# Patient Record
Sex: Female | Born: 1950 | Race: White | Hispanic: No | Marital: Married | Smoking: Former smoker
Health system: Southern US, Community
[De-identification: ages and names within clinical notes are randomized; demographics above are authoritative.]

## PROBLEM LIST (undated history)

## (undated) DIAGNOSIS — I447 Left bundle-branch block, unspecified: Secondary | ICD-10-CM

## (undated) DIAGNOSIS — E039 Hypothyroidism, unspecified: Secondary | ICD-10-CM

## (undated) DIAGNOSIS — Z9289 Personal history of other medical treatment: Secondary | ICD-10-CM

## (undated) DIAGNOSIS — I428 Other cardiomyopathies: Secondary | ICD-10-CM

## (undated) DIAGNOSIS — I5022 Chronic systolic (congestive) heart failure: Secondary | ICD-10-CM

## (undated) DIAGNOSIS — I951 Orthostatic hypotension: Secondary | ICD-10-CM

## (undated) DIAGNOSIS — F32A Depression, unspecified: Secondary | ICD-10-CM

## (undated) DIAGNOSIS — J189 Pneumonia, unspecified organism: Secondary | ICD-10-CM

## (undated) DIAGNOSIS — F329 Major depressive disorder, single episode, unspecified: Secondary | ICD-10-CM

## (undated) DIAGNOSIS — K759 Inflammatory liver disease, unspecified: Secondary | ICD-10-CM

## (undated) DIAGNOSIS — C8190 Hodgkin lymphoma, unspecified, unspecified site: Secondary | ICD-10-CM

## (undated) DIAGNOSIS — H9190 Unspecified hearing loss, unspecified ear: Secondary | ICD-10-CM

## (undated) DIAGNOSIS — D334 Benign neoplasm of spinal cord: Secondary | ICD-10-CM

## (undated) DIAGNOSIS — K219 Gastro-esophageal reflux disease without esophagitis: Secondary | ICD-10-CM

## (undated) HISTORY — PX: KNEE ARTHROSCOPY: SHX127

## (undated) HISTORY — DX: Other cardiomyopathies: I42.8

## (undated) HISTORY — DX: Orthostatic hypotension: I95.1

## (undated) HISTORY — PX: LAPAROSCOPIC CHOLECYSTECTOMY: SUR755

## (undated) HISTORY — PX: HYSTEROSCOPY W/D&C: SHX1775

## (undated) HISTORY — PX: DILATION AND CURETTAGE OF UTERUS: SHX78

## (undated) HISTORY — DX: Benign neoplasm of spinal cord: D33.4

## (undated) HISTORY — PX: COLONOSCOPY W/ POLYPECTOMY: SHX1380

## (undated) HISTORY — PX: BACK SURGERY: SHX140

---
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
## 1973-05-02 HISTORY — PX: ABDOMINAL EXPLORATION SURGERY: SHX538

## 1973-05-02 HISTORY — PX: NECK LESION BIOPSY: SHX2078

## 1998-06-25 ENCOUNTER — Ambulatory Visit (HOSPITAL_COMMUNITY): Admission: RE | Admit: 1998-06-25 | Discharge: 1998-06-25 | Payer: Self-pay | Admitting: Gynecology

## 1998-11-17 ENCOUNTER — Other Ambulatory Visit: Admission: RE | Admit: 1998-11-17 | Discharge: 1998-11-17 | Payer: Self-pay | Admitting: Gynecology

## 1998-12-15 ENCOUNTER — Observation Stay (HOSPITAL_COMMUNITY): Admission: RE | Admit: 1998-12-15 | Discharge: 1998-12-16 | Payer: Self-pay | Admitting: Gynecology

## 1999-12-10 ENCOUNTER — Encounter: Admission: RE | Admit: 1999-12-10 | Discharge: 1999-12-10 | Payer: Self-pay | Admitting: Internal Medicine

## 1999-12-10 ENCOUNTER — Encounter: Payer: Self-pay | Admitting: Internal Medicine

## 2000-10-23 ENCOUNTER — Inpatient Hospital Stay (HOSPITAL_COMMUNITY): Admission: EM | Admit: 2000-10-23 | Discharge: 2000-10-25 | Payer: Self-pay | Admitting: *Deleted

## 2000-10-23 ENCOUNTER — Encounter: Payer: Self-pay | Admitting: Emergency Medicine

## 2000-10-24 ENCOUNTER — Encounter: Payer: Self-pay | Admitting: Cardiology

## 2000-10-24 ENCOUNTER — Encounter: Payer: Self-pay | Admitting: Emergency Medicine

## 2000-11-30 ENCOUNTER — Other Ambulatory Visit: Admission: RE | Admit: 2000-11-30 | Discharge: 2000-11-30 | Payer: Self-pay | Admitting: Gynecology

## 2000-12-20 ENCOUNTER — Encounter: Payer: Self-pay | Admitting: Gynecology

## 2000-12-20 ENCOUNTER — Encounter: Admission: RE | Admit: 2000-12-20 | Discharge: 2000-12-20 | Payer: Self-pay | Admitting: Gynecology

## 2001-01-09 ENCOUNTER — Inpatient Hospital Stay (HOSPITAL_COMMUNITY): Admission: EM | Admit: 2001-01-09 | Discharge: 2001-01-15 | Payer: Self-pay | Admitting: Emergency Medicine

## 2001-01-09 ENCOUNTER — Encounter: Payer: Self-pay | Admitting: Internal Medicine

## 2001-01-10 ENCOUNTER — Encounter: Payer: Self-pay | Admitting: Internal Medicine

## 2001-07-19 ENCOUNTER — Encounter: Admission: RE | Admit: 2001-07-19 | Discharge: 2001-07-19 | Payer: Self-pay | Admitting: Internal Medicine

## 2001-07-19 ENCOUNTER — Encounter: Payer: Self-pay | Admitting: Internal Medicine

## 2001-07-26 ENCOUNTER — Encounter: Payer: Self-pay | Admitting: Internal Medicine

## 2001-07-26 ENCOUNTER — Encounter: Admission: RE | Admit: 2001-07-26 | Discharge: 2001-07-26 | Payer: Self-pay | Admitting: Internal Medicine

## 2001-09-17 ENCOUNTER — Inpatient Hospital Stay (HOSPITAL_COMMUNITY): Admission: EM | Admit: 2001-09-17 | Discharge: 2001-09-19 | Payer: Self-pay | Admitting: Emergency Medicine

## 2001-09-17 ENCOUNTER — Encounter: Payer: Self-pay | Admitting: Internal Medicine

## 2002-03-15 ENCOUNTER — Encounter: Admission: RE | Admit: 2002-03-15 | Discharge: 2002-03-15 | Payer: Self-pay | Admitting: Gynecology

## 2002-03-15 ENCOUNTER — Encounter: Payer: Self-pay | Admitting: Gynecology

## 2002-04-15 ENCOUNTER — Other Ambulatory Visit: Admission: RE | Admit: 2002-04-15 | Discharge: 2002-04-15 | Payer: Self-pay | Admitting: Obstetrics and Gynecology

## 2002-04-29 ENCOUNTER — Encounter: Payer: Self-pay | Admitting: Obstetrics and Gynecology

## 2002-04-29 ENCOUNTER — Ambulatory Visit (HOSPITAL_COMMUNITY): Admission: RE | Admit: 2002-04-29 | Discharge: 2002-04-29 | Payer: Self-pay | Admitting: Obstetrics and Gynecology

## 2002-05-07 ENCOUNTER — Encounter: Payer: Self-pay | Admitting: Obstetrics and Gynecology

## 2002-05-07 ENCOUNTER — Ambulatory Visit (HOSPITAL_COMMUNITY): Admission: RE | Admit: 2002-05-07 | Discharge: 2002-05-07 | Payer: Self-pay | Admitting: Obstetrics and Gynecology

## 2002-06-26 ENCOUNTER — Encounter (INDEPENDENT_AMBULATORY_CARE_PROVIDER_SITE_OTHER): Payer: Self-pay

## 2002-06-26 ENCOUNTER — Ambulatory Visit (HOSPITAL_COMMUNITY): Admission: RE | Admit: 2002-06-26 | Discharge: 2002-06-26 | Payer: Self-pay | Admitting: Obstetrics and Gynecology

## 2002-07-08 ENCOUNTER — Ambulatory Visit (HOSPITAL_COMMUNITY): Admission: RE | Admit: 2002-07-08 | Discharge: 2002-07-08 | Payer: Self-pay | Admitting: Gastroenterology

## 2002-07-08 ENCOUNTER — Encounter (INDEPENDENT_AMBULATORY_CARE_PROVIDER_SITE_OTHER): Payer: Self-pay | Admitting: *Deleted

## 2003-03-19 ENCOUNTER — Encounter: Admission: RE | Admit: 2003-03-19 | Discharge: 2003-03-19 | Payer: Self-pay | Admitting: Obstetrics and Gynecology

## 2003-04-11 ENCOUNTER — Ambulatory Visit (HOSPITAL_BASED_OUTPATIENT_CLINIC_OR_DEPARTMENT_OTHER): Admission: RE | Admit: 2003-04-11 | Discharge: 2003-04-11 | Payer: Self-pay | Admitting: Orthopedic Surgery

## 2003-04-11 ENCOUNTER — Encounter (INDEPENDENT_AMBULATORY_CARE_PROVIDER_SITE_OTHER): Payer: Self-pay | Admitting: *Deleted

## 2003-04-11 ENCOUNTER — Ambulatory Visit (HOSPITAL_COMMUNITY): Admission: RE | Admit: 2003-04-11 | Discharge: 2003-04-11 | Payer: Self-pay | Admitting: Orthopedic Surgery

## 2003-05-09 ENCOUNTER — Other Ambulatory Visit: Admission: RE | Admit: 2003-05-09 | Discharge: 2003-05-09 | Payer: Self-pay | Admitting: Obstetrics and Gynecology

## 2004-04-08 ENCOUNTER — Encounter: Admission: RE | Admit: 2004-04-08 | Discharge: 2004-04-08 | Payer: Self-pay | Admitting: Obstetrics and Gynecology

## 2004-05-25 ENCOUNTER — Other Ambulatory Visit: Admission: RE | Admit: 2004-05-25 | Discharge: 2004-05-25 | Payer: Self-pay | Admitting: Obstetrics and Gynecology

## 2004-08-06 ENCOUNTER — Emergency Department (HOSPITAL_COMMUNITY): Admission: EM | Admit: 2004-08-06 | Discharge: 2004-08-06 | Payer: Self-pay | Admitting: *Deleted

## 2005-05-06 ENCOUNTER — Encounter: Admission: RE | Admit: 2005-05-06 | Discharge: 2005-05-06 | Payer: Self-pay | Admitting: Obstetrics and Gynecology

## 2005-05-27 ENCOUNTER — Other Ambulatory Visit: Admission: RE | Admit: 2005-05-27 | Discharge: 2005-05-27 | Payer: Self-pay | Admitting: Obstetrics and Gynecology

## 2005-10-14 ENCOUNTER — Encounter: Admission: RE | Admit: 2005-10-14 | Discharge: 2005-10-14 | Payer: Self-pay | Admitting: Internal Medicine

## 2006-05-08 ENCOUNTER — Encounter: Admission: RE | Admit: 2006-05-08 | Discharge: 2006-05-08 | Payer: Self-pay | Admitting: Obstetrics and Gynecology

## 2006-06-30 ENCOUNTER — Ambulatory Visit: Payer: Self-pay | Admitting: Oncology

## 2006-08-01 ENCOUNTER — Emergency Department (HOSPITAL_COMMUNITY): Admission: EM | Admit: 2006-08-01 | Discharge: 2006-08-01 | Payer: Self-pay | Admitting: Emergency Medicine

## 2007-05-15 ENCOUNTER — Encounter: Admission: RE | Admit: 2007-05-15 | Discharge: 2007-05-15 | Payer: Self-pay | Admitting: Obstetrics and Gynecology

## 2008-05-20 ENCOUNTER — Encounter: Admission: RE | Admit: 2008-05-20 | Discharge: 2008-05-20 | Payer: Self-pay | Admitting: Internal Medicine

## 2009-05-21 ENCOUNTER — Encounter: Admission: RE | Admit: 2009-05-21 | Discharge: 2009-05-21 | Payer: Self-pay | Admitting: Internal Medicine

## 2010-03-15 ENCOUNTER — Encounter
Admission: RE | Admit: 2010-03-15 | Discharge: 2010-03-15 | Payer: Self-pay | Source: Home / Self Care | Attending: Obstetrics and Gynecology | Admitting: Obstetrics and Gynecology

## 2010-05-24 ENCOUNTER — Encounter
Admission: RE | Admit: 2010-05-24 | Discharge: 2010-05-24 | Payer: Self-pay | Source: Home / Self Care | Attending: Internal Medicine | Admitting: Internal Medicine

## 2010-09-17 NOTE — H&P (Signed)
NAME:  Sylvia Moore, ROHMAN                         ACCOUNT NO.:  [id number]   MEDICAL RECORD NO.:  [id number]                   PATIENT TYPE:  AMB   LOCATION:  SDC                                  FACILITY:  [facility]   PHYSICIAN:  Naima A. Dillard, M.D.              DATE OF BIRTH:  26-Sep-1950   DATE OF ADMISSION:  DATE OF DISCHARGE:                                HISTORY & PHYSICAL   CHIEF COMPLAINT:  Postmenopausal vaginal bleeding.   HISTORY OF PRESENT ILLNESS:  The patient is a 60 year old white female  gravida 0 who presented to me April 15, 2002 complaining of intermittent  bleeding for the last couple of weeks and stated that she also had a past  six months prior.  The patient states she has been on Prometrium and Climara  for about three to four years and has not missed any doses, which were given  to her by a prior Ob-Gyn.  The patient states she had an endometrial biopsy,  but does not know the results.  The patient also states that she tried to  have a D&C hysteroscopy, but it was unsuccessful due to cervical stenosis.  The patient has a sonohystogram, which was significant for a 7 mm  endometrial polyp at the fundus.   PAST MEDICAL HISTORY:  1. Congestive heart failure.  2. Depression.  3. Cardiomyopathy.  4. The patient had Hodgkin's disease and received radiation; she has been in     remission since 1975, and also;  5. Hypothyroidism.   MEDICATIONS:  1. Digoxin.  2. Effexor.  3. Coreg.  4. Provera.  5. Synthroid.   PAST SURGICAL HISTORY:  Past surgical history is significant for abdominal  surgery with lymph node biopsy with check of her ovaries and ovarian  cellularity, but the patient did have some ovarian cellularity resultant  from the radiation.   PAST GYNECOLOGICAL HISTORY:  The patient has a history of carcinoma in situ  of the vulva.  There were clear margins after the incision.  No history of  abnormal Pap smear.  She is in a lesbian relationship.  The  female partner  does not have intercourse with males.  The patient has been on hormone  replacement since the mid 1980s.  The patient states that she does have  fibroids, but denies having any new medications.  Does have some occasional  cramping and denies any increased stress in her life.   SOCIAL HISTORY:  Social history is negative times three.   FAMILY HISTORY:  Family history is significant for high blood pressure.   REVIEW OF SYSTEMS:  ENDOCRINE:  As stated the patient has hypothyroidism.  CARDIOVASCULAR:  History of CHF.  GASTROINTESTINAL:  Unremarkable.  GENITOURINARY:  As above.  MUSCULOSKELETAL:  Unremarkable.  PSYCHIATRIC:  Significant for depression.   PHYSICAL EXAMINATION:  VITAL SIGNS:  On physical exam the patient's blood  pressure is 118/83.  Weight 211 pounds.  HEENT:  Pupils are equal.  Hearing is normal.  Throat is clear.  NECK:  Thyroid is not enlarged.  HEART:  Regular rate and rhythm.  LUNGS:  Lungs are clear to auscultation bilaterally.  BREASTS:  Breasts have no masses, discharge, skin changes or nipple  retraction bilaterally.  BACK:  The back has no CVA tenderness bilaterally.  ABDOMEN:  Abdomen is nontender without any masses or organomegaly.  EXTREMITIES:  Extremities have no cyanosis, clubbing or edema.  NEUROLOGIC:  Neuro exam is within normal limits.  PELVIC:  Vulvovaginal exam is within normal limits.  Cervix is nontender  without any lesions, but mild cervical stenosis.  Adnexa has no masses.  Endometrial biopsy was significant for focal cervical hyperplasia without  atypia.   ASSESSMENT:  Postmenopausal vaginal bleeding with endometrial polyps.   PLAN:  D&C and hysteroscopy.  Laminaria were placed today.  We will do a D&C  and hysteroscopy.  The patient was explained the risk are, but limited to  bleeding, infection, damage to internal organs such as bowel and bladder, or  perforation of he uterus.  The patient is currently taking Provera 20  mg  times 10 days for the bleeding.                                                 Naima A. Normand Sloop, M.D.    NAD/MEDQ  D:  06/25/2002  T:  06/26/2002  Job:  [id number]

## 2010-09-17 NOTE — Op Note (Signed)
   NAME:  Sylvia Moore, Sylvia Moore                         ACCOUNT NO.:  [id number]   MEDICAL RECORD NO.:  [id number]                   PATIENT TYPE:  AMB   LOCATION:  SDC                                  [facility]   PHYSICIAN:  Naima A. Dillard, M.D.              DATE OF BIRTH:  23-Apr-1951   DATE OF PROCEDURE:  06/26/2002  DATE OF DISCHARGE:                                 OPERATIVE REPORT   PREOPERATIVE DIAGNOSES:  1. Endometrial fundal polyp.  2. Postmenopausal vaginal bleeding.   POSTOPERATIVE DIAGNOSES:  Postmenopausal vaginal bleeding.   PROCEDURE:  1. Dilatation and curettage.  2. Hysteroscopy.   SURGEON:  Naima A. Normand Sloop, M.D.   ANESTHESIA:  MAC and 20 mL 1% Xylocaine cervical block.   ESTIMATED BLOOD LOSS:  Minimal.   IV FLUIDS:  600 mL of crystalloid.  She had a negative 50 mL deficit of 3%  sorbitol hysteroscopic fluid.   COMPLICATIONS:  None.   FINDINGS:  Uterus distended to 7 cm.  There were a mild amount of  endometrial curettings noted.  No submucosal fibroids or endometrial polyp  was visualized.   DISPOSITION:  The patient went to recovery room in stable condition.   PROCEDURE IN DETAIL:  The patient went to the operating room where she was  given MAC anesthesia, placed in the dorsal lithotomy position, and prepped  and draped in a normal sterile fashion.  Bladder was drained about 50 mL of  urine.  Bivalve speculum was placed into the vagina.  The anterior lip of  the cervix was grasped with a single tooth tenaculum.  Laminaria was  removed.  A 20 mL 1% lidocaine cervical block was placed.  The uterus was  further sounded to 21.  The hysteroscope was placed into the uterus.  Findings noted above were seen.  There were no polyps noted at the fundus of  the uterus.  Hysteroscope was removed.  A sharp curettage was done.  Moderate amount of endometrial curettings were obtained and sent to  pathology.  All instruments were removed from the vagina and cervix.   The  tenaculum site at cervix was noted to be hemostatic.  Sponge, lap, and  needle counts were correct x2.  The patient went to recovery room in stable  condition.                                               Naima A. Normand Sloop, M.D.    NAD/MEDQ  D:  06/26/2002  T:  06/26/2002  Job:  [id number]

## 2010-09-17 NOTE — Op Note (Signed)
NAME:  Sylvia Moore, Sylvia Moore                         ACCOUNT NO.:  [id number]   MEDICAL RECORD NO.:  [id number]                   PATIENT TYPE:  AMB   LOCATION:  ENDO                                 FACILITY:  [facility]   PHYSICIAN:  Anselmo Rod, M.D.               DATE OF BIRTH:  Sep 24, 1950   DATE OF PROCEDURE:  07/08/2002  DATE OF DISCHARGE:                                 OPERATIVE REPORT   PROCEDURE PERFORMED:  Colonoscopy with snare polypectomy x3.   ENDOSCOPIST:  Anselmo Rod, M.D.   INSTRUMENT USED:  Olympus video colonoscope.   INDICATIONS FOR PROCEDURE:  Rectal bleeding and personal history of  Hodgkin's disease treated in 7836 in a 60 year old white female.  Rule out  colonic polyps, masses, etc.   PREPROCEDURE PREPARATION:  Informed consent was procured from the patient.  The patient had fasted for eight hours prior to the procedure and was  prepped with a bottle of MiraLax and Gatorade the night prior to the  procedure.   PREPROCEDURE PHYSICAL:  VITAL SIGNS:  The patient has stable vital signs.  NECK:  Supple.  CHEST:  Clear to auscultation, S1, S2 regular.  ABDOMEN:  Soft with normal bowel sounds.   DESCRIPTION OF PROCEDURE:  The patient was placed in the left lateral  decubitus position and sedated with 80 mg of Demerol and 8 mg of Versed  intravenously.  Once the patient was adequately sedated and maintained on  low-flow oxygen and continuous cardiac monitoring, the Olympus video  colonoscope was advanced from the rectum to the cecum with slight  difficulty.  There was some residual stool in the colon.  Multiple washings  were done.  Two small sessile polyps were snared from the proximal right  colon, one of them was overlying the ileocecal valve.  Another small sessile  polyp was snared from 55 cm.  Small internal and external hemorrhoids were  seen on retroflexion and anal inspection.  No large masses were present.  There was no evidence of diverticulosis.   The procedure was completed up to  the cecum.  The appendiceal orifice and the ileocecal valve were clearly  visualized and photographed.   IMPRESSION:  1. Three colonic polyps removed (see description above), two from the     proximal right colon and one from 55 cm.  2. Small, non-bleeding internal and external hemorrhoids.   RECOMMENDATIONS:  1. Await pathology results.  2.     Avoid all nonsteroidals including aspirin for now.  3. High-fiber diet has been recommended along with liberal fluid intake.  4. Outpatient follow-up in the next two weeks for further recommendation.  Anselmo Rod, M.D.    JNM/MEDQ  D:  07/08/2002  T:  07/09/2002  Job:  [id number]   cc:   Naima A. Normand Sloop, M.D.  [address]  [place]  [place] [address]  Fax: [phone]

## 2010-09-17 NOTE — Op Note (Signed)
NAME:  Sylvia Moore, Sylvia Moore                         ACCOUNT NO.:  [id number]   MEDICAL RECORD NO.:  [id number]                   PATIENT TYPE:  AMB   LOCATION:  DSC                                  FACILITY:  [facility]   PHYSICIAN:  Katy Fitch. Naaman Plummer., M.D.          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   PROCEDURE:  Excision/curettage of pyogenic granuloma type lesion dorsal  aspect of right ring finger PIP joint with routine pathology for H&E  specimen and aerobic culture.   SURGEON:  Katy Fitch. Sypher, M.D.   ASSISTANT:  Jonni Sanger, P.A.   ANESTHESIA:  2% lidocaine metacarpal head level block right ring finger, no  supplemental sedation.  Supervising anesthetist, Dr. Teressa Senter.   INDICATIONS FOR PROCEDURE:  The patient is a 60 year old woman referred for  evaluation and management of a chronic bleeding lesion on the dorsal aspect  of her right ring finger PIP joint.  She was seen for evaluation in the  office and noted to have a lesion consistent with a chronic pyogenic  granuloma.  She had injured her finger approximately two months prior.   We recommended excision in the minor operating room.   DESCRIPTION OF PROCEDURE:  The patient is brought to the operating room and  placed in the supine position on the operating table.  Following informed  consent, 0.25% Marcaine and 2% lidocaine were infiltrated at the metacarpal  head level to obtain a digital block.  When anesthesia was satisfactory, the  finger was prepped with Betadine soap and solution including the entire hand  to the  proximal forearm.  A pneumatic tourniquet was applied to the proximal  brachium.   Following exsanguination of the finger with a gauze wrap, a 1/2 inch Penrose  drain was placed over the P1 segment as a digital tourniquet.   The granuloma was ellipsed with scalpel incision and removed with a rongeur  and curets.  A portion of the granuloma was sent for pathology and a portion  was sent for routine aerobic culture.   The wound was curetted down to normal appearing tissue including the  extensor tendon.  The tourniquet was then released and bleeding controlled  by direct pressure.  The wound was then dressed with Xeroflow, sterile  gauze, and Coban.  There were no apparent complications.   The patient tolerated the surgery and anesthesia well.  She was transferred  to the recovery room with stable vital signs.  She will be discharged with a  prescription for Tylenol for pain.  We  will not provide antibiotics pending  culture results.                                               Katy Fitch Naaman Plummer., M.D.    RVS/MEDQ  D:  04/11/2003  T:  04/12/2003  Job:  [id number]   cc:   Ria Bush. Jorja Loa, M.D.  [address]  [place]  [place] [address]  Fax: [phone]

## 2010-10-19 ENCOUNTER — Encounter (HOSPITAL_COMMUNITY)
Admission: RE | Admit: 2010-10-19 | Discharge: 2010-10-19 | Disposition: A | Discharge status: Home / Self Care | Payer: BC Managed Care – PPO | Source: Ambulatory Visit | Attending: Obstetrics and Gynecology | Admitting: Obstetrics and Gynecology

## 2010-10-19 LAB — CBC
HCT: 40.2 % (ref 36.0–46.0)
MCHC: 34.6 g/dL (ref 30.0–36.0)
RDW: 12 % (ref 11.5–15.5)
WBC: 10.9 10*3/uL — ABNORMAL HIGH (ref 4.0–10.5)

## 2010-10-19 LAB — BASIC METABOLIC PANEL
BUN: 17 mg/dL (ref 6–23)
Calcium: 10.1 mg/dL (ref 8.4–10.5)
Chloride: 99 mEq/L (ref 96–112)
Creatinine, Ser: 0.77 mg/dL (ref 0.50–1.10)
GFR calc Af Amer: >60 mL/min (ref >60)

## 2010-10-26 ENCOUNTER — Other Ambulatory Visit: Payer: Self-pay | Admitting: Obstetrics and Gynecology

## 2010-10-26 ENCOUNTER — Ambulatory Visit (HOSPITAL_COMMUNITY)
Admission: RE | Admit: 2010-10-26 | Discharge: 2010-10-26 | Disposition: A | Discharge status: Home / Self Care | Payer: BC Managed Care – PPO | Source: Ambulatory Visit | Attending: Obstetrics and Gynecology | Admitting: Obstetrics and Gynecology

## 2010-10-26 DIAGNOSIS — Z01812 Encounter for preprocedural laboratory examination: Secondary | ICD-10-CM | POA: Insufficient documentation

## 2010-10-26 DIAGNOSIS — N950 Postmenopausal bleeding: Secondary | ICD-10-CM | POA: Insufficient documentation

## 2010-10-26 DIAGNOSIS — N840 Polyp of corpus uteri: Secondary | ICD-10-CM | POA: Insufficient documentation

## 2010-10-26 DIAGNOSIS — Z01818 Encounter for other preprocedural examination: Secondary | ICD-10-CM | POA: Insufficient documentation

## 2010-11-03 NOTE — H&P (Signed)
  NAME:  Sylvia Moore, Sylvia Moore               ACCOUNT NO.:  [id number]  MEDICAL RECORD NO.:  [id number]  LOCATION:  [facility]                          [facility]  PHYSICIAN:  Mattye Verdone A. Assyria Morreale, M.D. DATE OF BIRTH:  Dec 09, 1950  DATE OF ADMISSION:  10/26/2010 DATE OF DISCHARGE:                             HISTORY & PHYSICAL   The patient is a 60 year old female who presented with postmenopausal vaginal bleeding.  In January 2012, she had a sonohysterogram done in late January which was significant for polyps and a submucosal fibroid. The patient was given option of a D and C, hysteroscopy versus Provera for 3 months.  The patient decided to do Provera for 3 months.  She had a repeat sonohysterogram, which was significant for uterus measuring 7.49 x 6.04, x 6.3 with normal ovaries.  The posterior fibroid which is 5.19 cm was still seen and she has had a small endometrial mass consistent with polyp measuring 0.7 x 0.6.  The patient decided since the polyp did not resolve she wants to do a D and C, hysteroscopy, and polypectomy.  She has not had any further vaginal bleeding.  She did have some withdrawal bleeding to the Provera.  CURRENT MEDICATIONS: 1. Nitroglycerin as needed. 2. Carvedilol 6.25 mg tablet with food twice a day. 3. Diovan 80 mg each day. 4. Synthroid 125 mcg every day. 5. Effexor 75 mg each day. 6. Xanax as needed.  PAST MEDICAL HISTORY:  Significant for chronic systolic CHF secondary to cardiomyopathy and Hodgkin lymphoma. 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  She has a history of depression, T3-T4 spinal schwannoma, hypothyroidism, and iatrogenic hypotension.  She has some hearing loss.  SOCIAL HISTORY:  The patient was not smoking in the past.  She smoked for about 6 months while in college.  She has 1-2 drinks per week.  She is employed as a Hydrologist.  She is allergic to CODEINE which causes vomiting.  REVIEW OF SYSTEMS:  CARDIAC:  Negative  for chest pain or palpitations. GI:  There is no nausea or vomiting.  GENITOURINARY:  Significant for postmenopausal vaginal bleeding.  No myalgias.  No muscle weakness.  PHYSICAL EXAMINATION:  VITAL SIGNS:  The patient was 226 pounds.  Blood pressure was 100/70. HEENT:  Her pupils are equal.  Her hearing is decreased. NECK:  Thyroid is not enlarged. HEART:  Regular rate and rhythm. LUNGS:  Clear to auscultation bilaterally. ABDOMEN:  Nontender without any mass or organomegaly. EXTREMITIES:  There is no cyanosis, clubbing, or edema. NEURO:  Within normal limits. GENITOURINARY:  Full vaginal exam is within normal limits.  Vagina and cervix are atrophic without any lesions.  Uterus is normal shape, size, consistency, and nontender.  Adnexa has no masses.  LABORATORY DATA:  As above.  The patient decided for D and C, hysteroscopy.  She understands the risks of but not limited to bleeding, infection, damage to internal organs, and perforation of the uterus.     Duward Allbritton A. Normand Sloop, M.D.     NAD/MEDQ  D:  10/26/2010  T:  10/27/2010  Job:  [id number]  Electronically Signed by Jaymes Graff M.D. on 11/03/2010 01:02:26 PM

## 2010-11-03 NOTE — Op Note (Signed)
  NAME:  Sylvia Moore, Sylvia Moore               ACCOUNT NO.:  [id number]  MEDICAL RECORD NO.:  [id number]  LOCATION:  [facility]                          [facility]ILITY[facility]  PHYSICIAN:  Makaela Cando A. Kalii Chesmore, M.D. DATE OF BIRTH:  May 09, 1950  DATE OF PROCEDURE:  10/26/2010 DATE OF DISCHARGE:                              OPERATIVE REPORT   PREOPERATIVE DIAGNOSES:  Postmenopausal vaginal bleeding and endometrial polyp.  POSTOPERATIVE DIAGNOSES:  Postmenopausal vaginal bleeding and endometrial polyp.  PROCEDURE:  D and C hysteroscopy and polypectomy.  SURGEON:  Lavayah Vita A. Jaylynn Mcaleer, MD  ASSISTANTS:  There are no assistants.  ANESTHESIA:  General and local.  FINDINGS:  Two small polyps and atrophic endometrium.  SPECIMENS:  Endometrial polyps and endometrial curetting sent to Pathology.  ESTIMATED BLOOD LOSS:  Minimal.  There was a 230 mL deficit of 1.5% glycine at the end of procedure. There were no complications.  The patient went to PACU in stable condition.  PROCEDURE IN DETAIL:  The patient was taken to the operating room where she was given anesthesia and placed in dorsal lithotomy position and prepped and draped in a normal sterile fashion.  In-and-out catheter was used to drain the bladder.  The patient was examined and noted to have anteverted normal size uterus with no adnexal masses.  A bivalve speculum placed into the vagina.  Anterior lip of the cervix was grasped with single-tooth tenaculum.  Cervix was dilated with Pratt dilators up to 21.  Hysteroscope was placed into the cavity and there were 2 polyps seen right near both ostia with hysteroscope.  The cervix was then further dilated with Shawnie Pons dilators up to 31.  Resectoscope was then placed into the uterus.  It was difficult to see with the resectoscope though, for some reason it was cloudy and continued to re-adjust.  I could see some of the polyp which I did remove without heat but still because of difficult to see, I went back to  the 21 to the smaller hysteroscope and I used the uterine polyp forceps to remove the polyps. It was easier to do since she was so far dilated.  I then did a sharp curettage until a gritty texture was felt on all 4 walls of the uterus. I then replaced a hysteroscope back into the uterine cavity.  There was no signs of perforation.  The polyps had been removed.  There was a scanty amount of endometrium which was seen before and after the procedure.  All instruments were removed from the uterus and cervix. The tenaculum site was hemostatic.  Sponge, lap, and needle counts were correct.  The patient went to recovery room in stable condition.     Sylvia Moore, M.D.     NAD/MEDQ  D:  10/26/2010  T:  10/27/2010  Job:  [id number]  Electronically Signed by Jaymes Graff M.D. on 11/03/2010 01:02:38 PM

## 2011-04-06 ENCOUNTER — Other Ambulatory Visit: Payer: Self-pay | Admitting: Internal Medicine

## 2011-04-06 DIAGNOSIS — R109 Unspecified abdominal pain: Secondary | ICD-10-CM

## 2011-04-07 ENCOUNTER — Ambulatory Visit
Admission: RE | Admit: 2011-04-07 | Discharge: 2011-04-07 | Disposition: A | Discharge status: Home / Self Care | Payer: BC Managed Care – PPO | Source: Ambulatory Visit | Attending: Internal Medicine | Admitting: Internal Medicine

## 2011-04-07 DIAGNOSIS — R109 Unspecified abdominal pain: Secondary | ICD-10-CM

## 2011-04-07 MED ORDER — IOHEXOL 300 MG/ML  SOLN
125.0000 mL | Freq: Once | INTRAMUSCULAR | Status: AC | PRN
  Administered 2011-04-07: 125 mL via INTRAVENOUS

## 2011-06-07 ENCOUNTER — Other Ambulatory Visit: Payer: Self-pay | Admitting: Obstetrics and Gynecology

## 2011-06-07 DIAGNOSIS — Z1231 Encounter for screening mammogram for malignant neoplasm of breast: Secondary | ICD-10-CM

## 2011-06-15 ENCOUNTER — Ambulatory Visit
Admission: RE | Admit: 2011-06-15 | Discharge: 2011-06-15 | Disposition: A | Discharge status: Home / Self Care | Payer: BC Managed Care – PPO | Source: Ambulatory Visit | Attending: Obstetrics and Gynecology | Admitting: Obstetrics and Gynecology

## 2011-06-15 DIAGNOSIS — Z1231 Encounter for screening mammogram for malignant neoplasm of breast: Secondary | ICD-10-CM

## 2011-06-28 ENCOUNTER — Encounter (HOSPITAL_COMMUNITY): Payer: Self-pay | Admitting: Pharmacy Technician

## 2011-06-30 ENCOUNTER — Encounter (HOSPITAL_COMMUNITY)
Admission: RE | Admit: 2011-06-30 | Discharge: 2011-06-30 | Disposition: A | Discharge status: Home / Self Care | Payer: BC Managed Care – PPO | Source: Ambulatory Visit | Attending: Neurosurgery | Admitting: Neurosurgery

## 2011-06-30 ENCOUNTER — Other Ambulatory Visit: Payer: Self-pay

## 2011-06-30 ENCOUNTER — Encounter (HOSPITAL_COMMUNITY): Payer: Self-pay

## 2011-06-30 HISTORY — DX: Depression, unspecified: F32.A

## 2011-06-30 HISTORY — DX: Hypothyroidism, unspecified: E03.9

## 2011-06-30 HISTORY — DX: Major depressive disorder, single episode, unspecified: F32.9

## 2011-06-30 LAB — CBC
Hemoglobin: 14.2 g/dL (ref 12.0–15.0)
MCH: 33.2 pg (ref 26.0–34.0)
Platelets: 430 10*3/uL — ABNORMAL HIGH (ref 150–400)
RBC: 4.28 MIL/uL (ref 3.87–5.11)
WBC: 9.5 10*3/uL (ref 4.0–10.5)

## 2011-06-30 LAB — TYPE AND SCREEN

## 2011-06-30 LAB — DIFFERENTIAL
Lymphocytes Relative: 39 % (ref 12–46)
Lymphs Abs: 3.7 10*3/uL (ref 0.7–4.0)
Monocytes Relative: 13 % — ABNORMAL HIGH (ref 3–12)
Neutro Abs: 4.3 10*3/uL (ref 1.7–7.7)
Neutrophils Relative %: 45 % (ref 43–77)

## 2011-06-30 LAB — BASIC METABOLIC PANEL
Calcium: 10 mg/dL (ref 8.4–10.5)
GFR calc Af Amer: 81 mL/min — ABNORMAL LOW (ref >90)
GFR calc non Af Amer: 70 mL/min — ABNORMAL LOW (ref >90)
Glucose, Bld: 113 mg/dL — ABNORMAL HIGH (ref 70–99)
Potassium: 4.2 mEq/L (ref 3.5–5.1)
Sodium: 135 mEq/L (ref 135–145)

## 2011-06-30 LAB — SURGICAL PCR SCREEN: Staphylococcus aureus: NEGATIVE

## 2011-06-30 NOTE — Pre-Procedure Instructions (Signed)
20 Sylvia Moore  06/30/2011   Your procedure is scheduled on:  07/04/11  Report to [facility] Stay [facility] at   Call this number if you have problems the morning of surgery: [phone]   Remember:   Do not eat food:After Midnight.  May have clear liquids: up to 4 Hours before arrival.  Clear liquids include soda, tea, black coffee, apple or grape juice, broth.  Take these medicines the morning of surgery with A SIP OF WATER: allopurinol,coreg,synthroid,diovan,effexor   Do not wear jewelry, make-up or nail polish.  Do not wear lotions, powders, or perfumes. You may wear deodorant.  Do not shave 48 hours prior to surgery.  Do not bring valuables to the hospital.  Contacts, dentures or bridgework may not be worn into surgery.  Leave suitcase in the car. After surgery it may be brought to your room.  For patients admitted to the hospital, checkout time is 11:00 AM the day of discharge.   Patients discharged the day of surgery will not be allowed to drive home.  Name and phone number of your driver: family  Special Instructions: CHG Shower Use Special Wash: 1/2 bottle night before surgery and 1/2 bottle morning of surgery.   Please read over the following fact sheets that you were given: Pain Booklet, Coughing and Deep Breathing, Blood Transfusion Information, MRSA Information and Surgical Site Infection Prevention

## 2011-06-30 NOTE — Progress Notes (Signed)
Dr Renetta Chalk for echo, ekg.

## 2011-07-01 HISTORY — PX: LUMBAR FUSION: SHX111

## 2011-07-01 LAB — ABO/RH: ABO/RH(D): A POS

## 2011-07-03 MED ORDER — CEFAZOLIN SODIUM 1-5 GM-% IV SOLN
1.0000 g | INTRAVENOUS | Status: AC
  Administered 2011-07-04: 2 g via INTRAVENOUS
  Filled 2011-07-03: qty 50

## 2011-07-04 ENCOUNTER — Encounter (HOSPITAL_COMMUNITY): Payer: Self-pay | Admitting: *Deleted

## 2011-07-04 ENCOUNTER — Encounter (HOSPITAL_COMMUNITY): Payer: Self-pay | Admitting: Anesthesiology

## 2011-07-04 ENCOUNTER — Ambulatory Visit (HOSPITAL_COMMUNITY): Payer: BC Managed Care – PPO

## 2011-07-04 ENCOUNTER — Ambulatory Visit (HOSPITAL_COMMUNITY): Payer: BC Managed Care – PPO | Admitting: Anesthesiology

## 2011-07-04 ENCOUNTER — Encounter (HOSPITAL_COMMUNITY)
Admission: RE | Disposition: A | Discharge status: Home / Self Care | Payer: Self-pay | Source: Ambulatory Visit | Attending: Neurosurgery

## 2011-07-04 ENCOUNTER — Inpatient Hospital Stay (HOSPITAL_COMMUNITY)
Admission: RE | Admit: 2011-07-04 | Discharge: 2011-07-06 | DRG: 755 | Disposition: A | Discharge status: Home / Self Care | Payer: BC Managed Care – PPO | Source: Ambulatory Visit | Attending: Neurosurgery | Admitting: Neurosurgery

## 2011-07-04 DIAGNOSIS — I10 Essential (primary) hypertension: Secondary | ICD-10-CM | POA: Diagnosis present

## 2011-07-04 DIAGNOSIS — F329 Major depressive disorder, single episode, unspecified: Secondary | ICD-10-CM | POA: Diagnosis present

## 2011-07-04 DIAGNOSIS — Z79899 Other long term (current) drug therapy: Secondary | ICD-10-CM

## 2011-07-04 DIAGNOSIS — Z923 Personal history of irradiation: Secondary | ICD-10-CM

## 2011-07-04 DIAGNOSIS — M4000 Postural kyphosis, site unspecified: Secondary | ICD-10-CM | POA: Diagnosis present

## 2011-07-04 DIAGNOSIS — I428 Other cardiomyopathies: Secondary | ICD-10-CM | POA: Diagnosis present

## 2011-07-04 DIAGNOSIS — R296 Repeated falls: Secondary | ICD-10-CM | POA: Diagnosis present

## 2011-07-04 DIAGNOSIS — Z9221 Personal history of antineoplastic chemotherapy: Secondary | ICD-10-CM

## 2011-07-04 DIAGNOSIS — Z8571 Personal history of Hodgkin lymphoma: Secondary | ICD-10-CM

## 2011-07-04 DIAGNOSIS — F3289 Other specified depressive episodes: Secondary | ICD-10-CM | POA: Diagnosis present

## 2011-07-04 DIAGNOSIS — Z01812 Encounter for preprocedural laboratory examination: Secondary | ICD-10-CM

## 2011-07-04 DIAGNOSIS — E039 Hypothyroidism, unspecified: Secondary | ICD-10-CM | POA: Diagnosis present

## 2011-07-04 DIAGNOSIS — S22009A Unspecified fracture of unspecified thoracic vertebra, initial encounter for closed fracture: Principal | ICD-10-CM | POA: Diagnosis present

## 2011-07-04 SURGERY — POSTERIOR LUMBAR FUSION 2 LEVEL
Anesthesia: General | Site: Back | Wound class: Clean

## 2011-07-04 MED ORDER — HYDROMORPHONE HCL PF 1 MG/ML IJ SOLN
INTRAMUSCULAR | Status: AC
  Filled 2011-07-04: qty 1

## 2011-07-04 MED ORDER — HYDROMORPHONE HCL PF 1 MG/ML IJ SOLN: 0.2500 mg | INTRAMUSCULAR | Status: DC | PRN

## 2011-07-04 MED ORDER — CARVEDILOL 12.5 MG PO TABS
12.5000 mg | ORAL_TABLET | Freq: Two times a day (BID) | ORAL | Status: DC
  Administered 2011-07-04 – 2011-07-06 (×4): 12.5 mg via ORAL
  Filled 2011-07-04 (×6): qty 1

## 2011-07-04 MED ORDER — ONDANSETRON HCL 4 MG/2ML IJ SOLN: 4.0000 mg | Freq: Once | INTRAMUSCULAR | Status: DC | PRN

## 2011-07-04 MED ORDER — EPHEDRINE SULFATE 50 MG/ML IJ SOLN
INTRAMUSCULAR | Status: DC | PRN
  Administered 2011-07-04: 5 mg via INTRAVENOUS
  Administered 2011-07-04: 10 mg via INTRAVENOUS
  Administered 2011-07-04: 5 mg via INTRAVENOUS
  Administered 2011-07-04 (×2): 10 mg via INTRAVENOUS

## 2011-07-04 MED ORDER — ACETAMINOPHEN 325 MG PO TABS: 650.0000 mg | ORAL_TABLET | ORAL | Status: DC | PRN

## 2011-07-04 MED ORDER — ALLOPURINOL 100 MG PO TABS
100.0000 mg | ORAL_TABLET | Freq: Two times a day (BID) | ORAL | Status: DC
  Administered 2011-07-04 – 2011-07-06 (×4): 100 mg via ORAL
  Filled 2011-07-04 (×5): qty 1

## 2011-07-04 MED ORDER — MORPHINE SULFATE 2 MG/ML IJ SOLN: 0.0500 mg/kg | INTRAMUSCULAR | Status: DC | PRN

## 2011-07-04 MED ORDER — BUPIVACAINE HCL (PF) 0.25 % IJ SOLN
INTRAMUSCULAR | Status: DC | PRN
  Administered 2011-07-04: 30 mL

## 2011-07-04 MED ORDER — ZOLPIDEM TARTRATE 5 MG PO TABS: 5.0000 mg | ORAL_TABLET | Freq: Every evening | ORAL | Status: DC | PRN

## 2011-07-04 MED ORDER — PROPOFOL 10 MG/ML IV EMUL
INTRAVENOUS | Status: DC | PRN
  Administered 2011-07-04: 120 mg via INTRAVENOUS

## 2011-07-04 MED ORDER — SODIUM CHLORIDE 0.9 % IJ SOLN: 3.0000 mL | INTRAMUSCULAR | Status: DC | PRN

## 2011-07-04 MED ORDER — BISACODYL 10 MG RE SUPP: 10.0000 mg | Freq: Every day | RECTAL | Status: DC | PRN

## 2011-07-04 MED ORDER — MIDAZOLAM HCL 5 MG/5ML IJ SOLN
INTRAMUSCULAR | Status: DC | PRN
  Administered 2011-07-04: 2 mg via INTRAVENOUS

## 2011-07-04 MED ORDER — DEXAMETHASONE SODIUM PHOSPHATE 10 MG/ML IJ SOLN
INTRAMUSCULAR | Status: AC
  Administered 2011-07-04: 10 mg via INTRAVENOUS
  Filled 2011-07-04: qty 1

## 2011-07-04 MED ORDER — OXYCODONE-ACETAMINOPHEN 5-325 MG PO TABS: 1.0000 | ORAL_TABLET | ORAL | Status: DC | PRN

## 2011-07-04 MED ORDER — BACITRACIN 50000 UNITS IM SOLR
INTRAMUSCULAR | Status: AC
  Filled 2011-07-04: qty 1

## 2011-07-04 MED ORDER — FENTANYL CITRATE 0.05 MG/ML IJ SOLN
INTRAMUSCULAR | Status: DC | PRN
  Administered 2011-07-04: 50 ug via INTRAVENOUS
  Administered 2011-07-04: 100 ug via INTRAVENOUS
  Administered 2011-07-04: 50 ug via INTRAVENOUS

## 2011-07-04 MED ORDER — ROCURONIUM BROMIDE 100 MG/10ML IV SOLN
INTRAVENOUS | Status: DC | PRN
  Administered 2011-07-04: 50 mg via INTRAVENOUS
  Administered 2011-07-04: 10 mg via INTRAVENOUS

## 2011-07-04 MED ORDER — CEFAZOLIN SODIUM 1-5 GM-% IV SOLN
1.0000 g | Freq: Three times a day (TID) | INTRAVENOUS | Status: AC
  Administered 2011-07-04 – 2011-07-05 (×2): 1 g via INTRAVENOUS
  Filled 2011-07-04 (×2): qty 50

## 2011-07-04 MED ORDER — LACTATED RINGERS IV SOLN
INTRAVENOUS | Status: DC | PRN
  Administered 2011-07-04 (×2): via INTRAVENOUS

## 2011-07-04 MED ORDER — THROMBIN 20000 UNITS EX KIT
PACK | CUTANEOUS | Status: DC | PRN
  Administered 2011-07-04: 20000 [IU] via TOPICAL

## 2011-07-04 MED ORDER — SODIUM CHLORIDE 0.9 % IR SOLN
Status: DC | PRN
  Administered 2011-07-04: 12:00:00

## 2011-07-04 MED ORDER — 0.9 % SODIUM CHLORIDE (POUR BTL) OPTIME
TOPICAL | Status: DC | PRN
  Administered 2011-07-04: 1000 mL

## 2011-07-04 MED ORDER — MAGNESIUM CITRATE PO SOLN
1.0000 | Freq: Once | ORAL | Status: AC | PRN
  Filled 2011-07-04: qty 296

## 2011-07-04 MED ORDER — PHENOL 1.4 % MT LIQD: 1.0000 | OROMUCOSAL | Status: DC | PRN

## 2011-07-04 MED ORDER — HEMOSTATIC AGENTS (NO CHARGE) OPTIME
TOPICAL | Status: DC | PRN
  Administered 2011-07-04: 1 via TOPICAL

## 2011-07-04 MED ORDER — SENNA 8.6 MG PO TABS
1.0000 | ORAL_TABLET | Freq: Two times a day (BID) | ORAL | Status: DC
  Administered 2011-07-04 – 2011-07-06 (×4): 8.6 mg via ORAL
  Filled 2011-07-04 (×5): qty 1

## 2011-07-04 MED ORDER — SODIUM CHLORIDE 0.9 % IV SOLN
INTRAVENOUS | Status: AC
  Filled 2011-07-04: qty 500

## 2011-07-04 MED ORDER — SODIUM CHLORIDE 0.9 % IJ SOLN
3.0000 mL | Freq: Two times a day (BID) | INTRAMUSCULAR | Status: DC
  Administered 2011-07-05 – 2011-07-06 (×2): 3 mL via INTRAVENOUS

## 2011-07-04 MED ORDER — HYDROMORPHONE HCL PF 1 MG/ML IJ SOLN
0.2500 mg | INTRAMUSCULAR | Status: DC | PRN
  Administered 2011-07-04 (×2): 0.25 mg via INTRAVENOUS
  Administered 2011-07-04 (×3): 0.5 mg via INTRAVENOUS

## 2011-07-04 MED ORDER — VENLAFAXINE HCL ER 75 MG PO CP24
75.0000 mg | ORAL_CAPSULE | Freq: Every day | ORAL | Status: DC
  Administered 2011-07-05 – 2011-07-06 (×2): 75 mg via ORAL
  Filled 2011-07-04 (×3): qty 1

## 2011-07-04 MED ORDER — DIAZEPAM 5 MG PO TABS: 5.0000 mg | ORAL_TABLET | Freq: Four times a day (QID) | ORAL | Status: DC | PRN

## 2011-07-04 MED ORDER — HYDROMORPHONE HCL PF 1 MG/ML IJ SOLN
INTRAMUSCULAR | Status: AC
  Administered 2011-07-04: 17:00:00
  Filled 2011-07-04: qty 1

## 2011-07-04 MED ORDER — IRBESARTAN 75 MG PO TABS
75.0000 mg | ORAL_TABLET | Freq: Every day | ORAL | Status: DC
  Administered 2011-07-05 – 2011-07-06 (×2): 75 mg via ORAL
  Filled 2011-07-04 (×3): qty 1

## 2011-07-04 MED ORDER — MEPERIDINE HCL 25 MG/ML IJ SOLN: 6.2500 mg | INTRAMUSCULAR | Status: DC | PRN

## 2011-07-04 MED ORDER — MENTHOL 3 MG MT LOZG: 1.0000 | LOZENGE | OROMUCOSAL | Status: DC | PRN

## 2011-07-04 MED ORDER — ACETAMINOPHEN 650 MG RE SUPP: 650.0000 mg | RECTAL | Status: DC | PRN

## 2011-07-04 MED ORDER — ONDANSETRON HCL 4 MG/2ML IJ SOLN: 4.0000 mg | INTRAMUSCULAR | Status: DC | PRN

## 2011-07-04 MED ORDER — POLYETHYLENE GLYCOL 3350 17 G PO PACK
17.0000 g | PACK | Freq: Every day | ORAL | Status: DC | PRN
  Filled 2011-07-04: qty 1

## 2011-07-04 MED ORDER — ONDANSETRON HCL 4 MG/2ML IJ SOLN
INTRAMUSCULAR | Status: DC | PRN
  Administered 2011-07-04: 4 mg via INTRAVENOUS

## 2011-07-04 MED ORDER — HYDROMORPHONE HCL PF 1 MG/ML IJ SOLN
0.5000 mg | INTRAMUSCULAR | Status: DC | PRN
  Administered 2011-07-04 (×2): 1 mg via INTRAVENOUS
  Filled 2011-07-04: qty 1

## 2011-07-04 MED ORDER — SODIUM CHLORIDE 0.9 % IV SOLN
250.0000 mL | INTRAVENOUS | Status: DC
  Administered 2011-07-04: 250 mL via INTRAVENOUS

## 2011-07-04 MED ORDER — HYDROCODONE-ACETAMINOPHEN 5-325 MG PO TABS
1.0000 | ORAL_TABLET | ORAL | Status: DC | PRN
  Administered 2011-07-04 – 2011-07-05 (×5): 2 via ORAL
  Administered 2011-07-06: 1 via ORAL
  Administered 2011-07-06: 2 via ORAL
  Filled 2011-07-04 (×7): qty 2

## 2011-07-04 MED ORDER — LEVOTHYROXINE SODIUM 125 MCG PO TABS
125.0000 ug | ORAL_TABLET | Freq: Every day | ORAL | Status: DC
  Administered 2011-07-05 – 2011-07-06 (×2): 125 ug via ORAL
  Filled 2011-07-04 (×4): qty 1

## 2011-07-04 MED ORDER — NITROGLYCERIN 0.4 MG SL SUBL: 0.4000 mg | SUBLINGUAL_TABLET | SUBLINGUAL | Status: DC | PRN

## 2011-07-04 MED ORDER — ALUM & MAG HYDROXIDE-SIMETH 200-200-20 MG/5ML PO SUSP: 30.0000 mL | Freq: Four times a day (QID) | ORAL | Status: DC | PRN

## 2011-07-04 MED ORDER — MEPERIDINE HCL 25 MG/ML IJ SOLN
6.2500 mg | INTRAMUSCULAR | Status: DC | PRN
Start: 2011-07-04 — End: 2011-07-04

## 2011-07-04 SURGICAL SUPPLY — 68 items
APL SKNCLS STERI-STRIP NONHPOA (GAUZE/BANDAGES/DRESSINGS) ×1
BAG DECANTER FOR FLEXI CONT (MISCELLANEOUS) ×2 IMPLANT
BENZOIN TINCTURE PRP APPL 2/3 (GAUZE/BANDAGES/DRESSINGS) ×2 IMPLANT
BLADE SURG ROTATE 9660 (MISCELLANEOUS) IMPLANT
BRUSH SCRUB EZ PLAIN DRY (MISCELLANEOUS) ×2 IMPLANT
BUR MATCHSTICK NEURO 3.0 LAGG (BURR) ×2 IMPLANT
CANISTER SUCTION 2500CC (MISCELLANEOUS) ×2 IMPLANT
CLOTH BEACON ORANGE TIMEOUT ST (SAFETY) ×2 IMPLANT
CONT SPEC 4OZ CLIKSEAL STRL BL (MISCELLANEOUS) ×4 IMPLANT
COVER BACK TABLE 24X17X13 BIG (DRAPES) IMPLANT
COVER TABLE BACK 60X90 (DRAPES) ×2 IMPLANT
DECANTER SPIKE VIAL GLASS SM (MISCELLANEOUS) ×2 IMPLANT
DERMABOND ADVANCED (GAUZE/BANDAGES/DRESSINGS) ×1
DERMABOND ADVANCED .7 DNX12 (GAUZE/BANDAGES/DRESSINGS) ×1 IMPLANT
DRAPE C-ARM 42X72 X-RAY (DRAPES) ×6 IMPLANT
DRAPE LAPAROTOMY 100X72X124 (DRAPES) ×2 IMPLANT
DRAPE POUCH INSTRU U-SHP 10X18 (DRAPES) ×2 IMPLANT
DRAPE PROXIMA HALF (DRAPES) IMPLANT
DRAPE SURG 17X23 STRL (DRAPES) ×8 IMPLANT
ELECT REM PT RETURN 9FT ADLT (ELECTROSURGICAL) ×2
ELECTRODE REM PT RTRN 9FT ADLT (ELECTROSURGICAL) ×1 IMPLANT
EVACUATOR 1/8 PVC DRAIN (DRAIN) ×2 IMPLANT
GAUZE SPONGE 4X4 16PLY XRAY LF (GAUZE/BANDAGES/DRESSINGS) IMPLANT
GLOVE BIO SURGEON STRL SZ8.5 (GLOVE) ×2 IMPLANT
GLOVE BIOGEL PI IND STRL 7.0 (GLOVE) ×1 IMPLANT
GLOVE BIOGEL PI IND STRL 8 (GLOVE) ×1 IMPLANT
GLOVE BIOGEL PI INDICATOR 7.0 (GLOVE) ×1
GLOVE BIOGEL PI INDICATOR 8 (GLOVE) ×1
GLOVE ECLIPSE 7.5 STRL STRAW (GLOVE) ×2 IMPLANT
GLOVE ECLIPSE 8.5 STRL (GLOVE) ×4 IMPLANT
GLOVE EXAM NITRILE LRG STRL (GLOVE) IMPLANT
GLOVE EXAM NITRILE MD LF STRL (GLOVE) ×4 IMPLANT
GLOVE EXAM NITRILE XL STR (GLOVE) IMPLANT
GLOVE EXAM NITRILE XS STR PU (GLOVE) IMPLANT
GLOVE SS BIOGEL STRL SZ 6.5 (GLOVE) ×2 IMPLANT
GLOVE SS BIOGEL STRL SZ 8 (GLOVE) ×1 IMPLANT
GLOVE SUPERSENSE BIOGEL SZ 6.5 (GLOVE) ×2
GLOVE SUPERSENSE BIOGEL SZ 8 (GLOVE) ×1
GLOVE SURG SS PI 6.5 STRL IVOR (GLOVE) ×2 IMPLANT
GOWN BRE IMP SLV AUR LG STRL (GOWN DISPOSABLE) ×4 IMPLANT
GOWN BRE IMP SLV AUR XL STRL (GOWN DISPOSABLE) ×6 IMPLANT
GOWN STRL REIN 2XL LVL4 (GOWN DISPOSABLE) IMPLANT
GRAFT BN 10X1XDBM MAGNIFUSE (Bone Implant) ×1 IMPLANT
GRAFT BONE MAGNIFUSE 1X10CM (Bone Implant) ×2 IMPLANT
KIT BASIN OR (CUSTOM PROCEDURE TRAY) ×2 IMPLANT
KIT INFUSE SMALL (Orthopedic Implant) ×2 IMPLANT
KIT ROOM TURNOVER OR (KITS) ×2 IMPLANT
NEEDLE HYPO 22GX1.5 SAFETY (NEEDLE) ×2 IMPLANT
NS IRRIG 1000ML POUR BTL (IV SOLUTION) ×2 IMPLANT
Non Break off Screws Set ×10 IMPLANT
PACK LAMINECTOMY NEURO (CUSTOM PROCEDURE TRAY) ×2 IMPLANT
ROD SOLERA 70MM (Rod) ×4 IMPLANT
ROD SOLERA 70X4.75X (Rod) ×2 IMPLANT
SCREW MAS 5.5X40 (Screw) ×6 IMPLANT
SCREW MAS 5.5X45 (Screw) ×4 IMPLANT
SPONGE GAUZE 4X4 12PLY (GAUZE/BANDAGES/DRESSINGS) ×2 IMPLANT
SPONGE SURGIFOAM ABS GEL 100 (HEMOSTASIS) ×2 IMPLANT
STRIP CLOSURE SKIN 1/2X4 (GAUZE/BANDAGES/DRESSINGS) ×2 IMPLANT
SUT VIC AB 0 CT1 18XCR BRD8 (SUTURE) ×2 IMPLANT
SUT VIC AB 0 CT1 8-18 (SUTURE) ×4
SUT VIC AB 2-0 CT1 18 (SUTURE) ×2 IMPLANT
SUT VIC AB 3-0 SH 8-18 (SUTURE) ×4 IMPLANT
SYR 20ML ECCENTRIC (SYRINGE) ×2 IMPLANT
TAPE CLOTH SURG 4X10 WHT LF (GAUZE/BANDAGES/DRESSINGS) ×2 IMPLANT
TOWEL OR 17X24 6PK STRL BLUE (TOWEL DISPOSABLE) ×2 IMPLANT
TOWEL OR 17X26 10 PK STRL BLUE (TOWEL DISPOSABLE) ×2 IMPLANT
TRAY FOLEY CATH 14FRSI W/METER (CATHETERS) ×2 IMPLANT
WATER STERILE IRR 1000ML POUR (IV SOLUTION) ×2 IMPLANT

## 2011-07-04 NOTE — Preoperative (Signed)
Beta Blockers   Reason not to administer Beta Blockers:Not Applicable 

## 2011-07-04 NOTE — Transfer of Care (Signed)
Immediate Anesthesia Transfer of Care Note  Patient: Sylvia Moore  Procedure(s) Performed: Procedure(s) (LRB): POSTERIOR LUMBAR FUSION 2 LEVEL (N/A)  Patient Location: PACU  Anesthesia Type: General  Level of Consciousness: awake, alert , oriented and sedated  Airway & Oxygen Therapy: Patient Spontanous Breathing and Patient connected to nasal cannula oxygen  Post-op Assessment: Report given to PACU RN, Post -op Vital signs reviewed and stable and Patient moving all extremities  Post vital signs: Reviewed and stable  Complications: No apparent anesthesia complications

## 2011-07-04 NOTE — Op Note (Signed)
Date of procedure: 07/04/2011  Date of dictation: Same  Service: Neurosurgery  Preoperative diagnosis: T12 burst fracture with kyphosis and instability.  Postoperative diagnosis: Same  Procedure Name: T11 T12-L1 posterior lateral arthrodesis utilizing segmental pedicle screw instrumentation local autographing, morselized allograft and bone morphogenic protein  Surgeon:Janila Arrazola A.Kc Summerson, M.D.  Asst. Surgeon: Tressie Stalker  Anesthesia: General  Indication: 61 year old status post fall with resultant T12 burst fracture. Patient presents now for operative stabilization and fusion.  Operative note: After induction of anesthesia, patient positioned prone onto a Wilson frame and appropriate padded. Patient's thoracic and lumbar region were prepped and draped sterilely. 10 blade is used to make a linear skin incision overlying the T11-T12 and L1 levels. Subperiosteal dissection then performed bilaterally exposing the lamina and facet joints and transverse processes of T11-T12 and L1. Deep self retaining retractor was placed intraoperative fluoroscopy is used levels were confirmed. Injury sites for pedicle screw screw station were then localized in both the AP and lateral fluoroscopic planes. A pilot holes were drilled. Pilot holes were then probed using a pedicle awl. Pedicle awl track was then tapped with a 5.0 mm screw tap. Screw tap holes were probed and found to be solidly within bone. 5.5 x 40 mm ciliaris Medtronic screws were placed bilaterally at T11. A 5.5 x 40 mm screws placed in the right T12. Pedicle T12 on the left was fractured and unsuitable for screw placement. 5.5 x 45 mm screws placed bilaterally at L1. Transverse processes of L1-T12 and T11 as well as the lamina facet joints of T11-12 and T12-L1 were then decorticated as high-speed drill. Morselized autograft was harvested. This was later packed for hopeful fusion. Bone morphogenic protein-soaked sponges were then placed overlying the  lamina facet joints of T11 T12-L1 and transverse processes of L1. Magnant views was then packed. Short segment titanium rod was then contoured in place of the screw heads at T11-T12 and L1. Locking caps and placed over the screw heads. Locking catching and engaged with the construct under compression. Final images revealed good position the bone graft and hardware proper upper level with normal lamina spine. Wounds and irrigated one final time. The pneumatic drain was left in the paraspinal space. Wounds and close in layers with Vicryl sutures. Steri-Strips and sterile dressing were applied. There no pertinent patient Irene Limbo well and she returns to the recovery postop.

## 2011-07-04 NOTE — Anesthesia Preprocedure Evaluation (Addendum)
Anesthesia Evaluation  Patient identified by MRN, date of birth, ID band Patient awake    Reviewed: Allergy & Precautions, H&P , NPO status , Patient's Chart, lab work & pertinent test results  Airway Mallampati: I TM Distance: >3 FB Neck ROM: Full    Dental   Pulmonary          Cardiovascular hypertension, Pt. on medications +CHF   Pt treated with chemo Rx in 70's for hodgkins lymphoma with resultant cardiomyopathy. Currently good effort tolerance with per patient Echo last summer EF 40%   Neuro/Psych Depression    GI/Hepatic   Endo/Other  Hypothyroidism   Renal/GU      Musculoskeletal   Abdominal   Peds  Hematology   Anesthesia Other Findings   Reproductive/Obstetrics                         Anesthesia Physical Anesthesia Plan  ASA: III  Anesthesia Plan: General   Post-op Pain Management:    Induction: Intravenous  Airway Management Planned: Oral ETT  Additional Equipment:   Intra-op Plan:   Post-operative Plan: Extubation in OR  Informed Consent: I have reviewed the patients History and Physical, chart, labs and discussed the procedure including the risks, benefits and alternatives for the proposed anesthesia with the patient or authorized representative who has indicated his/her understanding and acceptance.     Plan Discussed with: CRNA and Surgeon  Anesthesia Plan Comments:         Anesthesia Quick Evaluation

## 2011-07-04 NOTE — Anesthesia Postprocedure Evaluation (Signed)
Anesthesia Post Note  Patient: Sylvia Moore  Procedure(s) Performed: Procedure(s) (LRB): POSTERIOR LUMBAR FUSION 2 LEVEL (N/A)  Anesthesia type: general  Patient location: PACU  Post pain: Pain level controlled  Post assessment: Patient's Cardiovascular Status Stable  Last Vitals:  Filed Vitals:   07/04/11 1315  BP:   Pulse:   Temp: 36.4 C  Resp:     Post vital signs: Reviewed and stable  Level of consciousness: sedated  Complications: No apparent anesthesia complications

## 2011-07-04 NOTE — H&P (Signed)
Sylvia Moore is an 61 y.o. female.   Chief Complaint: Thoracic 12 fracture HPI: 61 year old status post fall approximately 3 months ago with resultant thoracic lumbar  pain. Workup has demonstrated a T12 burst fracture with some retropulsion. The patient has not responded to conservative management. She shows evidence of worsening kyphosis of her thoracic lumbar junction. She presents now for thoracic lumbar fusion in hopes of improving her symptoms. She has no motor weakness. She has no sensory loss. She is having no bowel or bladder dysfunction.    Past Medical History  Diagnosis Date  . Cardiomyopathy   . CHF (congestive heart failure)   . Depression   . Hypothyroidism   . Hodgkin disease     had rad' and chemo  in 35  . Hypertension     jay varanasi    . Gout     Past Surgical History  Procedure Date  . Dilation and curettage of uterus   . Tonsillectomy   . Forestine Na     has earing aids    No family history on file. Social History:  reports that she has never smoked. She does not have any smokeless tobacco history on file. She reports that she drinks alcohol. She reports that she does not use illicit drugs.  Allergies:  Allergies  Allergen Reactions  . Codeine Nausea And Vomiting    Medications Prior to Admission  Medication Dose Route Frequency Provider Last Rate Last Dose  . ceFAZolin (ANCEF) IVPB 1 g/50 mL premix  1 g Intravenous 60 min Pre-Op Temple Pacini, MD      . dexamethasone (DECADRON) 10 MG/ML injection           . HYDROmorphone (DILAUDID) injection 0.25-0.5 mg  0.25-0.5 mg Intravenous Q5 min PRN Aubery Lapping, MD      . meperidine (DEMEROL) injection 6.25-12.5 mg  6.25-12.5 mg Intravenous Q5 min PRN Aubery Lapping, MD      . morphine 2 MG/ML injection 0.05 mg/kg  0.05 mg/kg Intravenous Q10 min PRN Aubery Lapping, MD      . ondansetron [facility]) injection 4 mg  4 mg Intravenous Once PRN Aubery Lapping, MD       Medications Prior to Admission    Medication Sig Dispense Refill  . allopurinol (ZYLOPRIM) 100 MG tablet Take 100 mg by mouth 2 (two) times daily.      . carvedilol (COREG) 12.5 MG tablet Take 12.5 mg by mouth 2 (two) times daily with a meal.      . levothyroxine (SYNTHROID, LEVOTHROID) 125 MCG tablet Take 125 mcg by mouth daily.      . nitroGLYCERIN (NITROSTAT) 0.4 MG SL tablet Place 0.4 mg under the tongue every 5 (five) minutes as needed. For chest pains      . valsartan (DIOVAN) 80 MG tablet Take 80 mg by mouth daily.      [name] venlafaxine (EFFEXOR-XR) 75 MG 24 hr capsule Take 75 mg by mouth daily.        No results found for this or any previous visit (from the past 48 hour(s)). No results found.  Review of Systems  Constitutional: Negative.   HENT: Negative.   Eyes: Negative.   Respiratory: Negative.   Cardiovascular: Negative.   Gastrointestinal: Negative.   Genitourinary: Negative.   Musculoskeletal: Negative.   Skin: Negative.   Neurological: Negative.   Endo/Heme/Allergies: Negative.   Psychiatric/Behavioral: Negative.     Blood pressure 92/60, pulse 85, temperature 97.7 F (36.5  C), temperature source Oral, resp. rate 18, SpO2 95.00%. Physical Exam  Constitutional: She is oriented to person, place, and time. She appears well-developed and well-nourished. No distress.  HENT:  Head: Normocephalic and atraumatic.  Right Ear: External ear normal.  Left Ear: External ear normal.  Nose: Nose normal.  Mouth/Throat: Oropharynx is clear and moist.  Eyes: Conjunctivae and EOM are normal. Pupils are equal, round, and reactive to light.  Neck: Normal range of motion. Neck supple. No tracheal deviation present. No thyromegaly present.  Cardiovascular: Normal rate, regular rhythm, normal heart sounds and intact distal pulses.   Respiratory: Effort normal and breath sounds normal. No respiratory distress. She has no wheezes.  GI: Soft. Bowel sounds are normal. She exhibits no distension. There is no tenderness.   Musculoskeletal: Normal range of motion. She exhibits tenderness. She exhibits no edema.  Neurological: She is alert and oriented to person, place, and time. She has normal reflexes. No cranial nerve deficit. Coordination normal.  Skin: Skin is warm and dry. No rash noted. She is not diaphoretic. No erythema. No pallor.  Psychiatric: She has a normal mood and affect. Her behavior is normal. Judgment and thought content normal.     Assessment/Plan T12 fracture with instability and progressive kyphosis. Plan T11-L1 posterior lateral arthrodesis utilizing pedicle screw instrumentation. Risks and benefits have been explained. Patient wishes to proceed.  Mareesa Gathright A 07/04/2011, 10:29 AM

## 2011-07-04 NOTE — Brief Op Note (Signed)
07/04/2011  1:35 PM  PATIENT:  Sylvia Moore  61 y.o. female  PRE-OPERATIVE DIAGNOSIS:  Thoracic Twelve Fracture  POST-OPERATIVE DIAGNOSIS:  Thoracic Twelve Fracture  PROCEDURE:  Procedure(s) (LRB): POSTERIOR LUMBAR FUSION 2 LEVEL (N/A)  SURGEON:  Surgeon(s) and Role:    * Temple Pacini, MD - Primary    * Cristi Loron, MD - Assisting  PHYSICIAN ASSISTANT:   ASSISTANTS: Tressie Stalker   ANESTHESIA:   general  EBL:  Total I/O In: 1700 [I.V.:1700] Out: 150 [Urine:150]  BLOOD ADMINISTERED:none  DRAINS: (Medium) Hemovact drain(s) in the Paraspinal space with  Suction Open   LOCAL MEDICATIONS USED:  MARCAINE     SPECIMEN:  No Specimen  DISPOSITION OF SPECIMEN:  N/A  COUNTS:  YES  TOURNIQUET:  * No tourniquets in log *  DICTATION: .Dragon Dictation  PLAN OF CARE: Admit to inpatient   PATIENT DISPOSITION:  PACU - hemodynamically stable.   Delay start of Pharmacological VTE agent (>24hrs) due to surgical blood loss or risk of bleeding: yes

## 2011-07-04 NOTE — Progress Notes (Signed)
Pt had  3 loose stools today

## 2011-07-05 NOTE — Progress Notes (Signed)
OT Discharge Note  Patient is being discharged from OT services secondary to:    Goals met and no further therapy needs identified.  Please see latest Therapy Progress Note for current level of functioning and progress toward goals.  Progress and discharge plan and discussed with patient/caregiver and they    Agree  Pt able to cross BIL LE for LB dressing seated in chair, verbalizes and demo brace don/doff, and verbalized 3 out 3 back precautions. Pt currently with No acute OT needs   Lucile Shutters   OTR/L Pager: [phone] Office: [phone] .

## 2011-07-05 NOTE — Progress Notes (Signed)
Postop day one. Patient feeling much better. No significant back pain. No lower extremity pain. Strength sensation are intact on exam. Wound healing well. Drain output low.  Patient doing well following thoracic/lumbar fusion. Plan to mobilize with therapy. Possible discharge tomorrow.

## 2011-07-05 NOTE — Evaluation (Signed)
Physical Therapy Evaluation Patient Details Name: Sylvia Moore MRN: [id number] DOB: 1950-12-14 Today's Date: 07/05/2011  Problem List:  Patient Active Problem List  Diagnoses  . Thoracic spine fracture    Past Medical History:  Past Medical History  Diagnosis Date  . Cardiomyopathy   . CHF (congestive heart failure)   . Depression   . Hypothyroidism   . Hodgkin disease     had rad' and chemo  in 34  . Hypertension     jay varanasi    . Gout    Past Surgical History:  Past Surgical History  Procedure Date  . Dilation and curettage of uterus   . Tonsillectomy   . Hoh     has earing aids    PT Assessment/Plan/Recommendation PT Assessment Clinical Impression Statement: Pt is 61 y/o female admitted for thoracic fusion due to burst fx from fall 3 months ago.  Pt moving independently in room with supervision for safety initially.  Pt has no acute PT needs at this time.  Therefore PT will sign off.  PT Recommendation/Assessment: Patent does not need any further PT services No Skilled PT: All education completed;Patient will have necessary level of assist by caregiver at discharge;Patient is modified independent with all activity/mobility PT Recommendation Follow Up Recommendations: No PT follow up Equipment Recommended: None recommended by PT PT Goals     PT Evaluation Precautions/Restrictions  Precautions Precautions: Back Precaution Booklet Issued: Yes (comment) Precaution Comments: Reviewed 3/3 Required Braces or Orthoses: Yes Spinal Brace: Thoracolumbosacral orthotic;Applied in sitting position Restrictions Weight Bearing Restrictions: No Prior Functioning  Home Living Lives With: Daughter;Other (Comment) Event organiser) Receives Help From: Family Type of Home: House Home Layout: Two level;Full bath on main level;Able to live on main level with bedroom/bathroom Home Access: Stairs to enter Entrance Stairs-Rails: None Entrance Stairs-Number of Steps: 2 Bathroom  Shower/Tub: Engineer, manufacturing systems: Handicapped height Bathroom Accessibility: Yes How Accessible: Accessible via walker Home Adaptive Equipment: None Prior Function Level of Independence: Independent with basic ADLs;Independent with gait;Independent with transfers Able to Take Stairs?: Yes Driving: Yes Vocation: Other (comment) (Out of work since fall due to back pain) Cognition Cognition Arousal/Alertness: Awake/alert Overall Cognitive Status: Appears within functional limits for tasks assessed Orientation Level: Oriented X4 Sensation/Coordination Sensation Light Touch: Appears Intact Coordination Gross Motor Movements are Fluid and Coordinated: Yes Extremity Assessment RLE Assessment RLE Assessment: Within Functional Limits LLE Assessment LLE Assessment: Within Functional Limits Mobility (including Balance) Bed Mobility Bed Mobility: Yes Rolling Right: 6: Modified independent (Device/Increase time);With rail Right Sidelying to Sit: 6: Modified independent (Device/Increase time);HOB flat;With rails Transfers Transfers: Yes Sit to Stand: 6: Modified independent (Device/Increase time);From bed;From chair/3-in-1 Stand to Sit: 6: Modified independent (Device/Increase time);To chair/3-in-1;To toilet;With upper extremity assist Ambulation/Gait Ambulation/Gait: Yes Ambulation/Gait Assistance: 5: Supervision Ambulation/Gait Assistance Details (indicate cue type and reason): Supervision for safety due to mild c/o dizziness but subsided with time Ambulation Distance (Feet): 200 Feet Assistive device: None Gait Pattern: Decreased trunk rotation;Step-through pattern (wide BOS) Stairs: Yes Stairs Assistance: 5: Supervision Stairs Assistance Details (indicate cue type and reason): Supervision for safety Stair Management Technique: No rails Number of Stairs: 2   Posture/Postural Control Posture/Postural Control: No significant limitations Balance Balance Assessed:  Yes Static Standing Balance Static Standing - Balance Support: Left upper extremity supported Static Standing - Level of Assistance: 5: Stand by assistance Static Standing - Comment/# of Minutes: ~5 minutes to perform ADL task at sink Exercise    End of Session PT - End of  Session Equipment Utilized During Treatment: Gait belt;Back brace Activity Tolerance: Patient tolerated treatment well Patient left: in chair;with call bell in reach Nurse Communication: Mobility status for transfers;Mobility status for ambulation General Behavior During Session: [facility] for tasks performed Cognition: [facility] for tasks performed  Sylvia Moore 07/05/2011, 10:29 AM Pager:  [phone]

## 2011-07-05 NOTE — Plan of Care (Signed)
Problem: Consults Goal: Diagnosis - Spinal Surgery Outcome: Completed/Met Date Met:  07/05/11 Thoraco/Lumbar Spine Fusion

## 2011-07-06 MED ORDER — HYDROCODONE-ACETAMINOPHEN 5-325 MG PO TABS: 1.0000 | ORAL_TABLET | ORAL | Status: AC | PRN

## 2011-07-06 MED ORDER — DIAZEPAM 5 MG PO TABS: 5.0000 mg | ORAL_TABLET | Freq: Four times a day (QID) | ORAL | Status: AC | PRN

## 2011-07-06 NOTE — Discharge Summary (Signed)
Physician Discharge Summary  Patient ID: Sylvia Moore MRN: [id number] DOB/AGE: [birth year] 61 y.o.  Admit date: 07/04/2011 Discharge date: 07/06/2011  Admission Diagnoses:  Discharge Diagnoses:  Principal Problem:  *Thoracic spine fracture   Discharged Condition: good  Hospital Course: Admitted to the hospital where she underwent uncomplicated T11-L1 posterior lateral fusion with instrumentation. Postoperative she has done well. She has no new back or lower trimming pain. Strength station are intact. Wound healing well. Chest and abdomen benign. Plan for discharge home. Consults:   Significant Diagnostic Studies:   Treatments:   Discharge Exam: Blood pressure 91/54, pulse 72, temperature 97.5 F (36.4 C), temperature source Oral, resp. rate 18, height 5\' 7"  (1.702 m), weight 98.884 kg (218 lb), SpO2 92.00%. Awake alert oriented and appropriate. Cranial nerve function is intact. Motor and sensory function extremities is normal. Wound healing well. Chest and abdomen benign.  Disposition: 01-Home or Self Care   Medication List  As of 07/06/2011 11:09 AM   TAKE these medications         allopurinol 100 MG tablet   Commonly known as: ZYLOPRIM   Take 100 mg by mouth 2 (two) times daily.      carvedilol 12.5 MG tablet   Commonly known as: COREG   Take 12.5 mg by mouth 2 (two) times daily with a meal.      diazepam 5 MG tablet   Commonly known as: VALIUM   Take 1-2 tablets (5-10 mg total) by mouth every 6 (six) hours as needed.      HYDROcodone-acetaminophen 5-325 MG per tablet   Commonly known as: NORCO   Take 1-2 tablets by mouth every 4 (four) hours as needed.      levothyroxine 125 MCG tablet   Commonly known as: SYNTHROID, LEVOTHROID   Take 125 mcg by mouth daily.      nitroGLYCERIN 0.4 MG SL tablet   Commonly known as: NITROSTAT   Place 0.4 mg under the tongue every 5 (five) minutes as needed. For chest pains      valsartan 80 MG tablet   Commonly known as: DIOVAN     Take 80 mg by mouth daily.      venlafaxine 75 MG 24 hr capsule   Commonly known as: EFFEXOR-XR   Take 75 mg by mouth daily.           Follow-up Information    Follow up with Yehudah Standing A, MD. Call in 1 week.   Contact information:   [address] [place] [address] [phone]          Signed: Temple Pacini 07/06/2011, 11:09 AM

## 2011-07-06 NOTE — Progress Notes (Signed)
Pt given D/C instructions with Rx's. Pt's dressing changed prior to D/C. Pt D/C'd home with family per MD order. Sylvia Moore

## 2011-07-07 MED FILL — Sodium Chloride Irrigation Soln 0.9%: Qty: 3000 | Status: AC

## 2011-07-07 MED FILL — Heparin Sodium (Porcine) Inj 1000 Unit/ML: INTRAMUSCULAR | Qty: 30 | Status: AC

## 2011-07-07 MED FILL — Sodium Chloride IV Soln 0.9%: INTRAVENOUS | Qty: 1000 | Status: AC

## 2011-12-07 ENCOUNTER — Other Ambulatory Visit: Payer: Self-pay | Admitting: Dermatology

## 2012-05-31 ENCOUNTER — Other Ambulatory Visit: Payer: Self-pay | Admitting: Obstetrics and Gynecology

## 2012-05-31 DIAGNOSIS — Z1231 Encounter for screening mammogram for malignant neoplasm of breast: Secondary | ICD-10-CM

## 2012-06-01 ENCOUNTER — Telehealth: Payer: Self-pay | Admitting: Obstetrics and Gynecology

## 2012-06-01 NOTE — Telephone Encounter (Signed)
VM from pt requesting RX to [facility]. Pt [id number]. Has appt 06/2012

## 2012-06-01 NOTE — Telephone Encounter (Signed)
Lm on vm tcb rgd msg 

## 2012-06-21 ENCOUNTER — Ambulatory Visit
Admission: RE | Admit: 2012-06-21 | Discharge: 2012-06-21 | Disposition: A | Discharge status: Home / Self Care | Payer: BC Managed Care – PPO | Source: Ambulatory Visit | Attending: Obstetrics and Gynecology | Admitting: Obstetrics and Gynecology

## 2012-06-21 DIAGNOSIS — Z1231 Encounter for screening mammogram for malignant neoplasm of breast: Secondary | ICD-10-CM

## 2012-07-03 ENCOUNTER — Encounter: Payer: Self-pay | Admitting: Obstetrics and Gynecology

## 2012-07-03 ENCOUNTER — Ambulatory Visit: Payer: BC Managed Care – PPO | Admitting: Obstetrics and Gynecology

## 2012-07-03 VITALS — BP 108/64 | HR 76 | Ht 66.0 in | Wt 217.0 lb

## 2012-07-03 DIAGNOSIS — Z124 Encounter for screening for malignant neoplasm of cervix: Secondary | ICD-10-CM

## 2012-07-03 DIAGNOSIS — N9089 Other specified noninflammatory disorders of vulva and perineum: Secondary | ICD-10-CM

## 2012-07-03 MED ORDER — VENLAFAXINE HCL ER 75 MG PO CP24: 75.0000 mg | ORAL_CAPSULE | Freq: Every day | ORAL | Status: DC

## 2012-07-03 NOTE — Progress Notes (Signed)
Last Pap: 01/10/11 WNL: Yes Regular Periods:no Contraception: post menopausal   Monthly Breast exam:yes Tetanus<18yrs:yes Nl.Bladder Function:yes Daily BMs:yes Healthy Diet:yes Calcium:no Mammogram:yes Date of Mammogram: 06/2012 Exercise:yes Have often Exercise: once per week  Seatbelt: yes Abuse at home: no Stressful work:no Sigmoid-colonoscopy: 3-4 years ago  Bone Density: Yes 3-4 yrs ago PCP: Dr. Nehemiah Settle  Change in PMH: no changes Change in FMH:no changes BP 108/64  Pulse 76  Ht 5\' 6"  (1.676 m)  Wt 217 lb (98.431 kg)  BMI 35.04 kg/m2 Physical Examination: Neck - supple, no significant adenopathy Chest - clear to auscultation, no wheezes, rales or rhonchi, symmetric air entry Heart - normal rate, regular rhythm, normal S1, S2, no murmurs, rubs, clicks or gallops Abdomen - soft, nontender, nondistended, no masses or organomegaly Breasts - breasts appear normal, no suspicious masses, no skin or nipple changes or axillary nodes Pelvic - normal external genitalia, vulva, vagina, cervix, uterus and adnexa, atrophic.  On right labium majorum there is a flat lesion that appears like an excoriation.  NT Rectal - normal rectal, no masses Musculoskeletal - no joint tenderness, deformity or swelling Extremities - peripheral pulses normal, no pedal edema, no clubbing or cyanosis Skin - normal coloration and turgor, no rashes, no suspicious skin lesions noted Normal AEX Pt due for mammogram no colonoscopy due no Pap done yes Vulvar lesion noted.  Pt offered a bx.  R&B reviewed. Consent signed.  Betadine used for prep.  3cc one % lidocaine used for anesthesia.  Punch bx done of area and sent to pathology.  Hemostasis obtained with two interrupted suture of 3-0 vicryl RT one year Pt desires to continue effexor for menopausal sxs Diet and exercise discussed

## 2012-07-05 LAB — PAP IG W/ RFLX HPV ASCU

## 2012-07-05 LAB — PATHOLOGY

## 2012-07-12 ENCOUNTER — Telehealth: Payer: Self-pay

## 2012-07-12 ENCOUNTER — Telehealth: Payer: Self-pay | Admitting: Obstetrics and Gynecology

## 2012-07-12 NOTE — Telephone Encounter (Signed)
Message copied by Rolla Plate on Thu Jul 12, 2012  9:10 AM ------      Message from: Jaymes Graff      Created: Wed Jul 11, 2012 11:00 PM       Please have the pt come to see me ASAP to discuss the results. ------

## 2012-07-12 NOTE — Telephone Encounter (Signed)
Lm on vm tcb rgd labs 

## 2012-07-12 NOTE — Telephone Encounter (Signed)
Spoke with pt rgd labs informed per ND wants to review labs at an appt pt has appt 07/18/12 at 4:15 with ND pt voice understanding

## 2012-07-13 NOTE — Telephone Encounter (Signed)
TC to ann, contact of pt. States pt is agreeable to wait to speak with DR ND at appt 07/18/12.

## 2012-07-23 ENCOUNTER — Telehealth: Payer: Self-pay | Admitting: Obstetrics and Gynecology

## 2012-07-23 ENCOUNTER — Other Ambulatory Visit: Payer: Self-pay | Admitting: Obstetrics and Gynecology

## 2012-07-23 NOTE — Telephone Encounter (Signed)
Excision of Right Labia Majora, Colpo of the Vulva and vagina scheduled for 08/14/12 @ 9:00 with ND. [employer] effective 05/02/12. Plan pays 80/20 after a $700 deductible. Pre-op due $64.10. -Sylvia Moore

## 2012-08-06 ENCOUNTER — Other Ambulatory Visit: Payer: Self-pay | Admitting: Internal Medicine

## 2012-08-06 ENCOUNTER — Ambulatory Visit
Admission: RE | Admit: 2012-08-06 | Discharge: 2012-08-06 | Disposition: A | Discharge status: Home / Self Care | Payer: BC Managed Care – PPO | Source: Ambulatory Visit | Attending: Internal Medicine | Admitting: Internal Medicine

## 2012-08-06 DIAGNOSIS — R05 Cough: Secondary | ICD-10-CM

## 2012-08-06 MED ORDER — IOHEXOL 300 MG/ML  SOLN
75.0000 mL | Freq: Once | INTRAMUSCULAR | Status: AC | PRN
  Administered 2012-08-06: 75 mL via INTRAVENOUS

## 2012-08-07 ENCOUNTER — Encounter (HOSPITAL_COMMUNITY): Payer: Self-pay | Admitting: Pharmacist

## 2012-08-09 ENCOUNTER — Encounter (HOSPITAL_COMMUNITY): Payer: Self-pay

## 2012-08-09 ENCOUNTER — Encounter (HOSPITAL_COMMUNITY)
Admission: RE | Admit: 2012-08-09 | Discharge: 2012-08-09 | Disposition: A | Discharge status: Home / Self Care | Payer: BC Managed Care – PPO | Source: Ambulatory Visit | Attending: Obstetrics and Gynecology | Admitting: Obstetrics and Gynecology

## 2012-08-09 HISTORY — DX: Unspecified hearing loss, unspecified ear: H91.90

## 2012-08-09 HISTORY — DX: Pneumonia, unspecified organism: J18.9

## 2012-08-09 HISTORY — DX: Personal history of other medical treatment: Z92.89

## 2012-08-09 LAB — BASIC METABOLIC PANEL
CO2: 27 mEq/L (ref 19–32)
Calcium: 9.9 mg/dL (ref 8.4–10.5)
GFR calc Af Amer: >90 mL/min (ref >90)
GFR calc non Af Amer: 89 mL/min — ABNORMAL LOW (ref >90)
Sodium: 135 mEq/L (ref 135–145)

## 2012-08-09 NOTE — Pre-Procedure Instructions (Signed)
Reviewed patient's medical history/medications with Dr Malen Gauze.  Patient needs to have cardiac clearance from Dr. Everette Rank - Cardiologist.  Copy of today's EKG given to patient.  Dr Malen Gauze needs cardiac clearance prior to patient's surgery on 08/14/12.  Informed patient and Adrianne at Dr Redmond Baseman office.  Adrianne to follow up on getting cardiac clearance for surgery.

## 2012-08-09 NOTE — Patient Instructions (Addendum)
   Your procedure is scheduled on: Tuesday, April 15  Enter through the [employer] [facility] at: 730 am Pick up the phone at the desk and dial [phone] and inform us of your arrival.  Please call this number if you have any problems the morning of surgery: [phone]  Remember: Do not eat  Or drink after midnight:  Monday Take these medicines the morning of surgery with a SIP OF WATER:  Carvedilol, effexor, synthroid, allopurinol  Do not wear jewelry, make-up, or FINGER nail polish No metal in your hair or on your body. Do not wear lotions, powders, perfumes. You may wear deodorant.  Please use your CHG wash as directed prior to surgery.  Do not shave anywhere for at least 12 hours prior to first CHG shower.  Do not bring valuables to the hospital. Contacts, dentures or bridgework may not be worn into surgery.  Patients discharged on the day of surgery will not be allowed to drive home.  Home with partner Dewayne Hatch.

## 2012-08-09 NOTE — Pre-Procedure Instructions (Signed)
Adrienne from Dr. Redmond Baseman office called to inform us that pt has been diagnosed with pneumonia and started antibiotics on 08/05/12 for a 10 day treatment. Dr. Normand Sloop needs to know if/when surgery on 08/14/12 needs to be cancelled and rescheduled. I spoke with Dr. Arby Barrette and he said that if pt is progressing and improving on antibiotic and since surgery is outpt, then no need to cancel surgery on 08/14/12.

## 2012-08-13 NOTE — H&P (Signed)
62 YO Female presenting for wide local excision and colposcopy.  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 Cardiomyopathy   . CHF (congestive heart failure)   . Depression   . Hypothyroidism   . Hypertension     jay varanasi    . Gout   . Hodgkin disease     had rad' and chemo  in 62  . HOH (hard of hearing)     has hearing aids  . Headache 07/2012    otc med prn = none in the last 8 yrs   . Anemia     history only  . History of blood transfusion 1977    at [facility] - unsure of number of units  . Pneumonia 08/09/2012    on abx x 5 days  Scheduled Meds: see MEDS Continuous Infusions: PRN Meds:.   Family History  Problem Relation Age of Onset  . Hypertension Father    History   Social History  . Marital Status: Single    Spouse Name: N/A    Number of Children: N/A  . Years of Education: N/A   Occupational History  . Not on file.   Social History Main Topics  . Smoking status: Never Smoker   . Smokeless tobacco: Never Used  . Alcohol Use: Yes     Comment: weekends  . Drug Use: Yes    Special: Marijuana     Comment: for 8 yrs - quit 26 yrs ago  . Sexually Active: Not on file   Other Topics Concern  . Not on file   Social History Narrative  . No narrative on file  There were no vitals taken for this visit. Genito-Urinary ROS: no dysuria, trouble voiding, or hematuria AVSS Physical Examination: General appearance - alert, well appearing, and in no distress Chest - clear to auscultation, no wheezes, rales or rhonchi, symmetric air entry Heart - normal rate and regular rhythm Abdomen - soft, nontender, nondistended, no masses or organomegaly Pelvic - normal external genitalia, vulva, vagina, cervix, uterus and adnexa Extremities - peripheral pulses normal, no pedal edema, no clubbing or cyanosis Skin - normal coloration and turgor, no rashes, no suspicious skin lesions noted VIN 3 Plan colpo and wide  local excision R&B reviewed with the pt

## 2012-08-14 ENCOUNTER — Ambulatory Visit (HOSPITAL_COMMUNITY): Payer: BC Managed Care – PPO | Admitting: Anesthesiology

## 2012-08-14 ENCOUNTER — Encounter (HOSPITAL_COMMUNITY): Payer: Self-pay | Admitting: Anesthesiology

## 2012-08-14 ENCOUNTER — Encounter (HOSPITAL_COMMUNITY)
Admission: RE | Disposition: A | Discharge status: Home / Self Care | Payer: Self-pay | Source: Ambulatory Visit | Attending: Obstetrics and Gynecology

## 2012-08-14 ENCOUNTER — Ambulatory Visit (HOSPITAL_COMMUNITY)
Admission: RE | Admit: 2012-08-14 | Discharge: 2012-08-14 | Disposition: A | Discharge status: Home / Self Care | Payer: BC Managed Care – PPO | Source: Ambulatory Visit | Attending: Obstetrics and Gynecology | Admitting: Obstetrics and Gynecology

## 2012-08-14 DIAGNOSIS — D072 Carcinoma in situ of vagina: Secondary | ICD-10-CM

## 2012-08-14 DIAGNOSIS — L851 Acquired keratosis [keratoderma] palmaris et plantaris: Secondary | ICD-10-CM | POA: Insufficient documentation

## 2012-08-14 DIAGNOSIS — D071 Carcinoma in situ of vulva: Secondary | ICD-10-CM | POA: Insufficient documentation

## 2012-08-14 DIAGNOSIS — N893 Dysplasia of vagina, unspecified: Secondary | ICD-10-CM | POA: Insufficient documentation

## 2012-08-14 HISTORY — PX: LESION REMOVAL: SHX5196

## 2012-08-14 HISTORY — PX: RECTAL BIOPSY: SHX2303

## 2012-08-14 HISTORY — PX: COLPOSCOPY: SHX161

## 2012-08-14 SURGERY — COLPOSCOPY
Anesthesia: Spinal | Site: Vulva | Wound class: Clean Contaminated

## 2012-08-14 MED ORDER — LIDOCAINE IN DEXTROSE 5-7.5 % IV SOLN
INTRAVENOUS | Status: DC | PRN
  Administered 2012-08-14 (×2): 10 mL via INTRATHECAL

## 2012-08-14 MED ORDER — METOCLOPRAMIDE HCL 5 MG/ML IJ SOLN: 10.0000 mg | Freq: Once | INTRAMUSCULAR | Status: DC | PRN

## 2012-08-14 MED ORDER — MIDAZOLAM HCL 2 MG/2ML IJ SOLN
INTRAMUSCULAR | Status: AC
  Filled 2012-08-14: qty 2

## 2012-08-14 MED ORDER — KETOROLAC TROMETHAMINE 30 MG/ML IJ SOLN
INTRAMUSCULAR | Status: AC
  Filled 2012-08-14: qty 1

## 2012-08-14 MED ORDER — HYDROCODONE-ACETAMINOPHEN 5-300 MG PO TABS: 1.0000 | ORAL_TABLET | ORAL | Status: DC | PRN

## 2012-08-14 MED ORDER — IBUPROFEN 600 MG PO TABS: 600.0000 mg | ORAL_TABLET | Freq: Four times a day (QID) | ORAL | Status: DC | PRN

## 2012-08-14 MED ORDER — PROPOFOL 10 MG/ML IV EMUL
INTRAVENOUS | Status: AC
  Filled 2012-08-14: qty 20

## 2012-08-14 MED ORDER — BUPIVACAINE IN DEXTROSE 0.75-8.25 % IT SOLN
INTRATHECAL | Status: DC | PRN
  Administered 2012-08-14: 1.4 mL via INTRATHECAL

## 2012-08-14 MED ORDER — MEPERIDINE HCL 25 MG/ML IJ SOLN: 6.2500 mg | INTRAMUSCULAR | Status: DC | PRN

## 2012-08-14 MED ORDER — LIDOCAINE HCL (CARDIAC) 20 MG/ML IV SOLN
INTRAVENOUS | Status: AC
  Filled 2012-08-14: qty 5

## 2012-08-14 MED ORDER — LIDOCAINE HCL 2 % EX GEL: Freq: Two times a day (BID) | CUTANEOUS | Status: DC | PRN

## 2012-08-14 MED ORDER — PROPOFOL 10 MG/ML IV BOLUS
INTRAVENOUS | Status: DC | PRN
  Administered 2012-08-14 (×9): 10 ug via INTRAVENOUS

## 2012-08-14 MED ORDER — SILVER NITRATE-POT NITRATE 75-25 % EX MISC
CUTANEOUS | Status: DC | PRN
  Administered 2012-08-14: 1 via TOPICAL

## 2012-08-14 MED ORDER — ACETIC ACID 4% SOLUTION
Status: DC | PRN
  Administered 2012-08-14: 1 via TOPICAL

## 2012-08-14 MED ORDER — FENTANYL CITRATE 0.05 MG/ML IJ SOLN: 25.0000 ug | INTRAMUSCULAR | Status: DC | PRN

## 2012-08-14 MED ORDER — ONDANSETRON HCL 4 MG/2ML IJ SOLN
INTRAMUSCULAR | Status: AC
  Filled 2012-08-14: qty 2

## 2012-08-14 MED ORDER — LACTATED RINGERS IV SOLN
INTRAVENOUS | Status: DC
  Administered 2012-08-14: 08:00:00 via INTRAVENOUS

## 2012-08-14 MED ORDER — MIDAZOLAM HCL 5 MG/5ML IJ SOLN
INTRAMUSCULAR | Status: DC | PRN
  Administered 2012-08-14: 2 mg via INTRAVENOUS

## 2012-08-14 MED ORDER — LIDOCAINE HCL 1 % IJ SOLN
INTRAMUSCULAR | Status: DC | PRN
  Administered 2012-08-14: 20 mL

## 2012-08-14 SURGICAL SUPPLY — 27 items
APPLICATOR COTTON TIP 6IN STRL (MISCELLANEOUS) ×4 IMPLANT
CLOTH BEACON ORANGE TIMEOUT ST (SAFETY) ×4 IMPLANT
CONTAINER PREFILL 10% NBF 15ML (MISCELLANEOUS) ×4 IMPLANT
COUNTER NEEDLE 1200 MAGNETIC (NEEDLE) ×4 IMPLANT
DRESSING TELFA 8X3 (GAUZE/BANDAGES/DRESSINGS) ×4 IMPLANT
EVACUATOR PREFILTER SMOKE (MISCELLANEOUS) ×4 IMPLANT
GLOVE BIO SURGEON STRL SZ 6.5 (GLOVE) ×4 IMPLANT
GLOVE BIOGEL PI IND STRL 7.0 (GLOVE) ×3 IMPLANT
GLOVE BIOGEL PI INDICATOR 7.0 (GLOVE) ×1
GOWN PREVENTION PLUS LG XLONG (DISPOSABLE) ×12 IMPLANT
GOWN STRL REIN XL XLG (GOWN DISPOSABLE) ×8 IMPLANT
HOSE NS SMOKE EVAC 7/8 X6 (MISCELLANEOUS) ×4 IMPLANT
NS IRRIG 1000ML POUR BTL (IV SOLUTION) IMPLANT
PACK VAGINAL MINOR WOMEN LF (CUSTOM PROCEDURE TRAY) ×4 IMPLANT
PAD OB MATERNITY 4.3X12.25 (PERSONAL CARE ITEMS) ×4 IMPLANT
PENCIL BUTTON HOLSTER BLD 10FT (ELECTRODE) ×4 IMPLANT
REDUCER FITTING SMOKE EVAC (MISCELLANEOUS) ×4 IMPLANT
SCOPETTES 8  STERILE (MISCELLANEOUS) ×2
SCOPETTES 8 STERILE (MISCELLANEOUS) ×6 IMPLANT
SUT MNCRL AB 4-0 PS2 18 (SUTURE) ×4 IMPLANT
SUT VIC AB 0 CT1 27 (SUTURE) ×3
SUT VIC AB 0 CT1 27XBRD ANBCTR (SUTURE) ×3 IMPLANT
SUT VIC AB 3-0 SH 27 (SUTURE) ×4
SUT VIC AB 3-0 SH 27X BRD (SUTURE) ×3 IMPLANT
SYR 20CC LL (SYRINGE) ×4 IMPLANT
TOWEL OR 17X24 6PK STRL BLUE (TOWEL DISPOSABLE) ×8 IMPLANT
WATER STERILE IRR 1000ML POUR (IV SOLUTION) ×4 IMPLANT

## 2012-08-14 NOTE — Op Note (Signed)
08/14/2012  10:41 AM  PATIENT:  Sylvia Moore  62 y.o. female  PRE-OPERATIVE DIAGNOSIS:  Vaginal Dysplasia, VIN III/CIS ;CPT Codes 16109, [id number], [phone]  POST-OPERATIVE DIAGNOSIS:  Vaginal Dysplasia, Vulvar intra-epithelial neoplasia lll  PROCEDURE:  Procedure(s) with comments: COLPOSCOPY (N/A) - Colpo of Vulva and Vagina EXCISION VAGINAL LESION (N/A) - Wide Local Excision of Right Labia Majori BIOPSY RECTAL (N/A) - peri-anal biopsy  SURGEON:  Surgeon(s) and Role:    * Audi Conover A Timiyah Romito, MD - Primary  PHYSICIAN ASSISTANT:  none  ASSISTANTS: none   ANESTHESIA:   local, spinal and IV sedation  EBL:  Total I/O In: 500 [I.V.:500] Out: -   BLOOD ADMINISTERED:none  DRAINS: none   LOCAL MEDICATIONS USED:  LIDOCAINE  and Amount: 20 ml  SPECIMEN:  Source of Specimen:  wide local excision of the vulva, perianal bx  DISPOSITION OF SPECIMEN:  PATHOLOGY  COUNTS:  YES  TOURNIQUET:  * No tourniquets in log *  DICTATION: .Dragon Dictation the patient was taken to the operating room given spinal anesthesia placed in dorsal lithotomy position.  Colposcopy was done.  Bivalve speculum was placed into the vagina.  Acetic acid was placed around the cervix and vagina there were no acetowhite changes the colposcopy of the vagina and the cervix was normal even with the Greenfield filter.  Bivalve speculum was removed from the vagina. He was then done of the bladder were using acetic acid. There were no acetowhite changes seen. She was then prepped and draped using Betadine. An in out catheter was used to drain the bladder. The right labia majora we could see the area that had been biopsied. Using the knife I did a large wedge resection of the area. Sent to pathology. The vessels that were seen to be bleeding were made hemostatic with Bovie cautery. Interrupted sutures were used to reapproximate the tissue of the labia majora. 2 layers used to do this with 0 Vicryl the second layer with 3-0 Vicryl. Skin  was reapproximated using 3-0 Monocryl subcuticular fashion. Hemostasis was noted. She was done to have these perianal small but appeared to be hemorrhoids. Did biopsy one just to rule out any dysplasia. He was removed from the vagina of a sponge lap and needle counts were correct patient back in stable condition  PLAN OF CARE: Discharge to home after PACU  PATIENT DISPOSITION:  PACU - hemodynamically stable.   Delay start of Pharmacological VTE agent (>24hrs) due to surgical blood loss or risk of bleeding: not applicable

## 2012-08-14 NOTE — Anesthesia Preprocedure Evaluation (Addendum)
Anesthesia Evaluation  Patient identified by MRN, date of birth, ID band Patient awake    Reviewed: Allergy & Precautions, H&P , NPO status , Patient's Chart, lab work & pertinent test results  Airway Mallampati: I TM Distance: >3 FB Neck ROM: Full    Dental no notable dental hx. (+) Partial Upper and Caps   Pulmonary pneumonia -,  breath sounds clear to auscultation  Pulmonary exam normal       Cardiovascular hypertension, Pt. on medications +CHF Rhythm:Regular Rate:Normal  Hx/o Cardiomyopathy Last EF 42%   Neuro/Psych  Headaches, PSYCHIATRIC DISORDERS Depression HOH    GI/Hepatic negative GI ROS, Neg liver ROS,   Endo/Other  Hypothyroidism Gout  Renal/GU negative Renal ROS   Vulvar Dysplasia negative genitourinary   Musculoskeletal negative musculoskeletal ROS (+)   Abdominal Normal abdominal exam  (+) + obese,   Peds  Hematology Hx/o Hodgkin's Disease S/P Chemotherapy   Anesthesia Other Findings   Reproductive/Obstetrics negative OB ROS                          Anesthesia Physical Anesthesia Plan  ASA: III  Anesthesia Plan: Spinal   Post-op Pain Management:    Induction:   Airway Management Planned: Natural Airway  Additional Equipment:   Intra-op Plan:   Post-operative Plan:   Informed Consent: I have reviewed the patients History and Physical, chart, labs and discussed the procedure including the risks, benefits and alternatives for the proposed anesthesia with the patient or authorized representative who has indicated his/her understanding and acceptance.   Dental advisory given  Plan Discussed with: CRNA, Anesthesiologist and Surgeon  Anesthesia Plan Comments:         Anesthesia Quick Evaluation

## 2012-08-14 NOTE — Anesthesia Postprocedure Evaluation (Signed)
  Anesthesia Post-op Note  Patient: Sylvia Moore  Procedure(s) Performed: Procedure(s) with comments: COLPOSCOPY (N/A) - Colpo of Vulva and Vagina EXCISION VAGINAL LESION (N/A) - Wide Local Excision of Right Labia Majori BIOPSY RECTAL (N/A) - peri-anal biopsy  Patient Location: PACU  Anesthesia Type:Spinal  Level of Consciousness: awake and alert   Airway and Oxygen Therapy: Patient Spontanous Breathing  Post-op Pain: none  Post-op Assessment: Post-op Vital signs reviewed, Patient's Cardiovascular Status Stable, Respiratory Function Stable, Patent Airway, No signs of Nausea or vomiting, Pain level controlled, No headache and No backache  Post-op Vital Signs: Reviewed and stable  Complications: No apparent anesthesia complications

## 2012-08-14 NOTE — Transfer of Care (Signed)
Immediate Anesthesia Transfer of Care Note  Patient: Sylvia Moore  Procedure(s) Performed: Procedure(s) with comments: COLPOSCOPY (N/A) - Colpo of Vulva and Vagina EXCISION VAGINAL LESION (N/A) - Wide Local Excision of Right Labia Majori BIOPSY RECTAL (N/A) - peri-anal biopsy  Patient Location: PACU  Anesthesia Type:Spinal  Level of Consciousness: awake, alert  and patient cooperative  Airway & Oxygen Therapy: Patient Spontanous Breathing and Patient connected to nasal cannula oxygen  Post-op Assessment: Report given to PACU RN and Post -op Vital signs reviewed and stable  Post vital signs: Reviewed and stable  Complications: No apparent anesthesia complications

## 2012-08-14 NOTE — Anesthesia Procedure Notes (Signed)
Spinal  Patient location during procedure: OR Start time: 08/14/2012 9:42 AM Staffing Anesthesiologist: Jeena Arnett A. Performed by: anesthesiologist  Preanesthetic Checklist Completed: patient identified, site marked, surgical consent, pre-op evaluation, timeout performed, IV checked, risks and benefits discussed and monitors and equipment checked Spinal Block Patient position: sitting Prep: site prepped and draped and DuraPrep Patient monitoring: heart rate, cardiac monitor, continuous pulse ox and blood pressure Approach: midline Location: L3-4 Injection technique: single-shot Needle Needle type: Sprotte  Needle gauge: 24 G Needle length: 9 cm Assessment Sensory level: T8 Additional Notes Patient tolerated procedure well. Adequate sensory level.

## 2012-08-15 ENCOUNTER — Encounter (HOSPITAL_COMMUNITY): Payer: Self-pay | Admitting: Obstetrics and Gynecology

## 2013-05-02 HISTORY — PX: KNEE ARTHROSCOPY: SHX127

## 2013-05-22 ENCOUNTER — Other Ambulatory Visit: Payer: Self-pay

## 2013-05-22 DIAGNOSIS — Z1231 Encounter for screening mammogram for malignant neoplasm of breast: Secondary | ICD-10-CM

## 2013-06-24 ENCOUNTER — Ambulatory Visit
Admission: RE | Admit: 2013-06-24 | Discharge: 2013-06-24 | Disposition: A | Discharge status: Home / Self Care | Payer: BC Managed Care – PPO | Source: Ambulatory Visit

## 2013-06-24 DIAGNOSIS — Z1231 Encounter for screening mammogram for malignant neoplasm of breast: Secondary | ICD-10-CM

## 2013-07-05 ENCOUNTER — Other Ambulatory Visit: Payer: Self-pay | Admitting: Orthopedic Surgery

## 2013-07-05 DIAGNOSIS — M25569 Pain in unspecified knee: Secondary | ICD-10-CM

## 2013-07-10 ENCOUNTER — Emergency Department (HOSPITAL_COMMUNITY)
Admission: EM | Admit: 2013-07-10 | Discharge: 2013-07-10 | Disposition: A | Discharge status: Home / Self Care | Payer: BC Managed Care – PPO | Attending: Emergency Medicine | Admitting: Emergency Medicine

## 2013-07-10 ENCOUNTER — Encounter (HOSPITAL_COMMUNITY): Payer: Self-pay | Admitting: Emergency Medicine

## 2013-07-10 ENCOUNTER — Emergency Department (HOSPITAL_COMMUNITY): Payer: BC Managed Care – PPO

## 2013-07-10 DIAGNOSIS — E039 Hypothyroidism, unspecified: Secondary | ICD-10-CM | POA: Insufficient documentation

## 2013-07-10 DIAGNOSIS — F329 Major depressive disorder, single episode, unspecified: Secondary | ICD-10-CM | POA: Insufficient documentation

## 2013-07-10 DIAGNOSIS — M109 Gout, unspecified: Secondary | ICD-10-CM | POA: Insufficient documentation

## 2013-07-10 DIAGNOSIS — Z862 Personal history of diseases of the blood and blood-forming organs and certain disorders involving the immune mechanism: Secondary | ICD-10-CM | POA: Insufficient documentation

## 2013-07-10 DIAGNOSIS — F3289 Other specified depressive episodes: Secondary | ICD-10-CM | POA: Insufficient documentation

## 2013-07-10 DIAGNOSIS — Z8669 Personal history of other diseases of the nervous system and sense organs: Secondary | ICD-10-CM | POA: Insufficient documentation

## 2013-07-10 DIAGNOSIS — Z79899 Other long term (current) drug therapy: Secondary | ICD-10-CM | POA: Insufficient documentation

## 2013-07-10 DIAGNOSIS — I10 Essential (primary) hypertension: Secondary | ICD-10-CM | POA: Insufficient documentation

## 2013-07-10 DIAGNOSIS — I509 Heart failure, unspecified: Secondary | ICD-10-CM | POA: Insufficient documentation

## 2013-07-10 DIAGNOSIS — R197 Diarrhea, unspecified: Secondary | ICD-10-CM | POA: Insufficient documentation

## 2013-07-10 DIAGNOSIS — R42 Dizziness and giddiness: Secondary | ICD-10-CM | POA: Insufficient documentation

## 2013-07-10 DIAGNOSIS — Z8701 Personal history of pneumonia (recurrent): Secondary | ICD-10-CM | POA: Insufficient documentation

## 2013-07-10 DIAGNOSIS — Z8571 Personal history of Hodgkin lymphoma: Secondary | ICD-10-CM | POA: Insufficient documentation

## 2013-07-10 LAB — CBC WITH DIFFERENTIAL/PLATELET
BASOS PCT: 0 % (ref 0–1)
Basophils Absolute: 0 10*3/uL (ref 0.0–0.1)
EOS ABS: 0.2 10*3/uL (ref 0.0–0.7)
Eosinophils Relative: 2 % (ref 0–5)
HEMATOCRIT: 40.4 % (ref 36.0–46.0)
HEMOGLOBIN: 14.3 g/dL (ref 12.0–15.0)
LYMPHS ABS: 3.8 10*3/uL (ref 0.7–4.0)
Lymphocytes Relative: 37 % (ref 12–46)
MCH: 34.8 pg — AB (ref 26.0–34.0)
MCHC: 35.4 g/dL (ref 30.0–36.0)
MCV: 98.3 fL (ref 78.0–100.0)
MONO ABS: 0.9 10*3/uL (ref 0.1–1.0)
MONOS PCT: 9 % (ref 3–12)
NEUTROS ABS: 5.3 10*3/uL (ref 1.7–7.7)
Neutrophils Relative %: 51 % (ref 43–77)
Platelets: 494 10*3/uL — ABNORMAL HIGH (ref 150–400)
RBC: 4.11 MIL/uL (ref 3.87–5.11)
RDW: 13.2 % (ref 11.5–15.5)
WBC: 10.2 10*3/uL (ref 4.0–10.5)

## 2013-07-10 LAB — COMPREHENSIVE METABOLIC PANEL
ALBUMIN: 3.8 g/dL (ref 3.5–5.2)
ALK PHOS: 92 U/L (ref 39–117)
ALT: 24 U/L (ref 0–35)
AST: 31 U/L (ref 0–37)
BILIRUBIN TOTAL: 0.8 mg/dL (ref 0.3–1.2)
BUN: 18 mg/dL (ref 6–23)
CHLORIDE: 101 meq/L (ref 96–112)
CO2: 26 mEq/L (ref 19–32)
CREATININE: 0.86 mg/dL (ref 0.50–1.10)
Calcium: 10.2 mg/dL (ref 8.4–10.5)
GFR calc non Af Amer: 71 mL/min — ABNORMAL LOW (ref >90)
GFR, EST AFRICAN AMERICAN: 82 mL/min — AB (ref >90)
GLUCOSE: 93 mg/dL (ref 70–99)
POTASSIUM: 5 meq/L (ref 3.7–5.3)
Sodium: 139 mEq/L (ref 137–147)
Total Protein: 8.5 g/dL — ABNORMAL HIGH (ref 6.0–8.3)

## 2013-07-10 LAB — PRO B NATRIURETIC PEPTIDE: PRO B NATRI PEPTIDE: 625 pg/mL — AB (ref 0–125)

## 2013-07-10 LAB — TROPONIN I

## 2013-07-10 NOTE — ED Provider Notes (Signed)
CSN: [id number]     Arrival date & time 07/10/13  1223 History   First MD Initiated Contact with Patient 07/10/13 1246     Chief Complaint  Patient presents with  . Hypotension  . Diarrhea     (Consider location/radiation/quality/duration/timing/severity/associated sxs/prior Treatment) Patient is a 63 y.o. female presenting with dizziness.  Dizziness Quality:  Lightheadedness Severity:  Moderate Onset quality:  Sudden Duration: minutes. Timing:  Intermittent Progression since onset: Yesterday was worst episode. Chronicity:  Recurrent Context comment:  Not worse upon standing.   Relieved by:  Nothing Worsened by:  Nothing tried Ineffective treatments:  None tried Associated symptoms: diarrhea (described as mild, a few loose stools a day for a few days.) and shortness of breath (occasionally, none currently)   Associated symptoms: no chest pain, no nausea, no syncope and no vomiting     Past Medical History  Diagnosis Date  . Cardiomyopathy   . CHF (congestive heart failure)   . Depression   . Hypothyroidism   . Hypertension     jay varanasi    . Gout   . Hodgkin disease     had rad' and chemo  in 33  . HOH (hard of hearing)     has hearing aids  . Headache(784.0) 07/2012    otc med prn = none in the last 8 yrs   . Anemia     history only  . History of blood transfusion 1977    at [facility] - unsure of number of units  . Pneumonia 08/09/2012    on abx x 5 days   Past Surgical History  Procedure Laterality Date  . Hysteroscopy w/d&c    . [name]      has earing aids  . Colonoscopy w/ polypectomy    . Lumbar fusion  07/2011    T11-L1 posterior lateral  . Cholecystectomy    . Dilation and curettage of uterus    . Abdominal exploration surgery  1970    r/t hodgkins disease - had chemo and rad  . Left knee surgery    . Colposcopy N/A 08/14/2012    Procedure: COLPOSCOPY;  Surgeon: [name], MD;  Location: [facility];  Service: Gynecology;  Laterality: N/A;  Colpo of  Vulva and Vagina  . Lesion removal N/A 08/14/2012    Procedure: EXCISION VAGINAL LESION;  Surgeon: [name], MD;  Location: [facility];  Service: Gynecology;  Laterality: N/A;  Wide Local Excision of Right Labia Majori  . Rectal biopsy N/A 08/14/2012    Procedure: BIOPSY RECTAL;  Surgeon: [name], MD;  Location: [facility];  Service: Gynecology;  Laterality: N/A;  peri-anal biopsy   Family History  Problem Relation Age of Onset  . Hypertension Father    History  Substance Use Topics  . Smoking status: Never Smoker   . Smokeless tobacco: Never Used  . Alcohol Use: Yes     Comment: weekends   OB History   Grav Para Term Preterm Abortions TAB SAB Ect Mult Living   1 0             Review of Systems  Constitutional: Negative for fever.  HENT: Negative for congestion.   Respiratory: Positive for shortness of breath (occasionally, none currently). Negative for cough.   Cardiovascular: Negative for chest pain and syncope.  Gastrointestinal: Positive for diarrhea (described as mild, a few loose stools a day for a few days.). Negative for nausea, vomiting and abdominal pain.  Neurological: Positive  for dizziness.  All other systems reviewed and are negative.      Allergies  Codeine  Home Medications   Current Outpatient Rx  Name  Route  Sig  Dispense  Refill  . allopurinol (ZYLOPRIM) 100 MG tablet   Oral   Take 100 mg by mouth 2 (two) times daily.         . carvedilol (COREG) 12.5 MG tablet   Oral   Take 12.5 mg by mouth 2 (two) times daily with a meal.         . esomeprazole (NEXIUM) 40 MG capsule   Oral   Take 40 mg by mouth daily at 12 noon.         . furosemide (LASIX) 20 MG tablet   Oral   Take 20 mg by mouth daily.         [name] levothyroxine (SYNTHROID, LEVOTHROID) 125 MCG tablet   Oral   Take 125 mcg by mouth daily.         . nitroGLYCERIN (NITROSTAT) 0.4 MG SL tablet   Sublingual   Place 0.4 mg under the tongue every 5 (five) minutes as  needed. For chest pains         . potassium chloride (K-DUR,KLOR-CON) 10 MEQ tablet   Oral   Take 10 mEq by mouth daily.         . valsartan (DIOVAN) 80 MG tablet   Oral   Take 80 mg by mouth daily.         [name] venlafaxine XR (EFFEXOR-XR) 75 MG 24 hr capsule   Oral   Take 1 capsule (75 mg total) by mouth daily.   30 capsule   11    BP 99/50  Pulse 64  Temp(Src) 97.6 F (36.4 C) (Oral)  Resp 18  SpO2 96% Physical Exam  Nursing note and vitals reviewed. Constitutional: She is oriented to person, place, and time. She appears well-developed and well-nourished. No distress.  HENT:  Head: Normocephalic and atraumatic.  Mouth/Throat: Oropharynx is clear and moist.  Eyes: Conjunctivae are normal. Pupils are equal, round, and reactive to light. No scleral icterus.  Neck: Neck supple.  Cardiovascular: Normal rate, regular rhythm, normal heart sounds and intact distal pulses.   No murmur heard. Pulmonary/Chest: Effort normal and breath sounds normal. No stridor. No respiratory distress. She has no rales.  Abdominal: Soft. Bowel sounds are normal. She exhibits no distension. There is no tenderness.  Musculoskeletal: Normal range of motion. She exhibits no edema.  Neurological: She is alert and oriented to person, place, and time.  Skin: Skin is warm and dry. No rash noted.  Psychiatric: She has a normal mood and affect. Her behavior is normal.    ED Course  Procedures (including critical care time) Labs Review Labs Reviewed  CBC WITH DIFFERENTIAL - Abnormal; Notable for the following:    MCH 34.8 (*)    Platelets 494 (*)    All other components within normal limits  COMPREHENSIVE METABOLIC PANEL - Abnormal; Notable for the following:    Total Protein 8.5 (*)    GFR calc non Af Amer 71 (*)    GFR calc Af Amer 82 (*)    All other components within normal limits  PRO B NATRIURETIC PEPTIDE - Abnormal; Notable for the following:    Pro B Natriuretic peptide (BNP) 625.0 (*)     All other components within normal limits  TROPONIN I   Imaging Review Dg Chest 2 View  07/10/2013  CLINICAL DATA Shortness of breath and hypotension  EXAM CHEST  2 VIEW  COMPARISON Sep 05, 2012  FINDINGS There is a new ill-defined nodular opacity in the left upper lobe measuring 1.2 x 0.8 cm. Lungs elsewhere are clear. Heart size and pulmonary vascularity are normal. No adenopathy. There is postoperative change in the thoracolumbar region in an area of previous compression fracture; this area appear stable.  IMPRESSION New nodular opacity left upper lobe. Advise correlation with noncontrast enhanced chest CT to further evaluate.  No edema or consolidation.  SIGNATURE  Electronically Signed   By: Lowella Grip M.D.   On: 07/10/2013 14:05  All radiology studies independently viewed by me.      EKG Interpretation   Date/Time:  Wednesday July 10 2013 12:48:57 EDT Ventricular Rate:  81 PR Interval:  203 QRS Duration: 138 QT Interval:  430 QTC Calculation: 499 R Axis:   -63 Text Interpretation:  Sinus rhythm Left bundle branch block Baseline  wander No significant change was found Confirmed by [facility]  MD, TREY  ([id number]) on 07/10/2013 8:47:05 PM      MDM   Final diagnoses:  Dizziness  Diarrhea    63 yo female presenting from PCPs office secondary to dizziness and hypotension.  She reported that she has had many episodes of dizziness over the past few weeks of dizziness.  Yesterday, the episode was worse, so she called EMS.  At that time, she declined transport to ED.  Today, she went to her doctor's office and where she reports she had low blood pressure and was transferred to the ED.  During her ED course, she stated she felt well and her BPs normalized.  She complained of some mild diarrhea over the past few days.  She was able to rehydrate by mouth.  No fevers, no abdominal pain, no chest pain, no syncope.  Labwork was unremarkable.  CXR showed a pulmonary nodule, which was  communicated to her.  She has been taking carvedilol and valsartan for her his of CHF and was recently started on lasix.  Her hypotension seems most likely a result of mild diarrhea/volume loss while taking these medications.  She was advised to follow up closely with her PCP and Cardiologist for medication adjustments.    On multiple rechecks, pt's blood pressure was 110-120/60-70, including at time of my discharge exam.  She was given return precautions and agreed with the plan of treatment.   Houston Siren III, MD 07/10/13 2055

## 2013-07-10 NOTE — ED Notes (Signed)
MD at bedside. 

## 2013-07-10 NOTE — ED Notes (Signed)
Pt from [facility], c/o hypotension, dizzy when standing, lethargy, and diarrhea for 3 days. NSD

## 2013-07-10 NOTE — Discharge Instructions (Signed)
Dizziness Dizziness is a common problem. It is a feeling of unsteadiness or lightheadedness. You may feel like you are about to faint. Dizziness can lead to injury if you stumble or fall. A person of any age group can suffer from dizziness, but dizziness is more common in older adults. CAUSES  Dizziness can be caused by many different things, including:  Middle ear problems.  Standing for too long.  Infections.  An allergic reaction.  Aging.  An emotional response to something, such as the sight of blood.  Side effects of medicines.  Fatigue.  Problems with circulation or blood pressure.  Excess use of alcohol, medicines, or illegal drug use.  Breathing too fast (hyperventilation).  An arrhythmia or problems with your heart rhythm.  Low red blood cell count (anemia).  Pregnancy.  Vomiting, diarrhea, fever, or other illnesses that cause dehydration.  Diseases or conditions such as Parkinson's disease, high blood pressure (hypertension), diabetes, and thyroid problems.  Exposure to extreme heat. DIAGNOSIS  To find the cause of your dizziness, your caregiver may do a physical exam, lab tests, radiologic imaging scans, or an electrocardiography test (ECG).  TREATMENT  Treatment of dizziness depends on the cause of your symptoms and can vary greatly. HOME CARE INSTRUCTIONS   Drink enough fluids to keep your urine clear or pale yellow. This is especially important in very hot weather. In the elderly, it is also important in cold weather.  If your dizziness is caused by medicines, take them exactly as directed. When taking blood pressure medicines, it is especially important to get up slowly.  Rise slowly from chairs and steady yourself until you feel okay.  In the morning, first sit up on the side of the bed. When this seems okay, stand slowly while holding onto something until you know your balance is fine.  If you need to stand in one place for a long time, be sure to  move your legs often. Tighten and relax the muscles in your legs while standing.  If dizziness continues to be a problem, have someone stay with you for a day or two. Do this until you feel you are well enough to stay alone. Have the person call your caregiver if he or she notices changes in you that are concerning.  Do not drive or use heavy machinery if you feel dizzy.  Do not drink alcohol. SEEK IMMEDIATE MEDICAL CARE IF:   Your dizziness or lightheadedness gets worse.  You feel nauseous or vomit.  You develop problems with talking, walking, weakness, or using your arms, hands, or legs.  You are not thinking clearly or you have difficulty forming sentences. It may take a friend or family member to determine if your thinking is normal.  You develop chest pain, abdominal pain, shortness of breath, or sweating.  Your vision changes.  You notice any bleeding.  You have side effects from medicine that seems to be getting worse rather than better. MAKE SURE YOU:   Understand these instructions.  Will watch your condition.  Will get help right away if you are not doing well or get worse. Document Released: 10/12/2000 Document Revised: 07/11/2011 Document Reviewed: 11/05/2010 [facility] Patient Information 2014 [place], [place].  Diarrhea Diarrhea is frequent loose and watery bowel movements. It can cause you to feel weak and dehydrated. Dehydration can cause you to become tired and thirsty, have a dry mouth, and have decreased urination that often is dark yellow. Diarrhea is a sign of another problem, most often  an infection that will not last long. In most cases, diarrhea typically lasts 2 3 days. However, it can last longer if it is a sign of something more serious. It is important to treat your diarrhea as directed by your caregive to lessen or prevent future episodes of diarrhea. CAUSES  Some common causes include:  Gastrointestinal infections caused by viruses, bacteria, or  parasites.  Food poisoning or food allergies.  Certain medicines, such as antibiotics, chemotherapy, and laxatives.  Artificial sweeteners and fructose.  Digestive disorders. HOME CARE INSTRUCTIONS  Ensure adequate fluid intake (hydration): have 1 cup (8 oz) of fluid for each diarrhea episode. Avoid fluids that contain simple sugars or sports drinks, fruit juices, whole milk products, and sodas. Your urine should be clear or pale yellow if you are drinking enough fluids. Hydrate with an oral rehydration solution that you can purchase at pharmacies, retail stores, and online. You can prepare an oral rehydration solution at home by mixing the following ingredients together:    tsp table salt.   tsp baking soda.   tsp salt substitute containing potassium chloride.  1  tablespoons sugar.  1 L (34 oz) of water.  Certain foods and beverages may increase the speed at which food moves through the gastrointestinal (GI) tract. These foods and beverages should be avoided and include:  Caffeinated and alcoholic beverages.  High-fiber foods, such as raw fruits and vegetables, nuts, seeds, and whole grain breads and cereals.  Foods and beverages sweetened with sugar alcohols, such as xylitol, sorbitol, and mannitol.  Some foods may be well tolerated and may help thicken stool including:  Starchy foods, such as rice, toast, pasta, low-sugar cereal, oatmeal, grits, baked potatoes, crackers, and bagels.  Bananas.  Applesauce.  Add probiotic-rich foods to help increase healthy bacteria in the GI tract, such as yogurt and fermented milk products.  Wash your hands well after each diarrhea episode.  Only take over-the-counter or prescription medicines as directed by your caregiver.  Take a warm bath to relieve any burning or pain from frequent diarrhea episodes. SEEK IMMEDIATE MEDICAL CARE IF:   You are unable to keep fluids down.  You have persistent vomiting.  You have blood in your  stool, or your stools are black and tarry.  You do not urinate in 6 8 hours, or there is only a small amount of very dark urine.  You have abdominal pain that increases or localizes.  You have weakness, dizziness, confusion, or lightheadedness.  You have a severe headache.  Your diarrhea gets worse or does not get better.  You have a fever or persistent symptoms for more than 2 3 days.  You have a fever and your symptoms suddenly get worse. MAKE SURE YOU:   Understand these instructions.  Will watch your condition.  Will get help right away if you are not doing well or get worse. Document Released: 04/08/2002 Document Revised: 04/04/2012 Document Reviewed: 12/25/2011 [facility] Patient Information 2014 [place], [place].

## 2013-07-11 ENCOUNTER — Telehealth: Payer: Self-pay | Admitting: Interventional Cardiology

## 2013-07-11 ENCOUNTER — Ambulatory Visit
Admission: RE | Admit: 2013-07-11 | Discharge: 2013-07-11 | Disposition: A | Discharge status: Home / Self Care | Payer: BC Managed Care – PPO | Source: Ambulatory Visit | Attending: Physician Assistant | Admitting: Physician Assistant

## 2013-07-11 ENCOUNTER — Ambulatory Visit (INDEPENDENT_AMBULATORY_CARE_PROVIDER_SITE_OTHER): Payer: BC Managed Care – PPO | Admitting: Physician Assistant

## 2013-07-11 ENCOUNTER — Ambulatory Visit
Admission: RE | Admit: 2013-07-11 | Discharge: 2013-07-11 | Disposition: A | Discharge status: Home / Self Care | Payer: BC Managed Care – PPO | Source: Ambulatory Visit | Attending: Orthopedic Surgery | Admitting: Orthopedic Surgery

## 2013-07-11 ENCOUNTER — Encounter: Payer: Self-pay | Admitting: Physician Assistant

## 2013-07-11 VITALS — BP 108/69 | HR 91 | Ht 66.0 in | Wt 214.0 lb

## 2013-07-11 DIAGNOSIS — R911 Solitary pulmonary nodule: Secondary | ICD-10-CM

## 2013-07-11 DIAGNOSIS — R9389 Abnormal findings on diagnostic imaging of other specified body structures: Secondary | ICD-10-CM

## 2013-07-11 DIAGNOSIS — M25569 Pain in unspecified knee: Secondary | ICD-10-CM

## 2013-07-11 DIAGNOSIS — I428 Other cardiomyopathies: Secondary | ICD-10-CM | POA: Insufficient documentation

## 2013-07-11 DIAGNOSIS — I959 Hypotension, unspecified: Secondary | ICD-10-CM | POA: Insufficient documentation

## 2013-07-11 NOTE — Telephone Encounter (Signed)
New message     For last couple of days called ems to home  Regarding blood pressure .    Blood pressure today  100/66.   Yesterday 72/50?    No chest pain & No sob .

## 2013-07-11 NOTE — Progress Notes (Signed)
HPI:  This is a 63 year old female patient of Dr. Irish Lack who has a nonischemic cardiomyopathy secondary to radiation therapy and chemotherapy. Rest/stress myocardial perfusion scan on 09/04/12 EF was 35%, no ischemia noted.   She also has a history of Hodgkin's lymphoma, status post splenectomy in 1970, history of decompression of T3-T4 spinal schwannoma, hypothyroidism, and orthostatic hypotension.  The patient comes in today as an add-on. She has had several days of fatigue and malaise associated with low blood pressure. She also traveled by plane to [place] and then drove back. She's had diarrhea, with 3-4 loose stools daily since last Saturday.She became quite dizzy and went to her primary care Dr. Delfina Redwood who sent her to the emergency room for further evaluation.  In the emergency room she was told to followup with cardiology. Chest x-ray did show a new nodular opacity in the left upper lobe. Recommend correlation with noncontrast enhanced CT to further evaluate but this was not done. There was no edema or consolidation. Her BNP was also slightly elevated at 625. EKG showed normal sinus rhythm with left bundle branch block.  Today the patient tells me she was started on Lasix 2 weeks ago because of complaints of fatigue. She denied any edema, shortness of breath or high blood pressure. She didn't take Lasix yesterday or today and actually held her Coreg today. She is feeling better today and her blood pressures has come up a little.   Allergies -- Codeine -- Nausea And Vomiting  Current Outpatient Prescriptions on File Prior to Visit: allopurinol (ZYLOPRIM) 100 MG tablet, Take 100 mg by mouth 2 (two) times daily., Disp: , Rfl:  carvedilol (COREG) 12.5 MG tablet, Take 12.5 mg by mouth 2 (two) times daily with a meal., Disp: , Rfl:  esomeprazole (NEXIUM) 40 MG capsule, Take 40 mg by mouth daily at 12 noon., Disp: , Rfl:  furosemide (LASIX) 20 MG tablet, Take 20 mg by mouth daily., Disp: , Rfl:   levothyroxine (SYNTHROID, LEVOTHROID) 125 MCG tablet, Take 125 mcg by mouth daily., Disp: , Rfl:  nitroGLYCERIN (NITROSTAT) 0.4 MG SL tablet, Place 0.4 mg under the tongue every 5 (five) minutes as needed. For chest pains, Disp: , Rfl:  potassium chloride (K-DUR,KLOR-CON) 10 MEQ tablet, Take 10 mEq by mouth daily., Disp: , Rfl:  valsartan (DIOVAN) 80 MG tablet, Take 80 mg by mouth daily., Disp: , Rfl:  venlafaxine XR (EFFEXOR-XR) 75 MG 24 hr capsule, Take 1 capsule (75 mg total) by mouth daily., Disp: 30 capsule, Rfl: 11  No current facility-administered medications on file prior to visit.   Past Medical History:   Cardiomyopathy                                               CHF (congestive heart failure)                               Depression                                                   Hypothyroidism  Hypertension                                                   Comment:jay varanasi     Gout                                                         Hodgkin disease                                                Comment:had rad' and chemo  in 64   HOH (hard of hearing)                                          Comment:has hearing aids   Headache(784.0)                                 07/2012         Comment:otc med prn = none in the last 8 yrs    Anemia                                                         Comment:history only   History of blood transfusion                    1977           Comment:at [facility] - unsure of number of units   Pneumonia                                       08/09/2012      Comment:on abx x 5 days  Past Surgical History:   HYSTEROSCOPY W/D&C                                            hoh                                                             Comment:has earing aids   COLONOSCOPY W/ POLYPECTOMY                                    LUMBAR FUSION  07/2011          Comment:T11-L1 posterior lateral   CHOLECYSTECTOMY                                               DILATION AND CURETTAGE OF UTERUS                              ABDOMINAL EXPLORATION SURGERY                    1970           Comment:r/t hodgkins disease - had chemo and rad   left knee surgery                                             COLPOSCOPY                                      N/A 08/14/2012      Comment:Procedure: COLPOSCOPY;  Surgeon: [name], MD;  Location: [facility];  Service:               Gynecology;  Laterality: N/A;  Colpo of Vulva               and Vagina   LESION REMOVAL                                  N/A 08/14/2012      Comment:Procedure: EXCISION VAGINAL LESION;  Surgeon:               [name], MD;  Location: [facility];                Service: Gynecology;  Laterality: N/A;  Wide               Local Excision of Right Labia Majori   RECTAL BIOPSY                                   N/A 08/14/2012      Comment:Procedure: BIOPSY RECTAL;  Surgeon: [name], MD;  Location: [facility];  Service:               Gynecology;  Laterality: N/A;  peri-anal biopsy  Review of patient's family history indicates:   Hypertension                   Father                   Social History   Marital Status: Single              Spouse Name:                      Years of Education:  Number of children:             Occupational History   None on file  Social History Main Topics   Smoking Status: Never Smoker                     Smokeless Status: Never Used                       Alcohol Use: Yes               Comment: weekends   Drug Use: Yes               Special: Marijuana      Comment: for 8 yrs - quit 26 yrs ago   Sexual Activity: Not on file        Other Topics            Concern   None on file  Social History Narrative   None on file    ROS: See history of present illness otherwise negative   PHYSICAL EXAM:  Well-nournished, in no acute distress. Neck: No JVD, HJR, Bruit, or thyroid enlargement  Lungs: No tachypnea, clear without wheezing, rales, or rhonchi  Cardiovascular: RRR, PMI not displaced, heart sounds normal, no murmurs, gallops, bruit, thrill, or heave.  Abdomen: BS normal. Soft without organomegaly, masses, lesions or tenderness.  Extremities: without cyanosis, clubbing or edema. Good distal pulses bilateral  SKin: Warm, no lesions or rashes   Musculoskeletal: No deformities  Neuro: no focal signs  BP 108/69  Pulse 91  Ht 5\' 6"  (1.676 m)  Wt 214 lb (97.07 kg)  BMI 34.56 kg/m2 Orthostatic blood pressures were done and she is not orthostatic.

## 2013-07-11 NOTE — Telephone Encounter (Signed)
Appt made with Sylvia Moore today. Pt aware to head to our office. She is currently feeling okay and denies dizziness at this time.

## 2013-07-11 NOTE — Assessment & Plan Note (Signed)
Patient has had dizziness with hypotension for several days now. She was started on Lasix 20 mg 2 weeks ago. She also developed diarrhea last Saturday. I believe the combination caused her to be come dehydrated and hypotensive. She feels better since she's been holding the Lasix. I will stop her Lasix completely. If her blood pressure is below 294 systolic she is told to decrease her Diovan to 40 mg daily. She will followup with Dr. Irish Lack in 2 weeks.

## 2013-07-11 NOTE — Assessment & Plan Note (Signed)
Patient has history of nonischemic cardiomyopathy secondary to radiation therapy and chemotherapy. Her last ejection fraction was 35% on rest stress myocardial perfusion scan 09/04/12. No complaints of chest pain or shortness of breath

## 2013-07-11 NOTE — Assessment & Plan Note (Signed)
Patient has a nodular opacity in her left upper lobe on chest x-ray in the emergency room. They recommend a noncontrast CT to further evaluate. We will order this and have her follow up with Dr. Irish Lack and primary.

## 2013-07-11 NOTE — Telephone Encounter (Signed)
Pt hasnt taken lasix in 2 days because of possible dehydration.

## 2013-07-11 NOTE — Telephone Encounter (Signed)
FYI to Dr. Varanasi.  

## 2013-07-11 NOTE — Telephone Encounter (Signed)
Spoke with pt and EMS told pt it was probably dehydration. She went to see Dr. Delfina Redwood yesterday and BP went down to 70/50 and they sent her to the ED. Today she hasnt taken any off her cardiac/bp meds and her BP is 100/60.

## 2013-07-11 NOTE — Patient Instructions (Signed)
STOP LASIX  STOP POTASSIUM  MONITOR BLOOD PRESSURE AND IF SYSTOLIC BLOOD PRESSURE IS BELOW 100 THEN CUT BACK DIOVAN TO 58 MG DAILY  Your physician recommends that you schedule a follow-up appointment in: 2:45 ON 07/23/13 WITH DR. VARANASI OK PER AMY

## 2013-07-17 ENCOUNTER — Encounter: Payer: Self-pay | Admitting: Interventional Cardiology

## 2013-07-23 ENCOUNTER — Ambulatory Visit (INDEPENDENT_AMBULATORY_CARE_PROVIDER_SITE_OTHER): Payer: BC Managed Care – PPO | Admitting: Interventional Cardiology

## 2013-07-23 ENCOUNTER — Encounter: Payer: Self-pay | Admitting: Interventional Cardiology

## 2013-07-23 ENCOUNTER — Encounter: Payer: Self-pay | Admitting: Cardiology

## 2013-07-23 VITALS — BP 100/66 | HR 96 | Ht 66.0 in | Wt 220.0 lb

## 2013-07-23 DIAGNOSIS — R0602 Shortness of breath: Secondary | ICD-10-CM

## 2013-07-23 DIAGNOSIS — E782 Mixed hyperlipidemia: Secondary | ICD-10-CM | POA: Insufficient documentation

## 2013-07-23 DIAGNOSIS — I428 Other cardiomyopathies: Secondary | ICD-10-CM

## 2013-07-23 DIAGNOSIS — I10 Essential (primary) hypertension: Secondary | ICD-10-CM

## 2013-07-23 NOTE — Patient Instructions (Signed)
Your physician recommends that you return for lab work today for BNP and Bmet.   Your physician recommends that you continue on your current medications as directed. Please refer to the Current Medication list given to you today.  Your physician wants you to follow-up in: 4 months with Dr. Irish Lack. You will receive a reminder letter in the mail two months in advance. If you don't receive a letter, please call our office to schedule the follow-up appointment.

## 2013-07-23 NOTE — Progress Notes (Signed)
Patient ID: Sylvia Moore, female   DOB: Jan 03, 1951, 63 y.o.   MRN: [id number] HPI:  This is a 63 year old female patient of Dr. Irish Lack who has a nonischemic cardiomyopathy secondary to radiation therapy and chemotherapy. Rest/stress myocardial perfusion scan on 09/04/12 EF was 35%, no ischemia noted.   She also has a history of Hodgkin's lymphoma, status post splenectomy in 1970, history of decompression of T3-T4 spinal schwannoma, hypothyroidism, and orthostatic hypotension.  The patient came in  A few days ago as an add-on. She has had several days of fatigue and malaise associated with low blood pressure. She also traveled by plane to [place] and then drove back. She's had diarrhea, with 3-4 loose stools daily since last Saturday.She became quite dizzy and went to her primary care Dr. Delfina Redwood who sent her to the emergency room for further evaluation.  In the emergency room she was told to followup with cardiology. Chest x-ray did show a new nodular opacity in the left upper lobe. Recommend correlation with noncontrast enhanced CT to further evaluate but this was not done. There was no edema or consolidation. Her BNP was also slightly elevated at 625. EKG showed normal sinus rhythm with left bundle branch block.  Today the patient tells me she was started on Lasix in late Feb 2015 ago because of complaints of fatigue. She denied any edema, shortness of breath or high blood pressure. BP got low so it was stopped 2 weeks ago, in 3/15.  She feels better but now reports coughing while lying flat and notes a wheezing/gurgling sound.  She sleeps on more pillows over the past week.    Allergies -- Codeine -- Nausea And Vomiting  Current Outpatient Prescriptions on File Prior to Visit: allopurinol (ZYLOPRIM) 100 MG tablet, Take 100 mg by mouth 2 (two) times daily., Disp: , Rfl:  carvedilol (COREG) 12.5 MG tablet, Take 12.5 mg by mouth 2 (two) times daily with a meal., Disp: , Rfl:  esomeprazole (NEXIUM)  40 MG capsule, Take 40 mg by mouth daily at 12 noon., Disp: , Rfl:  furosemide (LASIX) 20 MG tablet, Take 20 mg by mouth daily., Disp: , Rfl:  levothyroxine (SYNTHROID, LEVOTHROID) 125 MCG tablet, Take 125 mcg by mouth daily., Disp: , Rfl:  nitroGLYCERIN (NITROSTAT) 0.4 MG SL tablet, Place 0.4 mg under the tongue every 5 (five) minutes as needed. For chest pains, Disp: , Rfl:  potassium chloride (K-DUR,KLOR-CON) 10 MEQ tablet, Take 10 mEq by mouth daily., Disp: , Rfl:  valsartan (DIOVAN) 80 MG tablet, Take 80 mg by mouth daily., Disp: , Rfl:  venlafaxine XR (EFFEXOR-XR) 75 MG 24 hr capsule, Take 1 capsule (75 mg total) by mouth daily., Disp: 30 capsule, Rfl: 11  No current facility-administered medications on file prior to visit.   Past Medical History:   Cardiomyopathy                                               CHF (congestive heart failure)                               Depression  Hypothyroidism                                               Hypertension                                                   Comment:jay Brittanee Ghazarian     Gout                                                         Hodgkin disease                                                Comment:had rad' and chemo  in 54   HOH (hard of hearing)                                          Comment:has hearing aids   Headache(784.0)                                 07/2012         Comment:otc med prn = none in the last 8 yrs    Anemia                                                         Comment:history only   History of blood transfusion                    1977           Comment:at [facility] - unsure of number of units   Pneumonia                                       08/09/2012      Comment:on abx x 5 days  Past Surgical History:   HYSTEROSCOPY W/D&C                                            hoh                                                             Comment:has earing  aids   COLONOSCOPY W/ POLYPECTOMY  LUMBAR FUSION                                    07/2011         Comment:T11-L1 posterior lateral   CHOLECYSTECTOMY                                               DILATION AND CURETTAGE OF UTERUS                              ABDOMINAL EXPLORATION SURGERY                    1970           Comment:r/t hodgkins disease - had chemo and rad   left knee surgery                                             COLPOSCOPY                                      N/A 08/14/2012      Comment:Procedure: COLPOSCOPY;  Surgeon: [name], MD;  Location: [facility];  Service:               Gynecology;  Laterality: N/A;  Colpo of Vulva               and Vagina   LESION REMOVAL                                  N/A 08/14/2012      Comment:Procedure: EXCISION VAGINAL LESION;  Surgeon:               [name], MD;  Location: [facility];                Service: Gynecology;  Laterality: N/A;  Wide               Local Excision of Right Labia Majori   RECTAL BIOPSY                                   N/A 08/14/2012      Comment:Procedure: BIOPSY RECTAL;  Surgeon: [name], MD;  Location: [facility];  Service:               Gynecology;  Laterality: N/A;  peri-anal biopsy  Review of patient's family history indicates:   Hypertension                   Father                   Social History   Marital Status: Single  Spouse Name:                      Years of Education:                 Number of children:             Occupational History   None on file  Social History Main Topics   Smoking Status: Never Smoker                     Smokeless Status: Never Used                       Alcohol Use: Yes               Comment: weekends   Drug Use: Yes               Special: Marijuana      Comment: for 8 yrs - quit 26 yrs ago   Sexual Activity: Not on file        Other Topics            Concern   None on  file  Social History Narrative   None on file    ROS: See history of present illness otherwise negative   PHYSICAL EXAM: Well-nournished, in no acute distress. Neck: No JVD, HJR, Bruit, or thyroid enlargement  Lungs: No tachypnea, clear without wheezing, rales, or rhonchi  Cardiovascular: RRR, PMI not displaced, heart sounds normal, no murmurs, gallops, bruit, thrill, or heave.  Abdomen: BS normal. Soft without organomegaly, masses, lesions or tenderness.  Extremities: without cyanosis, clubbing or edema. Good distal pulses bilateral  SKin: Warm, no lesions or rashes   Musculoskeletal: No deformities  Neuro: no focal signs  BP 108/69  Pulse 91  Ht 5\' 6"  (1.676 m)  Wt 214 lb (97.07 kg)  BMI 34.56 kg/m2 Orthostatic blood pressures were done and she is not orthostatic.    ASSESSMENT/PLAN:  Other primary cardiomyopathies   Continue Carvedilol Tablet, 12.5 MG, 1 tablet with food, Orally, Twice a day, 30 days, 60, Refills 11         Notes: Last echo EF was 40-45%. DOE walking up hills is more prominent. Cardiolite   5/14: No ischemia and EF 35%  2. Hypertension, essential   Continue Diovan Tablet, 80 MG, TAKE 1 TABLET ONCE DAILY. Notes: Well controlled. Should be better on increased Coreg.      3. Hypercholesterolemia, Mixed   Notes: LDL<130 in 2013.           4. SHOB: Weight is increased as well.  Given orthopnea, concerned about fluid retention and CHF.  BNP at [facility] on 06/21/13 195.  Pro-BNP at [facility] on 07/10/13 :625 .  Check BNP and BMet.  May need some lower dose of lasix back to help with fluid overload.

## 2013-07-24 ENCOUNTER — Telehealth: Payer: Self-pay | Admitting: Cardiology

## 2013-07-24 DIAGNOSIS — Z79899 Other long term (current) drug therapy: Secondary | ICD-10-CM

## 2013-07-24 LAB — BASIC METABOLIC PANEL
BUN: 19 mg/dL (ref 6–23)
CALCIUM: 9.8 mg/dL (ref 8.4–10.5)
CO2: 30 mEq/L (ref 19–32)
CREATININE: 0.9 mg/dL (ref 0.4–1.2)
Chloride: 104 mEq/L (ref 96–112)
GFR: 66.46 mL/min (ref >60.00)
GLUCOSE: 109 mg/dL — AB (ref 70–99)
POTASSIUM: 4.8 meq/L (ref 3.5–5.1)
Sodium: 138 mEq/L (ref 135–145)

## 2013-07-24 LAB — BRAIN NATRIURETIC PEPTIDE: Pro B Natriuretic peptide (BNP): 419 pg/mL — ABNORMAL HIGH (ref 0.0–100.0)

## 2013-07-24 MED ORDER — FUROSEMIDE 40 MG PO TABS: ORAL_TABLET | ORAL | Status: DC

## 2013-07-24 NOTE — Telephone Encounter (Signed)
Pt notified. Meds updated and labs ordered.  

## 2013-07-24 NOTE — Telephone Encounter (Signed)
Message copied by Alcario Drought on Wed Jul 24, 2013  4:39 PM ------      Message from: [name]      Created: Wed Jul 24, 2013  4:08 PM       Elevated BNP. Start Lasix 40 mg daily for 3 days.  After that,can take it 3x/week.  BMet in one week.  Eat potassium rich foods ------

## 2013-08-02 ENCOUNTER — Ambulatory Visit (INDEPENDENT_AMBULATORY_CARE_PROVIDER_SITE_OTHER): Payer: BC Managed Care – PPO | Admitting: *Deleted

## 2013-08-02 ENCOUNTER — Telehealth: Payer: Self-pay | Admitting: *Deleted

## 2013-08-02 DIAGNOSIS — E871 Hypo-osmolality and hyponatremia: Secondary | ICD-10-CM

## 2013-08-02 DIAGNOSIS — Z79899 Other long term (current) drug therapy: Secondary | ICD-10-CM

## 2013-08-02 LAB — BASIC METABOLIC PANEL
BUN: 31 mg/dL — ABNORMAL HIGH (ref 6–23)
CALCIUM: 9.8 mg/dL (ref 8.4–10.5)
CO2: 24 mEq/L (ref 19–32)
Chloride: 92 mEq/L — ABNORMAL LOW (ref 96–112)
Creat: 1.44 mg/dL — ABNORMAL HIGH (ref 0.50–1.10)
Glucose, Bld: 104 mg/dL — ABNORMAL HIGH (ref 70–99)
Potassium: 4.2 mEq/L (ref 3.5–5.3)
SODIUM: 124 meq/L — AB (ref 135–145)

## 2013-08-02 NOTE — Telephone Encounter (Signed)
Spoke with Sultana PA and will hold lasix completely and gently increase fluids. Recheck Monday. Patient states will not be back in town until Wednesday so scheduled then. Did recommend if any nausea, vomiting, or mental changes to go to ED

## 2013-08-05 ENCOUNTER — Encounter: Payer: Self-pay | Admitting: Cardiology

## 2013-08-05 ENCOUNTER — Other Ambulatory Visit (INDEPENDENT_AMBULATORY_CARE_PROVIDER_SITE_OTHER): Payer: BC Managed Care – PPO

## 2013-08-05 DIAGNOSIS — E871 Hypo-osmolality and hyponatremia: Secondary | ICD-10-CM

## 2013-08-05 LAB — BASIC METABOLIC PANEL
BUN: 23 mg/dL (ref 6–23)
CHLORIDE: 101 meq/L (ref 96–112)
CO2: 31 meq/L (ref 19–32)
CREATININE: 0.9 mg/dL (ref 0.4–1.2)
Calcium: 9.9 mg/dL (ref 8.4–10.5)
GFR: 64.01 mL/min (ref >60.00)
Glucose, Bld: 124 mg/dL — ABNORMAL HIGH (ref 70–99)
POTASSIUM: 4.9 meq/L (ref 3.5–5.1)
SODIUM: 137 meq/L (ref 135–145)

## 2013-08-06 ENCOUNTER — Telehealth: Payer: Self-pay | Admitting: Cardiology

## 2013-08-06 ENCOUNTER — Telehealth: Payer: Self-pay | Admitting: Interventional Cardiology

## 2013-08-06 DIAGNOSIS — Z79899 Other long term (current) drug therapy: Secondary | ICD-10-CM

## 2013-08-06 MED ORDER — SPIRONOLACTONE 25 MG PO TABS: ORAL_TABLET | ORAL | Status: DC

## 2013-08-06 NOTE — Telephone Encounter (Signed)
Message copied by Alcario Drought on Tue Aug 06, 2013  2:21 PM ------      Message from: [name]      Created: Tue Aug 06, 2013  1:32 PM       Sodium better. Does not tolerate Lasix. Can try spironolactone 12.5 mg every other day.  Recheck BMet in a week. ------

## 2013-08-06 NOTE — Telephone Encounter (Signed)
Pt notified. Meds updated and labs ordered.  

## 2013-08-06 NOTE — Telephone Encounter (Signed)
Called pt with preliminary results.

## 2013-08-06 NOTE — Telephone Encounter (Signed)
New message ° ° ° ° °Want test results °

## 2013-08-07 ENCOUNTER — Other Ambulatory Visit: Payer: BC Managed Care – PPO

## 2013-08-14 ENCOUNTER — Other Ambulatory Visit (INDEPENDENT_AMBULATORY_CARE_PROVIDER_SITE_OTHER): Payer: BC Managed Care – PPO

## 2013-08-14 DIAGNOSIS — Z79899 Other long term (current) drug therapy: Secondary | ICD-10-CM

## 2013-08-14 LAB — BASIC METABOLIC PANEL
BUN: 24 mg/dL — AB (ref 6–23)
CHLORIDE: 103 meq/L (ref 96–112)
CO2: 27 meq/L (ref 19–32)
Calcium: 9.9 mg/dL (ref 8.4–10.5)
Creatinine, Ser: 1 mg/dL (ref 0.4–1.2)
GFR: 56.96 mL/min — ABNORMAL LOW (ref >60.00)
GLUCOSE: 105 mg/dL — AB (ref 70–99)
POTASSIUM: 4.5 meq/L (ref 3.5–5.1)
Sodium: 136 mEq/L (ref 135–145)

## 2013-08-19 ENCOUNTER — Telehealth: Payer: Self-pay | Admitting: Interventional Cardiology

## 2013-08-19 DIAGNOSIS — R0602 Shortness of breath: Secondary | ICD-10-CM

## 2013-08-19 MED ORDER — SPIRONOLACTONE 25 MG PO TABS: ORAL_TABLET | ORAL | Status: DC

## 2013-08-19 NOTE — Telephone Encounter (Signed)
Made DOD aware of pts symptoms. New order to instruct pt to take Sprinolactone 25 mg, take 1/2 tab every day and have pt come in for a BMET and BNP next Monday 08/26/13. Orders in EPIC  Pt verbalized med change and to come in for labs ON 4/27 Pt still advised to weigh daily and log, and take BP daily and log

## 2013-08-19 NOTE — Telephone Encounter (Signed)
FYi to Dr. Irish Lack.

## 2013-08-19 NOTE — Telephone Encounter (Signed)
New message    patient calling still waking up at night coughing , very sob with any exertion at all.

## 2013-08-19 NOTE — Telephone Encounter (Signed)
To Triage.

## 2013-08-19 NOTE — Telephone Encounter (Signed)
Pt states that she feels like she gets SOB easily when exerting (even when walking down the hallway).  No SOB at rest.  Coughing at night more frequently, even when [facility] is elevated. Pt states she was recently taken off of lasix because she couldn't tolerate it, and placed on sprinolactone 25mg  1/2 tab qod.  Denies peripheral edema, No recent weights.  Pt stated "I did have a 6 lb weight gain a couple of weeks ago, but I have not been keeping track of it." Pt c/o no chest pain, or acute distress. Pt at work at the moment. Instructed that she needs to weigh herself every morning for 1 week and log this each day. Also instructed pt to take BP everyday for 1 week and log this as well. Instructed pt to report findings to our office by next Monday.  Pt verbalized clear understanding of instructions given.  Instructed pt to go to ER  if she started experiencing any SOB while resting, sweating, fast HR, chest pain, or any acute distress, Also told pt to avoid foods high in Sodium to prevent fluid retention.

## 2013-08-26 ENCOUNTER — Other Ambulatory Visit (INDEPENDENT_AMBULATORY_CARE_PROVIDER_SITE_OTHER): Payer: BC Managed Care – PPO

## 2013-08-26 DIAGNOSIS — R0602 Shortness of breath: Secondary | ICD-10-CM

## 2013-08-26 LAB — BRAIN NATRIURETIC PEPTIDE: PRO B NATRI PEPTIDE: 321 pg/mL — AB (ref 0.0–100.0)

## 2013-08-26 LAB — BASIC METABOLIC PANEL
BUN: 23 mg/dL (ref 6–23)
CALCIUM: 10.2 mg/dL (ref 8.4–10.5)
CO2: 29 mEq/L (ref 19–32)
Chloride: 101 mEq/L (ref 96–112)
Creatinine, Ser: 0.8 mg/dL (ref 0.4–1.2)
GFR: 74.92 mL/min (ref >60.00)
Glucose, Bld: 95 mg/dL (ref 70–99)
Potassium: 4.5 mEq/L (ref 3.5–5.1)
Sodium: 137 mEq/L (ref 135–145)

## 2013-08-27 ENCOUNTER — Ambulatory Visit: Payer: BC Managed Care – PPO | Admitting: Interventional Cardiology

## 2013-08-27 ENCOUNTER — Telehealth: Payer: Self-pay | Admitting: Cardiology

## 2013-08-27 DIAGNOSIS — Z79899 Other long term (current) drug therapy: Secondary | ICD-10-CM

## 2013-08-27 MED ORDER — SPIRONOLACTONE 25 MG PO TABS: 25.0000 mg | ORAL_TABLET | Freq: Every day | ORAL | Status: DC

## 2013-08-27 NOTE — Telephone Encounter (Signed)
Message copied by Alcario Drought on Tue Aug 27, 2013  4:52 PM ------      Message from: [name]      Created: Tue Aug 27, 2013  4:00 PM       Increase spironolactone to 25 mg daily. Bmet in one week.            Nonfasting TG will not be accurate. ------

## 2013-08-27 NOTE — Telephone Encounter (Signed)
Pt notified, meds updated. Labs ordered.

## 2013-09-04 ENCOUNTER — Other Ambulatory Visit (INDEPENDENT_AMBULATORY_CARE_PROVIDER_SITE_OTHER): Payer: BC Managed Care – PPO

## 2013-09-04 DIAGNOSIS — Z79899 Other long term (current) drug therapy: Secondary | ICD-10-CM

## 2013-09-04 LAB — BASIC METABOLIC PANEL
BUN: 23 mg/dL (ref 6–23)
CALCIUM: 10.3 mg/dL (ref 8.4–10.5)
CO2: 23 mEq/L (ref 19–32)
Chloride: 103 mEq/L (ref 96–112)
Creatinine, Ser: 1.1 mg/dL (ref 0.4–1.2)
GFR: 53.94 mL/min — AB (ref >60.00)
GLUCOSE: 104 mg/dL — AB (ref 70–99)
Potassium: 4.3 mEq/L (ref 3.5–5.1)
SODIUM: 134 meq/L — AB (ref 135–145)

## 2013-09-05 ENCOUNTER — Other Ambulatory Visit: Payer: Self-pay | Admitting: Cardiology

## 2013-09-05 ENCOUNTER — Other Ambulatory Visit: Payer: Self-pay | Admitting: Interventional Cardiology

## 2013-09-05 DIAGNOSIS — Z79899 Other long term (current) drug therapy: Secondary | ICD-10-CM

## 2013-09-13 ENCOUNTER — Telehealth: Payer: Self-pay | Admitting: Interventional Cardiology

## 2013-09-13 NOTE — Telephone Encounter (Signed)
Pt aware and agreeable. Pt will call back with update in 1-2 weeks.

## 2013-09-13 NOTE — Telephone Encounter (Signed)
Effexor doesn't cause drops in her blood pressure, but can cause dizziness in up to 15-20% of patients.  Lexapro is one of the SSRIs least likely to cause dizziness.

## 2013-09-13 NOTE — Telephone Encounter (Signed)
Spoke with pt and in the mornings she does have some dizziness/lightheadedness. Pt can feel her BP dropping and this is before medication. Bp does start to trend up some after a couple of hours.

## 2013-09-13 NOTE — Telephone Encounter (Signed)
Check with Sylvia Moore if Effexor can lower BP.  Maybe she can switch to a different SSRI.

## 2013-09-13 NOTE — Telephone Encounter (Signed)
Per Dr. Irish Lack pt should continue to monitor BP and call back in 1-2 weeks with readings. Heart medications are very important and we do not want to stop unless absolutely necessary.

## 2013-09-13 NOTE — Telephone Encounter (Signed)
New message     Reporting bp readings to Amy.  This am 93/49 and 91/50.  She ate something and took it again---98/53.  Please advise

## 2013-09-13 NOTE — Telephone Encounter (Signed)
To Dr. Varanasi, please advise.  

## 2013-09-24 ENCOUNTER — Other Ambulatory Visit (INDEPENDENT_AMBULATORY_CARE_PROVIDER_SITE_OTHER): Payer: BC Managed Care – PPO

## 2013-09-24 DIAGNOSIS — Z79899 Other long term (current) drug therapy: Secondary | ICD-10-CM

## 2013-09-25 ENCOUNTER — Other Ambulatory Visit: Payer: BC Managed Care – PPO

## 2013-09-25 LAB — BASIC METABOLIC PANEL
BUN: 32 mg/dL — ABNORMAL HIGH (ref 6–23)
CHLORIDE: 106 meq/L (ref 96–112)
CO2: 24 mEq/L (ref 19–32)
CREATININE: 1.2 mg/dL (ref 0.4–1.2)
Calcium: 9.8 mg/dL (ref 8.4–10.5)
GFR: 47.36 mL/min — ABNORMAL LOW (ref >60.00)
Glucose, Bld: 90 mg/dL (ref 70–99)
POTASSIUM: 5.1 meq/L (ref 3.5–5.1)
Sodium: 135 mEq/L (ref 135–145)

## 2013-10-01 ENCOUNTER — Other Ambulatory Visit: Payer: Self-pay

## 2013-10-01 ENCOUNTER — Other Ambulatory Visit: Payer: Self-pay | Admitting: Interventional Cardiology

## 2013-10-01 MED ORDER — SPIRONOLACTONE 25 MG PO TABS
25.0000 mg | ORAL_TABLET | Freq: Every day | ORAL | Status: DC
Start: 2013-10-01 — End: 2014-01-01

## 2013-10-22 ENCOUNTER — Encounter: Payer: Self-pay | Admitting: *Deleted

## 2013-10-28 ENCOUNTER — Telehealth: Payer: Self-pay | Admitting: Interventional Cardiology

## 2013-10-28 NOTE — Telephone Encounter (Addendum)
Spoke with patient and she took medications around 11am and then about 2 pm pt started to feel dizzy and weak. Pt is drinking gatorade and lying down resting now. Pt states her BP usually runs 90's/70's. Currently pt feels okay and she has taken medication for the diarrhea. In the past pt has lightheadedness with the diarrhea.

## 2013-10-28 NOTE — Telephone Encounter (Signed)
Per DOD Dr. Meda Coffee hold Diovan and Coreg tonight and call in the am with an update on BP. Pt notified.

## 2013-10-28 NOTE — Telephone Encounter (Addendum)
Spoke with pt again to f/u on BP and it was 84/49 about 30 minutes ago. Pt currently feels better with only slight lightheadedness. Pt is due to take coreg, diovan and allopurinol about 9-10 pm tonight. Should she hold or go ahead and take?

## 2013-10-28 NOTE — Telephone Encounter (Signed)
New Message:  Pt is c/o being dizzy. States her BP is 84/49.Sylvia Moore Pt states she also had diarrhea today. Pt states she did not take her meds for the last 2 days.. She forgot.Sylvia Moore However, she took her meds this morning... Requests a call back from the nurse

## 2013-10-29 NOTE — Telephone Encounter (Signed)
Follow up    patient was told to call back with blood pressure reading 107/57   taken today.

## 2013-10-29 NOTE — Telephone Encounter (Signed)
Spoke with pt and she will go ahead and take morning medications sicne she hasnt taken them yet. She will continue current regimen and call back if symptoms arise again.

## 2013-10-29 NOTE — Telephone Encounter (Signed)
Patient just wanted to let you know her BP is back to normal and and she is feeling great this morning.

## 2013-10-29 NOTE — Telephone Encounter (Signed)
Lm on secure vm for pt to call back and that she should continue medications.

## 2013-10-30 ENCOUNTER — Other Ambulatory Visit: Payer: Self-pay | Admitting: Interventional Cardiology

## 2013-10-30 NOTE — Telephone Encounter (Signed)
lmtrc

## 2013-10-30 NOTE — Telephone Encounter (Signed)
Since hypotension intermittently has been an issue, would decrease Coreg to 6.25 mg BID.

## 2013-10-30 NOTE — Telephone Encounter (Signed)
Pt notified. Meds updated.

## 2013-12-04 ENCOUNTER — Ambulatory Visit (INDEPENDENT_AMBULATORY_CARE_PROVIDER_SITE_OTHER): Payer: BC Managed Care – PPO | Admitting: Interventional Cardiology

## 2013-12-04 ENCOUNTER — Encounter: Payer: Self-pay | Admitting: Interventional Cardiology

## 2013-12-04 VITALS — BP 112/60 | HR 94 | Ht 66.0 in | Wt 213.0 lb

## 2013-12-04 DIAGNOSIS — I428 Other cardiomyopathies: Secondary | ICD-10-CM

## 2013-12-04 DIAGNOSIS — I959 Hypotension, unspecified: Secondary | ICD-10-CM

## 2013-12-04 DIAGNOSIS — E782 Mixed hyperlipidemia: Secondary | ICD-10-CM

## 2013-12-04 DIAGNOSIS — I10 Essential (primary) hypertension: Secondary | ICD-10-CM

## 2013-12-04 NOTE — Progress Notes (Signed)
[facility], [place], [place]  [address] Phone: [phone] Fax:  [phone]  Date:  12/04/2013   ID:  Sylvia Moore, DOB June 24, 1950, MRN [id number]  PCP:  Kandice Hams, MD      History of Present Illness: Sylvia Moore is a 63 y.o. female who has a nonischemic cardiomyopathy secondary to radiation therapy and chemotherapy. Rest/stress myocardial perfusion scan on 09/04/12 EF was 35%, no ischemia noted.   She also has a history of Hodgkin's lymphoma, status post splenectomy in 1970, history of decompression of T3-T4 spinal schwannoma, hypothyroidism, and orthostatic hypotension.   She has had low blood pressure. Readings are better.  She makes sure to take meds with foods.  She denied any edema, shortness of breath or high blood pressure.   Wt Readings from Last 3 Encounters:  12/04/13 213 lb (96.616 kg)  07/23/13 220 lb (99.791 kg)  07/11/13 214 lb (97.07 kg)     Past Medical History  Diagnosis Date  . Cardiomyopathy   . CHF (congestive heart failure)   . Depression   . Hypothyroidism   . Hypertension     jay varanasi    . Gout   . Hodgkin disease     had rad' and chemo  in 14  . HOH (hard of hearing)     has hearing aids  . Headache(784.0) 07/2012    otc med prn = none in the last 8 yrs   . Anemia     history only  . History of blood transfusion 1977    at [facility] - unsure of number of units  . Pneumonia 08/09/2012    on abx x 5 days  . Orthostatic hypotension   . Schwannoma of spinal cord     T3-T4  . Iatrogenic hypotension     orthostatic  . NICM (nonischemic cardiomyopathy)     Current Outpatient Prescriptions  Medication Sig Dispense Refill  . allopurinol (ZYLOPRIM) 100 MG tablet Take 100 mg by mouth 2 (two) times daily.      [name] ALPRAZolam (XANAX) 0.25 MG tablet Take 0.25 mg by mouth at bedtime as needed for anxiety.      . carvedilol (COREG) 6.25 MG tablet Take 6.25 mg by mouth 2 (two) times daily with a meal.      . esomeprazole  (NEXIUM) 40 MG capsule Take 40 mg by mouth daily at 12 noon.      [name] levothyroxine (SYNTHROID, LEVOTHROID) 125 MCG tablet Take 125 mcg by mouth daily.      [name] spironolactone (ALDACTONE) 25 MG tablet Take 1 tablet (25 mg total) by mouth daily.  30 tablet  6  . valsartan (DIOVAN) 80 MG tablet TAKE 1 TABLET ONCE DAILY.  30 tablet  0  . venlafaxine XR (EFFEXOR-XR) 75 MG 24 hr capsule Take 1 capsule (75 mg total) by mouth daily.  30 capsule  11   No current facility-administered medications for this visit.    Allergies:    Allergies  Allergen Reactions  . Codeine Nausea And Vomiting    Social History:  The patient  reports that she has quit smoking. She has never used smokeless tobacco. She reports that she drinks alcohol. She reports that she uses illicit drugs (Marijuana).   Family History:  The patient's family history includes Hypertension in her father.   ROS:  Please see the history of present illness.  No nausea, vomiting.  No fevers, chills.  No focal weakness.  No dysuria. Dizziness and fluid overload sx much better.  Small spider veins noted.  Toes feel tight sometimes.   All other systems reviewed and negative.   PHYSICAL EXAM: VS:  BP 112/60  Pulse 94  Ht 5\' 6"  (1.676 m)  Wt 213 lb (96.616 kg)  BMI 34.40 kg/m2 Well nourished, well developed, in no acute distress HEENT: normal Neck: no JVD, no carotid bruits Cardiac:  normal S1, S2; RRR;  Lungs:  clear to auscultation bilaterally, no wheezing, rhonchi or rales Abd: soft, nontender, no hepatomegaly Ext: no edema Skin: warm and dry Neuro:   no focal abnormalities noted     ASSESSMENT AND PLAN:  1.  Other primary cardiomyopathies   Continue Carvedilol Tablet, 6.25 MG, 1 tablet with food, Orally, Twice a day, 30 days, 60, Refills 11 Appears euvolemic.  Elevate legs at night for "toe tightness" as noted above.         Notes: Last echo EF was 40-45% in 2012. DOE walking up hills is more prominent. Cardiolite   5/14: No  ischemia and EF 35%  2. Hypertension, essential   Continue Diovan Tablet, 80 MG, TAKE 1 TABLET ONCE DAILY. Notes: Well controlled.  Watch for low BP.  Hypotension was an issue a few months ago.  3. Hypercholesterolemia, Mixed   Notes: LDL<130 in 2013.           4. SHOB: Controlled at this time.  She exercises every other day with walking.  I think she can increase the intensity as she is only walking the dogs.  Did not tolerate lasix in the past due to hyponatremia.  Tolerating spironolactone.

## 2013-12-04 NOTE — Patient Instructions (Signed)
Your physician recommends that you continue on your current medications as directed. Please refer to the Current Medication list given to you today.  Your physician wants you to follow-up in: 1 year with Dr. Varanasi. You will receive a reminder letter in the mail two months in advance. If you don't receive a letter, please call our office to schedule the follow-up appointment.  

## 2013-12-05 ENCOUNTER — Other Ambulatory Visit: Payer: Self-pay | Admitting: Interventional Cardiology

## 2013-12-06 ENCOUNTER — Ambulatory Visit: Payer: BC Managed Care – PPO | Admitting: Interventional Cardiology

## 2014-01-01 ENCOUNTER — Encounter (HOSPITAL_COMMUNITY): Payer: Self-pay | Admitting: Pharmacy Technician

## 2014-01-08 ENCOUNTER — Encounter (HOSPITAL_COMMUNITY): Payer: Self-pay | Admitting: *Deleted

## 2014-01-20 ENCOUNTER — Encounter (HOSPITAL_COMMUNITY): Payer: BC Managed Care – PPO | Admitting: Anesthesiology

## 2014-01-20 ENCOUNTER — Ambulatory Visit (HOSPITAL_COMMUNITY)
Admission: RE | Admit: 2014-01-20 | Discharge: 2014-01-20 | Disposition: A | Discharge status: Home / Self Care | Payer: BC Managed Care – PPO | Source: Ambulatory Visit | Attending: Gastroenterology | Admitting: Gastroenterology

## 2014-01-20 ENCOUNTER — Encounter (HOSPITAL_COMMUNITY)
Admission: RE | Disposition: A | Discharge status: Home / Self Care | Payer: Self-pay | Source: Ambulatory Visit | Attending: Gastroenterology

## 2014-01-20 ENCOUNTER — Encounter (HOSPITAL_COMMUNITY): Payer: Self-pay

## 2014-01-20 ENCOUNTER — Ambulatory Visit (HOSPITAL_COMMUNITY): Payer: BC Managed Care – PPO | Admitting: Anesthesiology

## 2014-01-20 ENCOUNTER — Other Ambulatory Visit: Payer: Self-pay | Admitting: Gastroenterology

## 2014-01-20 DIAGNOSIS — H9190 Unspecified hearing loss, unspecified ear: Secondary | ICD-10-CM | POA: Insufficient documentation

## 2014-01-20 DIAGNOSIS — D126 Benign neoplasm of colon, unspecified: Secondary | ICD-10-CM | POA: Insufficient documentation

## 2014-01-20 DIAGNOSIS — Z885 Allergy status to narcotic agent status: Secondary | ICD-10-CM | POA: Insufficient documentation

## 2014-01-20 DIAGNOSIS — K219 Gastro-esophageal reflux disease without esophagitis: Secondary | ICD-10-CM | POA: Diagnosis not present

## 2014-01-20 DIAGNOSIS — Z8571 Personal history of Hodgkin lymphoma: Secondary | ICD-10-CM | POA: Diagnosis not present

## 2014-01-20 DIAGNOSIS — Z860100 Personal history of colon polyps, unspecified: Secondary | ICD-10-CM | POA: Insufficient documentation

## 2014-01-20 DIAGNOSIS — F3289 Other specified depressive episodes: Secondary | ICD-10-CM | POA: Insufficient documentation

## 2014-01-20 DIAGNOSIS — F329 Major depressive disorder, single episode, unspecified: Secondary | ICD-10-CM | POA: Insufficient documentation

## 2014-01-20 DIAGNOSIS — E039 Hypothyroidism, unspecified: Secondary | ICD-10-CM | POA: Insufficient documentation

## 2014-01-20 DIAGNOSIS — I509 Heart failure, unspecified: Secondary | ICD-10-CM | POA: Diagnosis not present

## 2014-01-20 DIAGNOSIS — I10 Essential (primary) hypertension: Secondary | ICD-10-CM | POA: Diagnosis not present

## 2014-01-20 DIAGNOSIS — I428 Other cardiomyopathies: Secondary | ICD-10-CM | POA: Insufficient documentation

## 2014-01-20 DIAGNOSIS — Z87891 Personal history of nicotine dependence: Secondary | ICD-10-CM | POA: Diagnosis not present

## 2014-01-20 DIAGNOSIS — Z9221 Personal history of antineoplastic chemotherapy: Secondary | ICD-10-CM | POA: Insufficient documentation

## 2014-01-20 DIAGNOSIS — K648 Other hemorrhoids: Secondary | ICD-10-CM | POA: Diagnosis not present

## 2014-01-20 DIAGNOSIS — K5730 Diverticulosis of large intestine without perforation or abscess without bleeding: Secondary | ICD-10-CM | POA: Diagnosis not present

## 2014-01-20 DIAGNOSIS — M109 Gout, unspecified: Secondary | ICD-10-CM | POA: Insufficient documentation

## 2014-01-20 DIAGNOSIS — Z923 Personal history of irradiation: Secondary | ICD-10-CM | POA: Insufficient documentation

## 2014-01-20 HISTORY — PX: ESOPHAGOGASTRODUODENOSCOPY (EGD) WITH PROPOFOL: SHX5813

## 2014-01-20 HISTORY — PX: COLONOSCOPY WITH PROPOFOL: SHX5780

## 2014-01-20 SURGERY — ESOPHAGOGASTRODUODENOSCOPY (EGD) WITH PROPOFOL
Anesthesia: General

## 2014-01-20 MED ORDER — LIDOCAINE VISCOUS 2 % MT SOLN
OROMUCOSAL | Status: AC
  Filled 2014-01-20: qty 15

## 2014-01-20 MED ORDER — PROPOFOL 10 MG/ML IV BOLUS
INTRAVENOUS | Status: AC
  Filled 2014-01-20: qty 20

## 2014-01-20 MED ORDER — LIDOCAINE HCL (CARDIAC) 20 MG/ML IV SOLN
INTRAVENOUS | Status: DC | PRN
  Administered 2014-01-20: 75 mg via INTRAVENOUS

## 2014-01-20 MED ORDER — SODIUM CHLORIDE 0.9 % IV SOLN: INTRAVENOUS | Status: DC

## 2014-01-20 MED ORDER — PROPOFOL 10 MG/ML IV BOLUS
INTRAVENOUS | Status: AC
  Filled 2014-01-20: qty 60

## 2014-01-20 MED ORDER — PROPOFOL INFUSION 10 MG/ML OPTIME
INTRAVENOUS | Status: DC | PRN
  Administered 2014-01-20: 300 ug/kg/min via INTRAVENOUS

## 2014-01-20 MED ORDER — GLYCOPYRROLATE 0.2 MG/ML IJ SOLN
INTRAMUSCULAR | Status: DC | PRN
  Administered 2014-01-20: .2 mg via INTRAVENOUS

## 2014-01-20 MED ORDER — LACTATED RINGERS IV SOLN
INTRAVENOUS | Status: DC
  Administered 2014-01-20: 14:00:00 via INTRAVENOUS

## 2014-01-20 MED ORDER — LABETALOL HCL 5 MG/ML IV SOLN
INTRAVENOUS | Status: DC | PRN
  Administered 2014-01-20: 5 mg via INTRAVENOUS

## 2014-01-20 SURGICAL SUPPLY — 24 items

## 2014-01-20 NOTE — Anesthesia Preprocedure Evaluation (Addendum)
Anesthesia Evaluation  Patient identified by MRN, date of birth, ID band Patient awake    Reviewed: Allergy & Precautions, H&P , NPO status , reviewed documented beta blocker date and time   Airway Mallampati: II TM Distance: >3 FB     Dental  (+) Teeth Intact, Dental Advisory Given   Pulmonary former smoker,  breath sounds clear to auscultation        Cardiovascular hypertension, Pt. on medications Rhythm:Irregular  EF 2012 by ECHO 40 -45%, Stress test 2015 reported normal.  Hx of frequent PVC   Neuro/Psych    GI/Hepatic   Endo/Other    Renal/GU      Musculoskeletal   Abdominal   Peds  Hematology   Anesthesia Other Findings Bigeminy on EKG.  Asymptomatic, has hx of PVC, stress test 2015 reported normal, no pain or discomfort.  Will procede   Reproductive/Obstetrics                          Anesthesia Physical Anesthesia Plan  ASA: III  Anesthesia Plan: General   Post-op Pain Management:    Induction: Intravenous  Airway Management Planned: Nasal Cannula  Additional Equipment:   Intra-op Plan:   Post-operative Plan:   Informed Consent: I have reviewed the patients History and Physical, chart, labs and discussed the procedure including the risks, benefits and alternatives for the proposed anesthesia with the patient or authorized representative who has indicated his/her understanding and acceptance.     Plan Discussed with:   Anesthesia Plan Comments:         Anesthesia Quick Evaluation

## 2014-01-20 NOTE — Transfer of Care (Signed)
Immediate Anesthesia Transfer of Care Note  Patient: Sylvia Moore  Procedure(s) Performed: Procedure(s): ESOPHAGOGASTRODUODENOSCOPY (EGD) WITH PROPOFOL (N/A) COLONOSCOPY WITH PROPOFOL (N/A)  Patient Location: PACU  Anesthesia Type:MAC  Level of Consciousness: awake, alert , oriented and patient cooperative  Airway & Oxygen Therapy: Pt connected to nasal cannula Post-op Assessment: Report given to PACU RN, Post -op Vital signs reviewed and stable and Patient moving all extremities X 4  Post vital signs: stable  Complications: No apparent anesthesia complications

## 2014-01-20 NOTE — Discharge Instructions (Signed)
Esophagogastroduodenoscopy °Care After °Refer to this sheet in the next few weeks. These instructions provide you with information on caring for yourself after your procedure. Your caregiver may also give you more specific instructions. Your treatment has been planned according to current medical practices, but problems sometimes occur. Call your caregiver if you have any problems or questions after your procedure.  °HOME CARE INSTRUCTIONS °· Do not eat or drink anything until the numbing medicine (local anesthetic) has worn off and your gag reflex has returned. You will know that the local anesthetic has worn off when you can swallow comfortably. °· Do not drive for 12 hours after the procedure or as directed by your caregiver. °· Only take medicines as directed by your caregiver. °SEEK MEDICAL CARE IF:  °· You cannot stop coughing. °· You are not urinating at all or less than usual. °SEEK IMMEDIATE MEDICAL CARE IF: °· You have difficulty swallowing. °· You cannot eat or drink. °· You have worsening throat or chest pain. °· You have dizziness, lightheadedness, or you faint. °· You have nausea or vomiting. °· You have chills. °· You have a fever. °· You have severe abdominal pain. °· You have black, tarry, or bloody stools. °Document Released: 04/04/2012 Document Reviewed: 04/04/2012 °ExitCare® Patient Information ©2015 [employer]. This information is not intended to replace advice given to you by your health care provider. Make sure you discuss any questions you have with your health care provider. °Colonoscopy, Care After °These instructions give you information on caring for yourself after your procedure. Your doctor may also give you more specific instructions. Call your doctor if you have any problems or questions after your procedure. °HOME CARE °· Do not drive for 24 hours. °· Do not sign important papers or use machinery for 24 hours. °· You may shower. °· You may go back to your usual activities, but go  slower for the first 24 hours. °· Take rest breaks often during the first 24 hours. °· Walk around or use warm packs on your belly (abdomen) if you have belly cramping or gas. °· Drink enough fluids to keep your pee (urine) clear or pale yellow. °· Resume your normal diet. Avoid heavy or fried foods. °· Avoid drinking alcohol for 24 hours or as told by your doctor. °· Only take medicines as told by your doctor. °If a tissue sample (biopsy) was taken during the procedure:  °· Do not take aspirin or blood thinners for 7 days, or as told by your doctor. °· Do not drink alcohol for 7 days, or as told by your doctor. °· Eat soft foods for the first 24 hours. °GET HELP IF: °You still have a small amount of blood in your poop (stool) 2-3 days after the procedure. °GET HELP RIGHT AWAY IF: °· You have more than a small amount of blood in your poop. °· You see clumps of tissue (blood clots) in your poop. °· Your belly is puffy (swollen). °· You feel sick to your stomach (nauseous) or throw up (vomit). °· You have a fever. °· You have belly pain that gets worse and medicine does not help. °MAKE SURE YOU: °· Understand these instructions. °· Will watch your condition. °· Will get help right away if you are not doing well or get worse. °Document Released: 05/21/2010 Document Revised: 04/23/2013 Document Reviewed: 12/24/2012 °ExitCare® Patient Information ©2015 [employer]. This information is not intended to replace advice given to you by your health care provider. Make sure you   discuss any questions you have with your health care provider. Monitored Anesthesia Care Monitored anesthesia care is an anesthesia service for a medical procedure. Anesthesia is the loss of the ability to feel pain. It is produced by medicines called anesthetics. It may affect a small area of your body (local anesthesia), a large area of your body (regional anesthesia), or your entire body (general anesthesia). The need for monitored anesthesia care  depends your procedure, your condition, and the potential need for regional or general anesthesia. It is often provided during procedures where:   General anesthesia may be needed if there are complications. This is because you need special care when you are under general anesthesia.   You will be under local or regional anesthesia. This is so that you are able to have higher levels of anesthesia if needed.   You will receive calming medicines (sedatives). This is especially the case if sedatives are given to put you in a semi-conscious state of relaxation (deep sedation). This is because the amount of sedative needed to produce this state can be hard to predict. Too much of a sedative can produce general anesthesia. Monitored anesthesia care is performed by one or more health care providers who have special training in all types of anesthesia. You will need to meet with these health care providers before your procedure. During this meeting, they will ask you about your medical history. They will also give you instructions to follow. (For example, you will need to stop eating and drinking before your procedure. You may also need to stop or change medicines you are taking.) During your procedure, your health care providers will stay with you. They will:   Watch your condition. This includes watching your blood pressure, breathing, and level of pain.   Diagnose and treat problems that occur.   Give medicines if they are needed. These may include calming medicines (sedatives) and anesthetics.   Make sure you are comfortable.  Having monitored anesthesia care does not necessarily mean that you will be under anesthesia. It does mean that your health care providers will be able to manage anesthesia if you need it or if it occurs. It also means that you will be able to have a different type of anesthesia than you are having if you need it. When your procedure is complete, your health care providers  will continue to watch your condition. They will make sure any medicines wear off before you are allowed to go home.  Document Released: 01/12/2005 Document Revised: 09/02/2013 Document Reviewed: 05/30/2012 [facility] Patient Information 2015 [place], [place]. This information is not intended to replace advice given to you by your health care provider. Make sure you discuss any questions you have with your health care provider.

## 2014-01-20 NOTE — Anesthesia Postprocedure Evaluation (Signed)
  Anesthesia Post-op Note  Patient: Sylvia Moore  Procedure(s) Performed: Procedure(s): ESOPHAGOGASTRODUODENOSCOPY (EGD) WITH PROPOFOL (N/A) COLONOSCOPY WITH PROPOFOL (N/A)  Patient Location: PACU  Anesthesia Type:General  Level of Consciousness: awake  Airway and Oxygen Therapy: Patient Spontanous Breathing  Post-op Pain: mild  Post-op Assessment: Post-op Vital signs reviewed, Patient's Cardiovascular Status Stable, Respiratory Function Stable and No signs of Nausea or vomiting  Post-op Vital Signs: Reviewed and stable  Last Vitals:  Filed Vitals:   01/20/14 1528  BP:   Pulse: 104  Temp:   Resp: 19    Complications: No apparent anesthesia complications

## 2014-01-20 NOTE — Op Note (Addendum)
[facility] [place], [address]   OPERATIVE PROCEDURE REPORT  PATIENT :Sylvia, Moore  MR#: [id number] BIRTHDATE :11-27-1950 GENDER: female ENDOSCOPIST: Edmonia James, MD ASSISTANT:   Elna Breslow, RN Corliss Parish, technician PROCEDURE DATE: 2014/02/18 PRE-PROCEDURE PREPERATION: Patient fasted for 4 hours prior to procedure. PRE-PROCEDURE PHYSICAL: Patient has stable vital signs.  Neck is supple.  There is no JVD, thyromegaly or LAD.  Chest clear to auscultation.  S1 and S2 regular.  Abdomen soft, non-distended, non-tender with NABS. PROCEDURE:     EGD, diagnostic ASA CLASS:     Class IV INDICATIONS:     GERD. MEDICATIONS:     Per Anesthesia TOPICAL ANESTHETIC:   Viscous Xylocaine  DESCRIPTION OF PROCEDURE:   After the risks benefits and alternatives of the procedure were thoroughly explained, informed consent was obtained.  The PENTAX GASTOROSCOPE [id number]  was introduced through the mouth and advanced to the second portion of the duodenum , without limitations. The instrument was slowly withdrawn as the mucosa was fully examined.   The esophagus, stomach and the proximal small bowel appeared normal. There were no ulcers, erosions, masses or polyps noted. Retroflexed views revealed no abnormalities. The scope was then withdrawn from the patient and the procedure terminated. The patient tolerated the procedure without immediate complications.  IMPRESSION:  Normal esophagogastroduodenoscopy.  RECOMMENDATIONS:     1.  Anti-reflux regimen to be followed. 2.  Continue current medications 3.  Continue PPI's. 4.  OP follow-up is advised on a PRN basis.  REPEAT EXAM:  None planned for now.  DISCHARGE INSTRUCTIONS: standard discharge instructions given.  _______________________________ eSignedEdmonia James, MD February 18, 2014 4:05 PM Revised: Feb 18, 2014 4:05 PM  CPT CODES:     93716, EGD  DIAGNOSIS CODES:     530.81 GERD   CC: Seward Carol, M.D.  PATIENT NAME:  Sylvia Moore, Sylvia Moore MR#: [id number]

## 2014-01-20 NOTE — Op Note (Signed)
[facility] [place], [address]   OPERATIVE PROCEDURE REPORT  PATIENT: Sylvia Moore, Sylvia Moore  MR#: [id number] BIRTHDATE: [birth year] GENDER: female ENDOSCOPIST: Edmonia James, MD ASSISTANT:   Elna Breslow, RN Corliss Parish, technician PROCEDURE DATE: 01/20/2014 PRE-PROCEDURE PREPARATION: The patient was prepped with 2 dulcolax tablets, one ten-ounce bottle of magnesium citrate, and a gallon of Golytely the night prior to the procedure.  The patient was fasted for 8 hours prior to the procedure. PRE-PROCEDURE PHYSICAL: Patient has stable vital signs.  Neck is supple.  There is no JVD, thyromegaly or LAD.  Chest clear to auscultation.  S1 and S2 regular.  Abdomen soft, non-distended, non-tender with NABS. PROCEDURE:     Colonoscopy with cold biopsies x 2. ASA CLASS:     Class IV INDICATIONS:     1. Colorectal cancer screening 2. Rectal bleeding 3. Personal history of adenomatous polyps. MEDICATIONS:     Per Anesthesia  DESCRIPTION OF PROCEDURE:   After the risks, benefits, and alternatives of the procedure were thoroughly explained [including a 10% missed rate of cancer and polyps], informed consent was obtained.  Digital rectal exam was performed.  The EC-3890Li ([id number])  was introduced through the anus  and advanced to the terminal ileum which was intubated for a short distance , limited by No adverse events experienced.   The quality of the prep was fair. . Multiple washes were done. Small lesions could be missed. The instrument was then slowly withdrawn as the colon was fully examined.     COLON FINDINGS: There was mild diverticulosis noted in the sigmoid colon. Two sessile polyps were found in the proximal descending colon  These were removed by cold biopsies x 2 The entire colonic mucosa appeared healthy with a normal vascular pattern.  No masses or AVMs were noted.  The appendiceal orifice and the ICV were identified and photographed.   The terminal ileum appeared normal. Retroflexed views revealed small internal hemorrhoids. The patient tolerated the procedure without immediate complications.  The scope was then withdrawn from the patient and the procedure terminated.  TIME TO CECUM:   8 minutes 0 seconds WITHDRAW TIME:  9 minutes 0 seconds  IMPRESSION:     1.  Mild diverticulosis was noted in the sigmoid colon. 2.  Two sessile polyps were found in the proximal descending colon; these were removed by cold biopsies x 2. 3.  Small internal hemorrhoids.  RECOMMENDATIONS:     1.  Await pathology results. 2.  Hold all NSAIDS for 2 weeks. 3.  Continue current medications. 4.  Continue surveillance 5.  High fiber diet with liberal fluid intake. 6.  OP follow-up is advised on a PRN basis.  REPEAT EXAM:      In 5 years  for a repeat colonoscopy.  If the patient has any abnormal GI symptoms in the interim, she have been advised to contact the office as soon as possible for further recommendations.   CPT CODES:     [id number], Colonoscopy with biopsies  DIAGNOSIS CODES:     V76.51 Colorectal cancer screening 569.3 Rectal bleeding 211.3 Polyps 562.10 Diverticula      REFERRED VH:QIONGE Polite, M.D.  eSigned:  Edmonia James, MD 01/20/2014 4:07 PM   PATIENT NAME:  Sylvia Moore, Sylvia Moore MR#: [id number]

## 2014-01-20 NOTE — H&P (Signed)
Sylvia Moore is an 63 y.o. female.   Chief Complaint: GERD/Colorectal cancer screening. HPI: 63 year old female, here for an EGD/Colonoscopy. See office notes for further details.   Past Medical History  Diagnosis Date  . CHF (congestive heart failure)   . Depression   . Hypothyroidism   . Gout   . HOH (hard of hearing)     has hearing aids  . Headache(784.0) 07/2012    otc med prn = none in the last 8 yrs   . Anemia     history only  . History of blood transfusion 1977    at [facility] - unsure of number of units  . Orthostatic hypotension   . Schwannoma of spinal cord     T3-T4  . Iatrogenic hypotension     orthostatic  . NICM (nonischemic cardiomyopathy)   . Hypertension     jay varanasi   cardiomypoathy  . Pneumonia 08/09/2012    on abx x 5 days  . Hodgkin disease     had rad' and chemo  in 1975-1977   Past Surgical History  Procedure Laterality Date  . Hysteroscopy w/d&c    . [name]      has earing aids  . Colonoscopy w/ polypectomy    . Lumbar fusion  07/2011    T11-L1 posterior lateral  . Dilation and curettage of uterus    . Abdominal exploration surgery  1975    r/t hodgkins disease - had chemo and rad  . Left knee surgery    . Colposcopy N/A 08/14/2012    Procedure: COLPOSCOPY;  Surgeon: [name], MD;  Location: [facility];  Service: Gynecology;  Laterality: N/A;  Colpo of Vulva and Vagina  . Lesion removal N/A 08/14/2012    Procedure: EXCISION VAGINAL LESION;  Surgeon: [name], MD;  Location: [facility];  Service: Gynecology;  Laterality: N/A;  Wide Local Excision of Right Labia Majori  . Rectal biopsy N/A 08/14/2012    Procedure: BIOPSY RECTAL;  Surgeon: [name], MD;  Location: [facility];  Service: Gynecology;  Laterality: N/A;  peri-anal biopsy  . Cholecystectomy  10 yrs ago   Family History  Problem Relation Age of Onset  . Hypertension Father    Social History:  reports that she quit smoking about 45 years ago. Her smoking use included Cigarettes.  She has a .25 pack-year smoking history. She has never used smokeless tobacco. She reports that she drinks alcohol. She reports that she uses illicit drugs (Marijuana).  Allergies:  Allergies  Allergen Reactions  . Codeine Nausea And Vomiting   Medications Prior to Admission  Medication Sig Dispense Refill  . allopurinol (ZYLOPRIM) 100 MG tablet Take 100 mg by mouth 2 (two) times daily.      . carvedilol (COREG) 6.25 MG tablet Take 6.25 mg by mouth 2 (two) times daily with a meal.      . ibuprofen (ADVIL,MOTRIN) 200 MG tablet Take 400 mg by mouth every 6 (six) hours as needed for mild pain or moderate pain.      [name] levothyroxine (SYNTHROID, LEVOTHROID) 125 MCG tablet Take 125 mcg by mouth daily.      [name] omeprazole (PRILOSEC OTC) 20 MG tablet Take 20 mg by mouth daily.      [name] spironolactone (ALDACTONE) 25 MG tablet Take 25 mg by mouth every morning.      . valsartan (DIOVAN) 80 MG tablet Take 80 mg by mouth at bedtime.      [name]  venlafaxine XR (EFFEXOR-XR) 75 MG 24 hr capsule Take 75 mg by mouth daily with breakfast.       No results found for this or any previous visit (from the past 48 hour(s)). No results found.  Review of Systems  Constitutional: Negative.   HENT: Negative.   Eyes: Negative.   Respiratory: Negative.   Cardiovascular: Negative.   Gastrointestinal: Positive for blood in stool. Negative for heartburn, nausea, vomiting, abdominal pain, diarrhea and constipation.  Genitourinary: Negative.   Musculoskeletal: Negative.   Skin: Negative.   Neurological: Negative.   Endo/Heme/Allergies: Negative.   Psychiatric/Behavioral: Negative.    Blood pressure 156/79, pulse 94, temperature 97.8 F (36.6 C), temperature source Oral, resp. rate 13, height 5\' 6"  (1.676 m), weight 96.616 kg (213 lb), SpO2 100.00%. Physical Exam  Constitutional: She is oriented to person, place, and time. She appears well-developed and well-nourished.  HENT:  Head: Normocephalic and atraumatic.  Eyes:  Conjunctivae and EOM are normal. Pupils are equal, round, and reactive to light.  Neck: Normal range of motion. Neck supple.  Cardiovascular: Normal rate and regular rhythm.   Respiratory: Effort normal and breath sounds normal.  GI: Soft. Bowel sounds are normal.  Musculoskeletal: Normal range of motion.  Neurological: She is alert and oriented to person, place, and time.  Skin: Skin is warm and dry.  Psychiatric: She has a normal mood and affect. Her behavior is normal. Judgment and thought content normal.    Assessment/Plan Colorectal cancer screening/GERD: proceed with an EGD/Colonoscopy at this time.  Raydan Schlabach 01/20/2014, 1:57 PM

## 2014-01-21 ENCOUNTER — Encounter (HOSPITAL_COMMUNITY): Payer: Self-pay | Admitting: Gastroenterology

## 2014-03-03 ENCOUNTER — Encounter (HOSPITAL_COMMUNITY): Payer: Self-pay | Admitting: Gastroenterology

## 2014-04-15 ENCOUNTER — Other Ambulatory Visit: Payer: Self-pay | Admitting: Interventional Cardiology

## 2014-05-27 ENCOUNTER — Other Ambulatory Visit: Payer: Self-pay | Admitting: Interventional Cardiology

## 2014-06-03 ENCOUNTER — Other Ambulatory Visit: Payer: Self-pay

## 2014-06-03 DIAGNOSIS — Z1231 Encounter for screening mammogram for malignant neoplasm of breast: Secondary | ICD-10-CM

## 2014-06-17 ENCOUNTER — Ambulatory Visit
Admission: RE | Admit: 2014-06-17 | Discharge: 2014-06-17 | Disposition: A | Discharge status: Home / Self Care | Payer: BC Managed Care – PPO | Source: Ambulatory Visit | Attending: Internal Medicine | Admitting: Internal Medicine

## 2014-06-17 ENCOUNTER — Other Ambulatory Visit: Payer: Self-pay | Admitting: Internal Medicine

## 2014-06-17 DIAGNOSIS — I878 Other specified disorders of veins: Secondary | ICD-10-CM

## 2014-06-17 DIAGNOSIS — R0600 Dyspnea, unspecified: Secondary | ICD-10-CM

## 2014-06-17 MED ORDER — IOHEXOL 350 MG/ML SOLN
100.0000 mL | Freq: Once | INTRAVENOUS | Status: AC | PRN
  Administered 2014-06-17: 100 mL via INTRAVENOUS

## 2014-06-18 ENCOUNTER — Inpatient Hospital Stay (HOSPITAL_COMMUNITY)
Admission: EM | Admit: 2014-06-18 | Discharge: 2014-06-21 | DRG: 292 | Disposition: A | Discharge status: Home / Self Care | Payer: BC Managed Care – PPO | Attending: Cardiology | Admitting: Cardiology

## 2014-06-18 ENCOUNTER — Encounter (HOSPITAL_COMMUNITY): Payer: Self-pay | Admitting: Family Medicine

## 2014-06-18 ENCOUNTER — Telehealth: Payer: Self-pay | Admitting: Interventional Cardiology

## 2014-06-18 ENCOUNTER — Telehealth: Payer: Self-pay | Admitting: *Deleted

## 2014-06-18 DIAGNOSIS — I5023 Acute on chronic systolic (congestive) heart failure: Secondary | ICD-10-CM | POA: Diagnosis present

## 2014-06-18 DIAGNOSIS — F1290 Cannabis use, unspecified, uncomplicated: Secondary | ICD-10-CM | POA: Diagnosis present

## 2014-06-18 DIAGNOSIS — Z9081 Acquired absence of spleen: Secondary | ICD-10-CM | POA: Diagnosis present

## 2014-06-18 DIAGNOSIS — I427 Cardiomyopathy due to drug and external agent: Secondary | ICD-10-CM | POA: Diagnosis present

## 2014-06-18 DIAGNOSIS — I10 Essential (primary) hypertension: Secondary | ICD-10-CM | POA: Diagnosis present

## 2014-06-18 DIAGNOSIS — R0602 Shortness of breath: Secondary | ICD-10-CM | POA: Diagnosis not present

## 2014-06-18 DIAGNOSIS — E039 Hypothyroidism, unspecified: Secondary | ICD-10-CM | POA: Diagnosis present

## 2014-06-18 DIAGNOSIS — Z923 Personal history of irradiation: Secondary | ICD-10-CM | POA: Diagnosis not present

## 2014-06-18 DIAGNOSIS — R079 Chest pain, unspecified: Secondary | ICD-10-CM

## 2014-06-18 DIAGNOSIS — Z9221 Personal history of antineoplastic chemotherapy: Secondary | ICD-10-CM

## 2014-06-18 DIAGNOSIS — D649 Anemia, unspecified: Secondary | ICD-10-CM | POA: Diagnosis present

## 2014-06-18 DIAGNOSIS — H9190 Unspecified hearing loss, unspecified ear: Secondary | ICD-10-CM | POA: Diagnosis present

## 2014-06-18 DIAGNOSIS — N179 Acute kidney failure, unspecified: Secondary | ICD-10-CM | POA: Diagnosis present

## 2014-06-18 DIAGNOSIS — E782 Mixed hyperlipidemia: Secondary | ICD-10-CM | POA: Diagnosis present

## 2014-06-18 DIAGNOSIS — K219 Gastro-esophageal reflux disease without esophagitis: Secondary | ICD-10-CM | POA: Diagnosis present

## 2014-06-18 DIAGNOSIS — Z7982 Long term (current) use of aspirin: Secondary | ICD-10-CM

## 2014-06-18 DIAGNOSIS — F329 Major depressive disorder, single episode, unspecified: Secondary | ICD-10-CM | POA: Diagnosis present

## 2014-06-18 DIAGNOSIS — Z885 Allergy status to narcotic agent status: Secondary | ICD-10-CM | POA: Diagnosis not present

## 2014-06-18 DIAGNOSIS — Z8571 Personal history of Hodgkin lymphoma: Secondary | ICD-10-CM | POA: Diagnosis not present

## 2014-06-18 DIAGNOSIS — I951 Orthostatic hypotension: Secondary | ICD-10-CM | POA: Diagnosis present

## 2014-06-18 DIAGNOSIS — I447 Left bundle-branch block, unspecified: Secondary | ICD-10-CM | POA: Diagnosis present

## 2014-06-18 DIAGNOSIS — M109 Gout, unspecified: Secondary | ICD-10-CM | POA: Diagnosis present

## 2014-06-18 DIAGNOSIS — Z87891 Personal history of nicotine dependence: Secondary | ICD-10-CM

## 2014-06-18 DIAGNOSIS — I428 Other cardiomyopathies: Secondary | ICD-10-CM

## 2014-06-18 DIAGNOSIS — C8190 Hodgkin lymphoma, unspecified, unspecified site: Secondary | ICD-10-CM | POA: Diagnosis present

## 2014-06-18 HISTORY — DX: Hodgkin lymphoma, unspecified, unspecified site: C81.90

## 2014-06-18 HISTORY — DX: Left bundle-branch block, unspecified: I44.7

## 2014-06-18 HISTORY — DX: Gastro-esophageal reflux disease without esophagitis: K21.9

## 2014-06-18 HISTORY — DX: Chronic systolic (congestive) heart failure: I50.22

## 2014-06-18 LAB — APTT: APTT: 36 s (ref 24–37)

## 2014-06-18 LAB — HEPATIC FUNCTION PANEL
ALT: 29 U/L (ref 0–35)
AST: 35 U/L (ref 0–37)
Albumin: 3.9 g/dL (ref 3.5–5.2)
Alkaline Phosphatase: 106 U/L (ref 39–117)
BILIRUBIN DIRECT: 0.3 mg/dL (ref 0.0–0.5)
BILIRUBIN TOTAL: 1.4 mg/dL — AB (ref 0.3–1.2)
Indirect Bilirubin: 1.1 mg/dL — ABNORMAL HIGH (ref 0.3–0.9)
Total Protein: 9 g/dL — ABNORMAL HIGH (ref 6.0–8.3)

## 2014-06-18 LAB — BASIC METABOLIC PANEL
Anion gap: 7 (ref 5–15)
BUN: 18 mg/dL (ref 6–23)
CO2: 23 mmol/L (ref 19–32)
CREATININE: 0.89 mg/dL (ref 0.50–1.10)
Calcium: 9.8 mg/dL (ref 8.4–10.5)
Chloride: 105 mmol/L (ref 96–112)
GFR calc Af Amer: 78 mL/min — ABNORMAL LOW (ref >90)
GFR calc non Af Amer: 68 mL/min — ABNORMAL LOW (ref >90)
Glucose, Bld: 103 mg/dL — ABNORMAL HIGH (ref 70–99)
Potassium: 5 mmol/L (ref 3.5–5.1)
SODIUM: 135 mmol/L (ref 135–145)

## 2014-06-18 LAB — CBC
HEMATOCRIT: 37.3 % (ref 36.0–46.0)
Hemoglobin: 13.2 g/dL (ref 12.0–15.0)
MCH: 35.2 pg — AB (ref 26.0–34.0)
MCHC: 35.4 g/dL (ref 30.0–36.0)
MCV: 99.5 fL (ref 78.0–100.0)
Platelets: 362 10*3/uL (ref 150–400)
RBC: 3.75 MIL/uL — ABNORMAL LOW (ref 3.87–5.11)
RDW: 14 % (ref 11.5–15.5)
WBC: 9.6 10*3/uL (ref 4.0–10.5)

## 2014-06-18 LAB — MAGNESIUM: Magnesium: 1.9 mg/dL (ref 1.5–2.5)

## 2014-06-18 LAB — TROPONIN I
Troponin I: <0.03 ng/mL (ref <0.031)
Troponin I: <0.03 ng/mL (ref <0.031)

## 2014-06-18 LAB — TSH: TSH: 6.101 u[IU]/mL — ABNORMAL HIGH (ref 0.350–4.500)

## 2014-06-18 LAB — PROTIME-INR
INR: 1.15 (ref 0.00–1.49)
Prothrombin Time: 14.8 seconds (ref 11.6–15.2)

## 2014-06-18 LAB — I-STAT TROPONIN, ED: Troponin i, poc: 0.01 ng/mL (ref 0.00–0.08)

## 2014-06-18 LAB — BRAIN NATRIURETIC PEPTIDE: B NATRIURETIC PEPTIDE 5: 432.5 pg/mL — AB (ref 0.0–100.0)

## 2014-06-18 MED ORDER — SODIUM CHLORIDE 0.9 % IV SOLN
INTRAVENOUS | Status: DC
  Administered 2014-06-18: 16:00:00 via INTRAVENOUS

## 2014-06-18 MED ORDER — ASPIRIN 300 MG RE SUPP
300.0000 mg | RECTAL | Status: AC
  Filled 2014-06-18: qty 1

## 2014-06-18 MED ORDER — NITROGLYCERIN IN D5W 200-5 MCG/ML-% IV SOLN
2.0000 ug/min | INTRAVENOUS | Status: DC
  Administered 2014-06-18: 10 ug/min via INTRAVENOUS

## 2014-06-18 MED ORDER — ZOLPIDEM TARTRATE 5 MG PO TABS: 5.0000 mg | ORAL_TABLET | Freq: Every evening | ORAL | Status: DC | PRN

## 2014-06-18 MED ORDER — ALPRAZOLAM 0.25 MG PO TABS: 0.2500 mg | ORAL_TABLET | Freq: Two times a day (BID) | ORAL | Status: DC | PRN

## 2014-06-18 MED ORDER — NITROGLYCERIN IN D5W 200-5 MCG/ML-% IV SOLN
3.0000 ug/min | INTRAVENOUS | Status: DC
  Administered 2014-06-18: 3 ug/min via INTRAVENOUS
  Filled 2014-06-18: qty 250

## 2014-06-18 MED ORDER — PANTOPRAZOLE SODIUM 40 MG PO TBEC
40.0000 mg | DELAYED_RELEASE_TABLET | Freq: Every day | ORAL | Status: DC
  Administered 2014-06-18 – 2014-06-21 (×4): 40 mg via ORAL
  Filled 2014-06-18 (×3): qty 1

## 2014-06-18 MED ORDER — ASPIRIN 81 MG PO CHEW
324.0000 mg | CHEWABLE_TABLET | ORAL | Status: AC
  Administered 2014-06-18: 324 mg via ORAL
  Filled 2014-06-18: qty 4

## 2014-06-18 MED ORDER — ACETAMINOPHEN 325 MG PO TABS
650.0000 mg | ORAL_TABLET | ORAL | Status: DC | PRN
  Administered 2014-06-19 – 2014-06-21 (×5): 650 mg via ORAL
  Filled 2014-06-18 (×5): qty 2

## 2014-06-18 MED ORDER — LEVOTHYROXINE SODIUM 125 MCG PO TABS
125.0000 ug | ORAL_TABLET | Freq: Every day | ORAL | Status: DC
  Administered 2014-06-18 – 2014-06-21 (×4): 125 ug via ORAL
  Filled 2014-06-18 (×5): qty 1

## 2014-06-18 MED ORDER — ALLOPURINOL 100 MG PO TABS
100.0000 mg | ORAL_TABLET | Freq: Two times a day (BID) | ORAL | Status: DC
  Administered 2014-06-18 – 2014-06-21 (×6): 100 mg via ORAL
  Filled 2014-06-18 (×7): qty 1

## 2014-06-18 MED ORDER — ONDANSETRON HCL 4 MG/2ML IJ SOLN: 4.0000 mg | Freq: Four times a day (QID) | INTRAMUSCULAR | Status: DC | PRN

## 2014-06-18 MED ORDER — CARVEDILOL 6.25 MG PO TABS
6.2500 mg | ORAL_TABLET | Freq: Two times a day (BID) | ORAL | Status: DC
  Administered 2014-06-18 – 2014-06-19 (×3): 6.25 mg via ORAL
  Filled 2014-06-18 (×6): qty 1

## 2014-06-18 MED ORDER — VENLAFAXINE HCL ER 37.5 MG PO CP24
37.5000 mg | ORAL_CAPSULE | Freq: Every day | ORAL | Status: DC
  Administered 2014-06-19: 37.5 mg via ORAL
  Filled 2014-06-18 (×3): qty 1

## 2014-06-18 MED ORDER — NITROGLYCERIN 0.4 MG SL SUBL
0.4000 mg | SUBLINGUAL_TABLET | Freq: Once | SUBLINGUAL | Status: AC
  Administered 2014-06-18: 0.4 mg via SUBLINGUAL
  Filled 2014-06-18: qty 1

## 2014-06-18 MED ORDER — FUROSEMIDE 10 MG/ML IJ SOLN
40.0000 mg | Freq: Three times a day (TID) | INTRAMUSCULAR | Status: DC
  Administered 2014-06-18 – 2014-06-19 (×3): 40 mg via INTRAVENOUS
  Filled 2014-06-18 (×5): qty 4

## 2014-06-18 MED ORDER — ASPIRIN EC 81 MG PO TBEC
81.0000 mg | DELAYED_RELEASE_TABLET | Freq: Every day | ORAL | Status: DC
  Administered 2014-06-19 – 2014-06-21 (×3): 81 mg via ORAL
  Filled 2014-06-18 (×3): qty 1

## 2014-06-18 MED ORDER — FUROSEMIDE 10 MG/ML IJ SOLN
40.0000 mg | Freq: Four times a day (QID) | INTRAMUSCULAR | Status: DC
  Administered 2014-06-18: 40 mg via INTRAVENOUS
  Filled 2014-06-18: qty 4

## 2014-06-18 MED ORDER — SPIRONOLACTONE 25 MG PO TABS
25.0000 mg | ORAL_TABLET | Freq: Every day | ORAL | Status: DC
  Administered 2014-06-18 – 2014-06-19 (×2): 25 mg via ORAL
  Filled 2014-06-18 (×2): qty 1

## 2014-06-18 MED ORDER — HEPARIN SODIUM (PORCINE) 5000 UNIT/ML IJ SOLN
5000.0000 [IU] | Freq: Three times a day (TID) | INTRAMUSCULAR | Status: DC
  Administered 2014-06-18 – 2014-06-21 (×9): 5000 [IU] via SUBCUTANEOUS
  Filled 2014-06-18 (×9): qty 1

## 2014-06-18 MED ORDER — IRBESARTAN 75 MG PO TABS
75.0000 mg | ORAL_TABLET | Freq: Every day | ORAL | Status: DC
  Administered 2014-06-18 – 2014-06-19 (×2): 75 mg via ORAL
  Filled 2014-06-18 (×2): qty 1

## 2014-06-18 MED ORDER — ATORVASTATIN CALCIUM 40 MG PO TABS
40.0000 mg | ORAL_TABLET | Freq: Every day | ORAL | Status: DC
  Administered 2014-06-18 – 2014-06-20 (×3): 40 mg via ORAL
  Filled 2014-06-18 (×4): qty 1

## 2014-06-18 MED ORDER — NITROGLYCERIN 0.4 MG SL SUBL: 0.4000 mg | SUBLINGUAL_TABLET | SUBLINGUAL | Status: DC | PRN

## 2014-06-18 NOTE — ED Provider Notes (Signed)
CSN: [id number]     Arrival date & time 06/18/14  4098 History   First MD Initiated Contact with Patient 06/18/14 [phone]     Chief Complaint  Patient presents with  . Chest Pain  . Shortness of Breath     (Consider location/radiation/quality/duration/timing/severity/associated sxs/prior Treatment) HPI Comments: Patient with history of congestive heart failure due to cardiomyopathy, last echo in Epic from 2012 with EF of 40-45% -- presents with increasing shortness of breath as well as chest discomfort. Patient states that she has had decreased energy and increasing dyspnea on exertion since New Year's, however this has been much more pronounced and worse over the past 1 week. She states she has felt this the past with exacerbations of heart failure. Patient notes that she is unable to do basic household chores or walk up a flight of stairs without becoming very short of breath. She is now unable to lie flat at night because of coughing. She denies worsening lower extremity edema.   Patient also has some mid chest pain which she describes as a discomfort. This is been ongoing for the past 1 week as well. It is not made worse with exertion. Patient states that she can feel it slightly more when she takes a deep breath. No hemoptysis. No radiation of the discomfort. No diaphoresis, palpitations, nausea or vomiting.  Patient saw her primary care physician 2 days ago and had a chest x-ray performed. She was then sent for a CT of her chest yesterday to rule out pulmonary embolism. No PE was found. Patient states that she went to her cardiologist office this morning and was referred to the emergency department for further evaluation. She is currently taking spironolactone. No recent changes in her medications or dosages. She denies fever, URI symptoms, cough.  Patient is a 64 y.o. female presenting with chest pain and shortness of breath. The history is provided by the patient and medical records.  Chest  Pain Associated symptoms: shortness of breath   Associated symptoms: no abdominal pain, no back pain, no cough, no diaphoresis, no fever, no nausea, no palpitations and not vomiting   Shortness of Breath Associated symptoms: chest pain   Associated symptoms: no abdominal pain, no cough, no diaphoresis, no fever, no neck pain, no rash and no vomiting     Past Medical History  Diagnosis Date  . CHF (congestive heart failure)   . Depression   . Hypothyroidism   . Gout   . HOH (hard of hearing)     has hearing aids  . Headache(784.0) 07/2012    otc med prn = none in the last 8 yrs   . Anemia     history only  . History of blood transfusion 1977    at [facility] - unsure of number of units  . Orthostatic hypotension   . Schwannoma of spinal cord     T3-T4  . Iatrogenic hypotension     orthostatic  . NICM (nonischemic cardiomyopathy)   . Hypertension     jay varanasi   cardiomypoathy  . Pneumonia 08/09/2012    on abx x 5 days  . Hodgkin disease     had rad' and chemo  in 1975-1977   Past Surgical History  Procedure Laterality Date  . Hysteroscopy w/d&c    . [name]      has earing aids  . Colonoscopy w/ polypectomy    . Lumbar fusion  07/2011    T11-L1 posterior lateral  . Dilation and  curettage of uterus    . Abdominal exploration surgery  1975    r/t hodgkins disease - had chemo and rad  . Left knee surgery    . Colposcopy N/A 08/14/2012    Procedure: COLPOSCOPY;  Surgeon: [name], MD;  Location: [facility];  Service: Gynecology;  Laterality: N/A;  Colpo of Vulva and Vagina  . Lesion removal N/A 08/14/2012    Procedure: EXCISION VAGINAL LESION;  Surgeon: [name], MD;  Location: [facility];  Service: Gynecology;  Laterality: N/A;  Wide Local Excision of Right Labia Majori  . Rectal biopsy N/A 08/14/2012    Procedure: BIOPSY RECTAL;  Surgeon: [name], MD;  Location: [facility];  Service: Gynecology;  Laterality: N/A;  peri-anal biopsy  . Cholecystectomy  10 yrs ago  .  Esophagogastroduodenoscopy (egd) with propofol N/A 01/20/2014    Procedure: ESOPHAGOGASTRODUODENOSCOPY (EGD) WITH PROPOFOL;  Surgeon: [name], MD;  Location: [facility];  Service: Endoscopy;  Laterality: N/A;  . Colonoscopy with propofol N/A 01/20/2014    Procedure: COLONOSCOPY WITH PROPOFOL;  Surgeon: [name], MD;  Location: [facility];  Service: Endoscopy;  Laterality: N/A;   Family History  Problem Relation Age of Onset  . Hypertension Father    History  Substance Use Topics  . Smoking status: Former Smoker -- 0.25 packs/day for 1 years    Types: Cigarettes    Quit date: 05/02/1968  . Smokeless tobacco: Never Used  . Alcohol Use: Yes     Comment: weekends   OB History    Gravida Para Term Preterm AB TAB SAB Ectopic Multiple Living   1 0             Review of Systems  Constitutional: Negative for fever and diaphoresis.  Eyes: Negative for redness.  Respiratory: Positive for shortness of breath. Negative for cough.   Cardiovascular: Positive for chest pain. Negative for palpitations and leg swelling.  Gastrointestinal: Negative for nausea, vomiting and abdominal pain.  Genitourinary: Negative for dysuria.  Musculoskeletal: Negative for back pain and neck pain.  Skin: Negative for rash.  Neurological: Negative for syncope and light-headedness.  Psychiatric/Behavioral: The patient is not nervous/anxious.     Allergies  Codeine  Home Medications   Prior to Admission medications   Medication Sig Start Date End Date Taking? Authorizing Provider  allopurinol (ZYLOPRIM) 100 MG tablet Take 100 mg by mouth 2 (two) times daily.    Historical Provider, MD  carvedilol (COREG) 12.5 MG tablet TAKE 1 TABLET TWICE DAILY WITH FOOD. 04/16/14   [name], MD  ibuprofen (ADVIL,MOTRIN) 200 MG tablet Take 400 mg by mouth every 6 (six) hours as needed for mild pain or moderate pain.    Historical Provider, MD  levothyroxine (SYNTHROID, LEVOTHROID) 125 MCG tablet Take 125  mcg by mouth daily.    Historical Provider, MD  omeprazole (PRILOSEC OTC) 20 MG tablet Take 20 mg by mouth daily.    Historical Provider, MD  spironolactone (ALDACTONE) 25 MG tablet TAKE 1 TABLET ONCE DAILY. 05/28/14   [name], MD  valsartan (DIOVAN) 80 MG tablet Take 80 mg by mouth at bedtime.    Historical Provider, MD  venlafaxine XR (EFFEXOR-XR) 75 MG 24 hr capsule Take 75 mg by mouth daily with breakfast.    Historical Provider, MD   BP 125/75 mmHg  Pulse 95  Temp(Src) 97.6 F (36.4 C) (Oral)  Resp 21  Wt 220 lb (99.791 kg)  SpO2 99%   Physical Exam  Constitutional: She appears well-developed and well-nourished.  HENT:  Head: Normocephalic and atraumatic.  Mouth/Throat: Oropharynx is clear and moist and mucous membranes are normal. Mucous membranes are not dry.  Eyes: Conjunctivae are normal. Right eye exhibits no discharge. Left eye exhibits no discharge.  Neck: Trachea normal and normal range of motion. Neck supple. Normal carotid pulses and no JVD present. No muscular tenderness present. Carotid bruit is not present. No tracheal deviation present.  Cardiovascular: Normal rate, regular rhythm, S1 normal, S2 normal, normal heart sounds and intact distal pulses.  Exam reveals no decreased pulses.   No murmur heard. Pulmonary/Chest: Effort normal and breath sounds normal. No respiratory distress. She has no wheezes. She exhibits no tenderness.  Abdominal: Soft. Normal aorta and bowel sounds are normal. There is no tenderness. There is no rebound and no guarding.  Musculoskeletal: Normal range of motion. She exhibits no edema or tenderness.  Neurological: She is alert.  Skin: Skin is warm and dry. She is not diaphoretic. No cyanosis. No pallor.  Psychiatric: She has a normal mood and affect.  Nursing note and vitals reviewed.   ED Course  Procedures (including critical care time) Labs Review Labs Reviewed  CBC - Abnormal; Notable for the following:    RBC 3.75 (*)     MCH 35.2 (*)    All other components within normal limits  BASIC METABOLIC PANEL - Abnormal; Notable for the following:    Glucose, Bld 103 (*)    GFR calc non Af Amer 68 (*)    GFR calc Af Amer 78 (*)    All other components within normal limits  BRAIN NATRIURETIC PEPTIDE - Abnormal; Notable for the following:    B Natriuretic Peptide 432.5 (*)    All other components within normal limits  HEPATIC FUNCTION PANEL - Abnormal; Notable for the following:    Total Protein 9.0 (*)    Total Bilirubin 1.4 (*)    Indirect Bilirubin 1.1 (*)    All other components within normal limits  MAGNESIUM  TROPONIN I  TSH  T4, FREE  TROPONIN I  TROPONIN I  HEMOGLOBIN A1C  I-STAT TROPOININ, ED    Imaging Review Dg Chest 2 View  06/17/2014   CLINICAL DATA:  There is no evidence of pneumonia nor other acute cardiopulmonary abnormality.  EXAM: CHEST  2 VIEW  COMPARISON:  PA and lateral chest x-ray of July 10, 2013  FINDINGS: The lungs are slightly less well inflated today than on the previous study. There is no focal infiltrate. There is stable linear scarring in the retrosternal region. There is no pleural effusion. The cardiac silhouette is top-normal in size. The pulmonary vascularity is not engorged. The mediastinum is normal in width. There is metallic hardware from lower thoracic and upper lumbar posterior fusion.  IMPRESSION: Borderline enlargement of cardiac silhouette without evidence of pulmonary edema. No active cardiopulmonary disease.   Electronically Signed   By: David  Martinique   On: 06/17/2014 08:41   Ct Angio Chest Pe W/cm &/or Wo Cm  06/17/2014   CLINICAL DATA:  Short of breath. History Hodgkin's lymphoma 1975. Chemo and radiation.  EXAM: CT ANGIOGRAPHY CHEST WITH CONTRAST  TECHNIQUE: Multidetector CT imaging of the chest was performed using the standard protocol during bolus administration of intravenous contrast. Multiplanar CT image reconstructions and MIPs were obtained to evaluate the  vascular anatomy.  CONTRAST:  179mL OMNIPAQUE IOHEXOL 350 MG/ML SOLN  COMPARISON:  CT chest 07/11/2013  FINDINGS: Negative for pulmonary embolism. Mild  cardiac enlargement. Negative for pericardial effusion. Mild atherosclerotic aortic arch without aneurysm. Lack of adequate opacification precludes the ability to evaluate for aortic dissection.  Subpleural mass in the left posterior apex is unchanged from the prior study. This measures approximately 16 x 34 mm and has homogeneous density and does not appear to enhance. This extends toward the T3-4 foramen which is not enlarged. No bony destruction or remodeling is present. This is unchanged from a CT of 08/06/2012 and appears stable. This may represent a nerve sheath tumor of an intercostal nerve or possibly a cyst such is a lateral meningocele. Density of the mass is similar to that of water.  Scarring in the medial apex bilaterally is stable and consistent with prior radiation treatment. Negative for pneumonia or effusion. No adenopathy in the mediastinum.  Review of the MIP images confirms the above findings.  IMPRESSION: Negative for pulmonary embolism.  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 No new findings.   Electronically Signed   By: Franchot Gallo M.D.   On: 06/17/2014 13:22   Ir US Guide Vasc Access Right  06/17/2014   CLINICAL DATA:  64 year old female in need of venous access for CT scan with contrast. Despite for attempts, peripheral IV access could not be obtained. Therefore, ultrasound-guided venous access is required.  EXAM: IR ULTRASOUND GUIDANCE VASC ACCESS RIGHT  Date: 06/17/2014  PROCEDURE: 1. Ultrasound-guided puncture of right basilic vein with IV placement Interventional Radiologist:  Criselda Peaches, MD  ANESTHESIA/SEDATION: None required  MEDICATIONS: None required  TECHNIQUE: Informed consent was obtained from the patient following explanation  of the procedure, risks, benefits and alternatives. The patient understands, agrees and consents for the procedure. All questions were addressed. A time out was performed.  I tourniquet was applied to the right upper arm. The right upper arm was interrogated with ultrasound. The basilic vein is widely patent and compressible. An image was obtained and stored for the medical record. The skin was then cleansed with alcohol swabs. Under real-time sonographic guidance, the vein was punctured with a 21 gauge micropuncture needle. An image was obtained and stored for the medical record. Using standard Seldinger technique, a 5 French micro sheath was advanced over a 0.018 inch wire and into the right basilic vein. The small bore sheath flushed and aspirated with ease. This was secured to the skin and a sterile bandage placed.  COMPLICATIONS: None  IMPRESSION: Successful ultrasound-guided IV start in the right basilic vein.   Electronically Signed   By: Jacqulynn Cadet M.D.   On: 06/17/2014 12:43     EKG Interpretation   Date/Time:  Wednesday June 18 2014 08:37:46 EST Ventricular Rate:  94 PR Interval:  177 QRS Duration: 132 QT Interval:  405 QTC Calculation: 506 R Axis:   -42 Text Interpretation:  Sinus rhythm Left bundle branch block similar to  some previous EKGs Confirmed by Alvino Chapel  MD, Ovid Curd [phone]) on 06/18/2014  2:43:51 PM       9:31 AM Patient seen and examined. Work-up initiated. Medications ordered.   Vital signs reviewed and are as follows: BP 125/75 mmHg  Pulse 95  Temp(Src) 97.6 F (36.4 C) (Oral)  Resp 21  Wt 220 lb (99.791 kg)  SpO2 99%  1:50 PM Cardiology was consulted earlier and will admit patient. Patient has remained stable during ED stay.   BP 124/82 mmHg  Pulse 87  Temp(Src) 97.6 F (  36.4 C) (Oral)  Resp 22  Wt 220 lb (99.791 kg)  SpO2 97%   MDM   Final diagnoses:  Chest pain   Cards admit.     Carlisle Cater, PA-C 06/18/14 [place]  Alvino Chapel, MD 06/19/14 0700

## 2014-06-18 NOTE — Telephone Encounter (Signed)
New Message     Pt c/o Shortness Of Breath: STAT if SOB developed within the last 24 hours or pt is noticeably SOB on the phone  1. Are you currently SOB (can you hear that pt is SOB on the phone)?yes   2. How long have you been experiencing SOB? Off and on for the last couple of weeks getting bad the last couple of days.   3. Are you SOB when sitting or when up moving around? Up moving around   4. Are you currently experiencing any other symptoms? No.

## 2014-06-18 NOTE — ED Notes (Signed)
Cardiology at bedside.

## 2014-06-18 NOTE — ED Notes (Signed)
Chest x ray d/c for now due to pt having CT of chest yesterday. MD contacting Cards to see if they want chest x-ray

## 2014-06-18 NOTE — Progress Notes (Signed)
Chest pain relieved with nitroglycerin infusing

## 2014-06-18 NOTE — Telephone Encounter (Signed)
Pt called the office from the parking lot with complaints of cp and sob.  Pt was instructed to come up to the office for a quick assessment from triage. Triage nursed did a quick thorough assessment on pt and noted pt was pale in color, with complaints of mid-sternal cp and sob.  Pt states that the chest pain goes through to her back and between her shoulder blades.  Pt has not taken any nitroglycerin, for she states "it makes me feel sick." Advised the pt to go over to [facility] ED for full cardiac work-up.  Informed the pt that we will notify our cardmaster Trish to make her aware that pt coming to the ED via private vehicle for full cardiac work-up.  Pts partner is driving her to the ED.  Pt verbalized understanding and agrees with this plan. Cardmaster notified of arrival at 0830.

## 2014-06-18 NOTE — Progress Notes (Signed)
Pt started c/o chest pain, mid-sternum, with pain level of 4.  Ntg already ordered at 49mcg and was stopped for transport from ED.  Restarted.  Pt instructed to call if pain worsens

## 2014-06-18 NOTE — Progress Notes (Signed)
Sylvia Moore admitted to [phone] with diagnosis of chest pain and h/o heart failure.  Denies chest pain at present.  Instructed if chest pain occurs anytime to call and ask for nurse, saying she is having chest pain.  Izora Gala, admissions nurse called.

## 2014-06-18 NOTE — ED Notes (Signed)
CT informed that patient has finished their oral contrast.

## 2014-06-18 NOTE — H&P (Signed)
Sylvia Moore is an 64 y.o. female.    Primary Cardiologist:Dr. Irish Lack  IYM:EBRAXE,NMMHWK D, MD  Chief Complaint: chest pain , SOB HPI: 64 y.o. female who has a nonischemic cardiomyopathy secondary to radiation therapy and chemotherapy. Rest/stress myocardial perfusion scan on 09/04/12 EF was 35%, no ischemia noted.  She also has a history of Hodgkin's lymphoma, status post splenectomy in 1970, history of decompression of T3-T4 spinal schwannoma, hypothyroidism, and orthostatic hypotension.  Last Echo 2012 with EF 40-45% mild MVR, mild AVR, borderline concentric LVH.  Remote cath in 2004  Pt was triaged in our office this AM with complaints of chest pain and SOB.  She was instructed to go to ER for emergent care.  Her initial symptoms began in Jan, [name] and bilateral shoulder pain with walking.  Resolved with rest. The symptoms have progressed to constant pain and SOB.  Now more midsternal chest pressure. Increases with deep breath.   She cannot lie flat to sleep and not sleeping much either.  Has to prop herself up to sleep.  Decreased appetite but despite this 7 pound weight gain.  Minimal if any lower ext edema, though abdomen seems larger to her.   2 VIEW CXR on the 16th of this month without pulmonary edema, borderline enlargement of cardiac silhouette.   She had been seen by Dr. Delfina Redwood for SOB and CT angio was done yesterday.  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 No new findings.   EKG SR with LBBB, old. No acute changes. Trop[onin 0.01 and BNP at 432.  Similar numbers in the past.  NTG took pain from 5 to a 3.    Past Medical History  Diagnosis Date  . CHF (congestive heart failure)   . Depression   . Hypothyroidism   . Gout   . HOH (hard of hearing)     has hearing aids  . Headache(784.0) 07/2012    otc med prn = none in the last 8 yrs   . Anemia    history only  . History of blood transfusion 1977    at [facility] - unsure of number of units  . Orthostatic hypotension   . Schwannoma of spinal cord     T3-T4  . Iatrogenic hypotension     orthostatic  . NICM (nonischemic cardiomyopathy)   . Hypertension     jay varanasi   cardiomypoathy  . Pneumonia 08/09/2012    on abx x 5 days  . Hodgkin disease     had rad' and chemo  in 1975-1977    Past Surgical History  Procedure Laterality Date  . Hysteroscopy w/d&c    . [name]      has earing aids  . Colonoscopy w/ polypectomy    . Lumbar fusion  07/2011    T11-L1 posterior lateral  . Dilation and curettage of uterus    . Abdominal exploration surgery  1975    r/t hodgkins disease - had chemo and rad  . Left knee surgery    . Colposcopy N/A 08/14/2012    Procedure: COLPOSCOPY;  Surgeon: [name], MD;  Location: [facility];  Service: Gynecology;  Laterality: N/A;  Colpo of Vulva and Vagina  . Lesion removal N/A 08/14/2012    Procedure: EXCISION VAGINAL LESION;  Surgeon: [name], MD;  Location: [facility];  Service: Gynecology;  Laterality: N/A;  Wide Local Excision of Right Labia Majori  . Rectal biopsy N/A 08/14/2012    Procedure: BIOPSY RECTAL;  Surgeon: [name], MD;  Location: [facility];  Service: Gynecology;  Laterality: N/A;  peri-anal biopsy  . Cholecystectomy  10 yrs ago  . Esophagogastroduodenoscopy (egd) with propofol N/A 01/20/2014    Procedure: ESOPHAGOGASTRODUODENOSCOPY (EGD) WITH PROPOFOL;  Surgeon: [name], MD;  Location: [facility];  Service: Endoscopy;  Laterality: N/A;  . Colonoscopy with propofol N/A 01/20/2014    Procedure: COLONOSCOPY WITH PROPOFOL;  Surgeon: [name], MD;  Location: [facility];  Service: Endoscopy;  Laterality: N/A;    Family History  Problem Relation Age of Onset  . Hypertension Father   . Stroke Father   . Heart failure Father   . Pneumonia Mother   . Healthy Sister   . Healthy Brother    Social History:  reports that she  quit smoking about 46 years ago. Her smoking use included Cigarettes. She has a .25 pack-year smoking history. She has never used smokeless tobacco. She reports that she drinks alcohol. She reports that she uses illicit drugs (Marijuana). though no more.  Lives with her wife.    Allergies:  Allergies  Allergen Reactions  . Codeine Nausea And Vomiting    OUTPATIENT MEDICATIONS: No current facility-administered medications on file prior to encounter.   Current Outpatient Prescriptions on File Prior to Encounter  Medication Sig Dispense Refill  . allopurinol (ZYLOPRIM) 100 MG tablet Take 100 mg by mouth 2 (two) times daily.    . carvedilol (COREG) 12.5 MG tablet TAKE 1 TABLET TWICE DAILY WITH FOOD. 60 tablet 6  . levothyroxine (SYNTHROID, LEVOTHROID) 125 MCG tablet Take 125 mcg by mouth daily.    [name] omeprazole (PRILOSEC OTC) 20 MG tablet Take 20 mg by mouth daily.    [name] spironolactone (ALDACTONE) 25 MG tablet TAKE 1 TABLET ONCE DAILY. 30 tablet 6  . valsartan (DIOVAN) 80 MG tablet Take 80 mg by mouth at bedtime.    [name] ibuprofen (ADVIL,MOTRIN) 200 MG tablet Take 400 mg by mouth every 6 (six) hours as needed for mild pain or moderate pain.       Results for orders placed or performed during the hospital encounter of 06/18/14 (from the past 48 hour(s))  CBC     Status: Abnormal   Collection Time: 06/18/14  9:00 AM  Result Value Ref Range   WBC 9.6 4.0 - 10.5 K/uL   RBC 3.75 (L) 3.87 - 5.11 MIL/uL   Hemoglobin 13.2 12.0 - 15.0 g/dL   HCT 37.3 36.0 - 46.0 %   MCV 99.5 78.0 - 100.0 fL   MCH 35.2 (H) 26.0 - 34.0 pg   MCHC 35.4 30.0 - 36.0 g/dL   RDW 14.0 11.5 - 15.5 %   Platelets 362 150 - 400 K/uL  Basic metabolic panel     Status: Abnormal   Collection Time: 06/18/14  9:00 AM  Result Value Ref Range   Sodium 135 135 - 145 mmol/L   Potassium 5.0 3.5 - 5.1 mmol/L   Chloride 105 96 - 112 mmol/L   CO2 23 19 - 32 mmol/L   Glucose, Bld 103 (H) 70 - 99 mg/dL   BUN 18 6 - 23 mg/dL    Creatinine, Ser 0.89 0.50 - 1.10 mg/dL   Calcium 9.8 8.4 - 10.5 mg/dL   GFR calc non Af Amer 68 (L) >90 mL/min   GFR calc Af [name]  78 (L) >90 mL/min    Comment: (NOTE) The eGFR has been calculated using the CKD EPI equation. This calculation has not been validated in all clinical situations. eGFR's persistently <90 mL/min signify possible Chronic Kidney Disease.    Anion gap 7 5 - 15  BNP (order ONLY if patient complains of dyspnea/SOB AND you have documented it for THIS visit)     Status: Abnormal   Collection Time: 06/18/14  9:00 AM  Result Value Ref Range   B Natriuretic Peptide 432.5 (H) 0.0 - 100.0 pg/mL  I-stat troponin, ED (not at [facility])     Status: None   Collection Time: 06/18/14  9:05 AM  Result Value Ref Range   Troponin i, poc 0.01 0.00 - 0.08 ng/mL   Comment 3            Comment: Due to the release kinetics of cTnI, a negative result within the first hours of the onset of symptoms does not rule out myocardial infarction with certainty. If myocardial infarction is still suspected, repeat the test at appropriate intervals.    Dg Chest 2 View  06/17/2014   CLINICAL DATA:  There is no evidence of pneumonia nor other acute cardiopulmonary abnormality.  EXAM: CHEST  2 VIEW  COMPARISON:  PA and lateral chest x-ray of July 10, 2013  FINDINGS: The lungs are slightly less well inflated today than on the previous study. There is no focal infiltrate. There is stable linear scarring in the retrosternal region. There is no pleural effusion. The cardiac silhouette is top-normal in size. The pulmonary vascularity is not engorged. The mediastinum is normal in width. There is metallic hardware from lower thoracic and upper lumbar posterior fusion.  IMPRESSION: Borderline enlargement of cardiac silhouette without evidence of pulmonary edema. No active cardiopulmonary disease.   Electronically Signed   By: David  Martinique   On: 06/17/2014 08:41   Ct Angio Chest Pe W/cm &/or Wo Cm  06/17/2014    CLINICAL DATA:  Short of breath. History Hodgkin's lymphoma 1975. Chemo and radiation.  EXAM: CT ANGIOGRAPHY CHEST WITH CONTRAST  TECHNIQUE: Multidetector CT imaging of the chest was performed using the standard protocol during bolus administration of intravenous contrast. Multiplanar CT image reconstructions and MIPs were obtained to evaluate the vascular anatomy.  CONTRAST:  175m OMNIPAQUE IOHEXOL 350 MG/ML SOLN  COMPARISON:  CT chest 07/11/2013  FINDINGS: Negative for pulmonary embolism. Mild cardiac enlargement. Negative for pericardial effusion. Mild atherosclerotic aortic arch without aneurysm. Lack of adequate opacification precludes the ability to evaluate for aortic dissection.  Subpleural mass in the left posterior apex is unchanged from the prior study. This measures approximately 16 x 34 mm and has homogeneous density and does not appear to enhance. This extends toward the T3-4 foramen which is not enlarged. No bony destruction or remodeling is present. This is unchanged from a CT of 08/06/2012 and appears stable. This may represent a nerve sheath tumor of an intercostal nerve or possibly a cyst such is a lateral meningocele. Density of the mass is similar to that of water.  Scarring in the medial apex bilaterally is stable and consistent with prior radiation treatment. Negative for pneumonia or effusion. No adenopathy in the mediastinum.  Review of the MIP images confirms the above findings.  IMPRESSION: Negative for pulmonary embolism.  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 No new findings.   Electronically Signed  By: Franchot Gallo M.D.   On: 06/17/2014 13:22   Ir US Guide Vasc Access Right  06/17/2014   CLINICAL DATA:  64 year old female in need of venous access for CT scan with contrast. Despite for attempts, peripheral IV access could not be obtained. Therefore, ultrasound-guided venous access is  required.  EXAM: IR ULTRASOUND GUIDANCE VASC ACCESS RIGHT  Date: 06/17/2014  PROCEDURE: 1. Ultrasound-guided puncture of right basilic vein with IV placement Interventional Radiologist:  Criselda Peaches, MD  ANESTHESIA/SEDATION: None required  MEDICATIONS: None required  TECHNIQUE: Informed consent was obtained from the patient following explanation of the procedure, risks, benefits and alternatives. The patient understands, agrees and consents for the procedure. All questions were addressed. A time out was performed.  I tourniquet was applied to the right upper arm. The right upper arm was interrogated with ultrasound. The basilic vein is widely patent and compressible. An image was obtained and stored for the medical record. The skin was then cleansed with alcohol swabs. Under real-time sonographic guidance, the vein was punctured with a 21 gauge micropuncture needle. An image was obtained and stored for the medical record. Using standard Seldinger technique, a 5 French micro sheath was advanced over a 0.018 inch wire and into the right basilic vein. The small bore sheath flushed and aspirated with ease. This was secured to the skin and a sterile bandage placed.  COMPLICATIONS: None  IMPRESSION: Successful ultrasound-guided IV start in the right basilic vein.   Electronically Signed   By: Jacqulynn Cadet M.D.   On: 06/17/2014 12:43    ROS: General:no colds or fevers,  weight up 7 lbs Skin:no rashes or ulcers HEENT:no blurred vision, no congestion CV:see HPI PUL:see HPI GI:no diarrhea constipation or melena, no indigestion, decreased appetite  GU:no hematuria, no dysuria MS:no joint pain, no claudication Neuro:no syncope, no lightheadedness Endo:no diabetes, + thyroid disease   Blood pressure 111/75, pulse 89, temperature 97.6 F (36.4 C), temperature source Oral, resp. rate 18, weight 220 lb (99.791 kg), SpO2 99 %. PE: General:Pleasant affect, NAD, sitting up in bed Skin:Warm and dry, brisk  capillary refill HEENT:normocephalic, sclera clear, mucus membranes moist Neck:supple, + JVD at 40 degrees with minimal increase flat., no bruits, no adenopathy  Heart:S1S2 RRR without murmur, gallup, rub or click Lungs:clear without rales, rhonchi, or wheezes JIR:CVEL, non tender, + BS, do not palpate liver spleen or masses Ext:no lower ext edema, 2+ pedal pulses, 2+ radial pulses Neuro:alert and oriented X 3, MAE, follows commands, + facial symmetry    Assessment/Plan Principal Problem:   Chest pain at rest has progressed from with exertion to constant over last several days.  Will try sl NTG to see if any improvement.  May need cardiac cath to eval.  Also repeat echo. Serial troponins, initial troponin is negative.  Neg. PE.  Possible pericarditis.  NTG with improvement.  Add drip.EKG has LBBB. IV heparin.  Active Problems:   SOB (shortness of breath) may be associated with ischemia.   Essential hypertension, benign   Mixed hyperlipidemia- check lipids.     [place] Nurse Practitioner Certified [place] Pager [phone] or after 5pm or weekends call [phone] 06/18/2014, 1:11 PM   Patient seen with NP, agree with the above note.  She has developed prominent exertional dyspnea over the last several weeks as well as weight gain despite no appetite and orthopnea.  She was dyspneic with minimal exertion today so came to the ER.  She has had chest  heaviness x 5 days continuously.  Here, she was noted to have elevated BNP and no PE on chest CT.   1. Acute on chronic systolic CHF: Nonischemic cardiomyopathy, likely from chemotherapy and radiation.  She is clearly volume overloaded on exam.  No definite trigger.   - Echo today.  - May use NTG gtt for decongestion.  - Continue Coreg at 6.25 mg bid, spironolactone 25 mg daily, and home ARB.  - Lasix 40 mg IV every 8 hrs, strict I/Os and daily weights.  2. Chest heaviness: Constant x 3 days, negative troponin.   Suspect this is most likely related to volume overload.  Will cycle cardiac enzymes.   Loralie Champagne 06/18/2014 2:15 PM

## 2014-06-18 NOTE — Telephone Encounter (Signed)
Pt called the office from the parking lot with complaints of cp and sob.  Pt was instructed to come up to the office for a quick assessment from triage. Triage nursed did a quick thorough assessment on pt and noted pt was pale in color, with complaints of mid-sternal cp and sob.  Pt states that the chest pain goes through to her back and between her shoulder blades.  Pt has not taken any nitroglycerin, for she states "it makes me feel sick." Advised the pt to go over to [facility] ED for full cardiac work-up.  Informed the pt that we will notify our cardmaster Trish to make her aware that pt coming to the ED via private vehicle for full cardiac work-up.  Pts partner is driving her to the ED.  Pt verbalized understanding and agrees with this plan. Cardmaster notified of arrival at 0830.

## 2014-06-18 NOTE — Progress Notes (Addendum)
Patient blood pressure this evening is 80/44.  Stopped nitroglycerin drip.  Paged oncall cardiologist.  No further orders.  I will recheck blood pressure in 20 minutes.   Patient blood pressure went up to more normal range with systolic 561.  Will continue to monitor.

## 2014-06-18 NOTE — ED Notes (Signed)
Spoke with cards PA. Would like to try a nitro sl. If pain is relieved, a nitro drip will be started according to PA.

## 2014-06-18 NOTE — ED Notes (Signed)
Pt here for chest pain and SOB over the past week. sts weakness and hx of CHF

## 2014-06-19 ENCOUNTER — Other Ambulatory Visit (HOSPITAL_COMMUNITY): Payer: Self-pay | Admitting: Internal Medicine

## 2014-06-19 ENCOUNTER — Other Ambulatory Visit (HOSPITAL_COMMUNITY): Payer: BC Managed Care – PPO

## 2014-06-19 DIAGNOSIS — R0600 Dyspnea, unspecified: Secondary | ICD-10-CM

## 2014-06-19 DIAGNOSIS — I10 Essential (primary) hypertension: Secondary | ICD-10-CM

## 2014-06-19 DIAGNOSIS — I5021 Acute systolic (congestive) heart failure: Secondary | ICD-10-CM

## 2014-06-19 DIAGNOSIS — R0789 Other chest pain: Secondary | ICD-10-CM

## 2014-06-19 DIAGNOSIS — I501 Left ventricular failure: Secondary | ICD-10-CM

## 2014-06-19 DIAGNOSIS — R079 Chest pain, unspecified: Secondary | ICD-10-CM

## 2014-06-19 LAB — LIPID PANEL
Cholesterol: 164 mg/dL (ref 0–200)
HDL: 42 mg/dL (ref >39)
LDL Cholesterol: 103 mg/dL — ABNORMAL HIGH (ref 0–99)
TRIGLYCERIDES: 97 mg/dL (ref <150)
Total CHOL/HDL Ratio: 3.9 RATIO
VLDL: 19 mg/dL (ref 0–40)

## 2014-06-19 LAB — BASIC METABOLIC PANEL
ANION GAP: 9 (ref 5–15)
BUN: 23 mg/dL (ref 6–23)
CHLORIDE: 103 mmol/L (ref 96–112)
CO2: 25 mmol/L (ref 19–32)
Calcium: 9.6 mg/dL (ref 8.4–10.5)
Creatinine, Ser: 1.49 mg/dL — ABNORMAL HIGH (ref 0.50–1.10)
GFR calc Af Amer: 42 mL/min — ABNORMAL LOW (ref >90)
GFR, EST NON AFRICAN AMERICAN: 36 mL/min — AB (ref >90)
Glucose, Bld: 87 mg/dL (ref 70–99)
POTASSIUM: 3.6 mmol/L (ref 3.5–5.1)
Sodium: 137 mmol/L (ref 135–145)

## 2014-06-19 LAB — HEMOGLOBIN A1C
Hgb A1c MFr Bld: 5.6 % (ref 4.8–5.6)
Mean Plasma Glucose: 114 mg/dL

## 2014-06-19 LAB — CBC
HCT: 34.8 % — ABNORMAL LOW (ref 36.0–46.0)
Hemoglobin: 11.7 g/dL — ABNORMAL LOW (ref 12.0–15.0)
MCH: 33.6 pg (ref 26.0–34.0)
MCHC: 33.6 g/dL (ref 30.0–36.0)
MCV: 100 fL (ref 78.0–100.0)
PLATELETS: 334 10*3/uL (ref 150–400)
RBC: 3.48 MIL/uL — ABNORMAL LOW (ref 3.87–5.11)
RDW: 13.9 % (ref 11.5–15.5)
WBC: 12.3 10*3/uL — ABNORMAL HIGH (ref 4.0–10.5)

## 2014-06-19 LAB — TROPONIN I: Troponin I: <0.03 ng/mL (ref <0.031)

## 2014-06-19 LAB — T4, FREE: Free T4: 1.08 ng/dL (ref 0.80–1.80)

## 2014-06-19 MED ORDER — PERFLUTREN LIPID MICROSPHERE
1.0000 mL | INTRAVENOUS | Status: AC | PRN
  Administered 2014-06-19: 2 mL via INTRAVENOUS
  Filled 2014-06-19: qty 10

## 2014-06-19 NOTE — Progress Notes (Signed)
UR Completed.  [phone]  

## 2014-06-19 NOTE — Care Management Note (Unsigned)
    Page 1 of 1   06/19/2014     4:32:51 PM CARE MANAGEMENT NOTE 06/19/2014  Patient:  Sylvia Moore, Sylvia Moore   Account Number:  [id number]  Date Initiated:  06/19/2014  Documentation initiated by:  Shawnta Zimbelman  Subjective/Objective Assessment:   Pt adm on 06/18/14 with CHF, CP.  PTA, pt resides at home with significant other.     Action/Plan:   Will follow for dc needs as pt progresses.   Anticipated DC Date:  06/21/2014   Anticipated DC Plan:  [place]  CM consult      Choice offered to / List presented to:             Status of service:  In process, will continue to follow Medicare Important Message given?   (If response is "NO", the following Medicare IM given date fields will be blank) Date Medicare IM given:   Medicare IM given by:   Date Additional Medicare IM given:   Additional Medicare IM given by:    Discharge Disposition:    Per UR Regulation:  Reviewed for med. necessity/level of care/duration of stay  If discussed at [place] of Stay Meetings, dates discussed:    Comments:

## 2014-06-19 NOTE — Progress Notes (Signed)
Patient Profile: 64 y.o. female who has a h/o nonischemic cardiomyopathy secondary to radiation therapy and chemotherapy for Hodgkin's lymphoma. Rest/stress myocardial perfusion scan on 09/04/12 demonstrated EF of 35%, no ischemia noted. Admitted 06/18/14 for exertional dyspnea and resting chest pain, felt secondary to acute on chronic systolic CHF. BNP elevated at 432. No PE on CT. Cardiac enzymes negative x 3. Repeat 2D echo pending.   Subjective: Pt notes significant improvement. Breathing improved. Resting chest pain resolved with diuresis. Continues to feel a bit tired and felt slightly dizzy while ambulating earlier this am. She feels that her UOP has slowed today compared to yesterday.   Objective: Vital signs in last 24 hours: Temp:  [98.1 F (36.7 C)-98.6 F (37 C)] 98.6 F (37 C) (02/18 0847) Pulse Rate:  [84-93] 87 (02/18 0847) Resp:  [16-22] 20 (02/18 0847) BP: (79-124)/(37-82) 98/48 mmHg (02/18 0847) SpO2:  [94 %-100 %] 97 % (02/18 0847) Weight:  [213 lb 10 oz (96.9 kg)-215 lb 13.3 oz (97.9 kg)] 213 lb 10 oz (96.9 kg) (02/18 0539) Last BM Date: 06/17/14  Intake/Output from previous day: 02/17 0701 - 02/18 0700 In: 708.1 [P.O.:630; I.V.:78.1] Out: 2250 [Urine:2250] Intake/Output this shift: Total I/O In: 360 [P.O.:360] Out: -   Medications Current Facility-Administered Medications  Medication Dose Route Frequency Provider Last Rate Last Dose  . 0.9 %  sodium chloride infusion   Intravenous Continuous Isaiah Serge, NP   Stopped at 06/18/14 2241  . acetaminophen (TYLENOL) tablet 650 mg  650 mg Oral Q4H PRN Isaiah Serge, NP   650 mg at 06/19/14 9242  . allopurinol (ZYLOPRIM) tablet 100 mg  100 mg Oral BID Isaiah Serge, NP   100 mg at 06/19/14 1024  . ALPRAZolam Duanne Moron) tablet 0.25 mg  0.25 mg Oral BID PRN Isaiah Serge, NP      . aspirin EC tablet 81 mg  81 mg Oral Daily Isaiah Serge, NP   81 mg at 06/19/14 1024  . atorvastatin (LIPITOR) tablet 40 mg  40 mg Oral  q1800 Isaiah Serge, NP   40 mg at 06/18/14 1743  . carvedilol (COREG) tablet 6.25 mg  6.25 mg Oral BID WC Isaiah Serge, NP   6.25 mg at 06/19/14 0555  . furosemide (LASIX) injection 40 mg  40 mg Intravenous 3 times per day Larey Dresser, MD   40 mg at 06/19/14 0556  . heparin injection 5,000 Units  5,000 Units Subcutaneous 3 times per day Isaiah Serge, NP   5,000 Units at 06/19/14 0555  . irbesartan (AVAPRO) tablet 75 mg  75 mg Oral Daily Isaiah Serge, NP   75 mg at 06/19/14 1024  . levothyroxine (SYNTHROID, LEVOTHROID) tablet 125 mcg  125 mcg Oral QAC breakfast Isaiah Serge, NP   125 mcg at 06/19/14 0555  . nitroGLYCERIN (NITROSTAT) SL tablet 0.4 mg  0.4 mg Sublingual Q5 Min x 3 PRN Isaiah Serge, NP      . nitroGLYCERIN 50 mg in dextrose 5 % 250 mL (0.2 mg/mL) infusion  2-200 mcg/min Intravenous Titrated Larey Dresser, MD   Stopped at 06/18/14 2241  . ondansetron (ZOFRAN) injection 4 mg  4 mg Intravenous Q6H PRN Isaiah Serge, NP      . pantoprazole (PROTONIX) EC tablet 40 mg  40 mg Oral Daily Isaiah Serge, NP   40 mg at 06/19/14 1023  . spironolactone (ALDACTONE) tablet 25 mg  25 mg Oral Daily Mickel Baas  Rozell Searing, NP   25 mg at 06/19/14 1024  . venlafaxine XR (EFFEXOR-XR) 24 hr capsule 37.5 mg  37.5 mg Oral Q breakfast Isaiah Serge, NP   37.5 mg at 06/19/14 0555  . zolpidem (AMBIEN) tablet 5 mg  5 mg Oral QHS PRN Isaiah Serge, NP        PE: General appearance: alert, cooperative, no distress and moderately obese Neck: no carotid bruit, no JVD and slight elevated JVD Lungs: clear to auscultation bilaterally Heart: regular rate and rhythm, S1, S2 normal, no murmur, click, rub or gallop Extremities: no LEE Pulses: 2+ and symmetric Skin: warm and dry Neurologic: Grossly normal  Lab Results:   Recent Labs  06/18/14 0900 06/19/14 0057  WBC 9.6 12.3*  HGB 13.2 11.7*  HCT 37.3 34.8*  PLT 362 334   BMET  Recent Labs  06/18/14 0900 06/19/14 0057  NA 135 137  K 5.0  3.6  CL 105 103  CO2 23 25  GLUCOSE 103* 87  BUN 18 23  CREATININE 0.89 1.49*  CALCIUM 9.8 9.6   PT/INR  Recent Labs  06/18/14 1912  LABPROT 14.8  INR 1.15   Cholesterol  Recent Labs  06/19/14 0057  CHOL 164   Cardiac Panel (last 3 results)  Recent Labs  06/18/14 1352 06/18/14 1912 06/19/14 0057  TROPONINI <0.03 <0.03 <0.03   Filed Weights   06/18/14 0839 06/18/14 1541 06/19/14 0539  Weight: 220 lb (99.791 kg) 215 lb 13.3 oz (97.9 kg) 213 lb 10 oz (96.9 kg)    Studies/Results: 2D echo- pending   Assessment/Plan  Principal Problem:   Chest pain at rest Active Problems:   Essential hypertension, benign   Mixed hyperlipidemia   SOB (shortness of breath)   Acute systolic HF (heart failure)   1. Acute on Chronic Systolic CHF: Nonischemic cardiomyopathy, likely from chemotherapy and radiation. Was volume-overloaded on admit. BNP 432. IV Lasix Q8H ordered. Good diuresis in the past 24 hrs. -2.2 L out total. I/Os -1.2 L. Weight is down to 213.  Appears close to euvolemic state. Will back off of diuretic today given AKI. Possible transition to PO in the am. Continue BB. ARB already given today. Hold tomorrow.   2. Chest Pain at Rest: Cardiac enzymes are negative x 3. Symptoms resolved with diuresis. 2D echo pending. If significant change in LVF or if new WMA, will need further ischemic eval   3. AKI: SCr increased from 0.89 to 1.49. BUN from 18 to 23 in the past 24 hrs. Patient notes slowed UOP today compared to yesterday. Likely subsequent to IV lasix therapy in combination with ARB and spiroloactone. Will back off on diuertics. Hold ARB. Recheck BMP in the am.   4. HTN: BP is actually soft but stable in the upper 90s. Monitor closely. Hold Coreg if SBP <90. Will back off of diuretics and ARB short term.  5. HLD: continue statin therapy with Lipitor  6. Hypothyroid:  TSH is elevated at 6.1 but free T4 is WNL at 1.08.  Continue Synthroid    LOS: 1 day     Danai Gotto M. Rosita Fire, PA-C 06/19/2014 11:12 AM

## 2014-06-19 NOTE — Progress Notes (Signed)
  Echocardiogram 2D Echocardiogram has been performed.  Sylvia Moore 06/19/2014, 12:47 PM

## 2014-06-20 ENCOUNTER — Encounter (HOSPITAL_COMMUNITY): Payer: Self-pay | Admitting: *Deleted

## 2014-06-20 ENCOUNTER — Other Ambulatory Visit: Payer: Self-pay

## 2014-06-20 DIAGNOSIS — I429 Cardiomyopathy, unspecified: Secondary | ICD-10-CM

## 2014-06-20 DIAGNOSIS — I447 Left bundle-branch block, unspecified: Secondary | ICD-10-CM | POA: Insufficient documentation

## 2014-06-20 LAB — BASIC METABOLIC PANEL
ANION GAP: 6 (ref 5–15)
BUN: 25 mg/dL — ABNORMAL HIGH (ref 6–23)
CHLORIDE: 101 mmol/L (ref 96–112)
CO2: 29 mmol/L (ref 19–32)
CREATININE: 1.38 mg/dL — AB (ref 0.50–1.10)
Calcium: 9.6 mg/dL (ref 8.4–10.5)
GFR calc non Af Amer: 40 mL/min — ABNORMAL LOW (ref >90)
GFR, EST AFRICAN AMERICAN: 46 mL/min — AB (ref >90)
Glucose, Bld: 91 mg/dL (ref 70–99)
POTASSIUM: 3.9 mmol/L (ref 3.5–5.1)
Sodium: 136 mmol/L (ref 135–145)

## 2014-06-20 MED ORDER — CARVEDILOL 6.25 MG PO TABS
6.2500 mg | ORAL_TABLET | Freq: Two times a day (BID) | ORAL | Status: DC
  Administered 2014-06-20 – 2014-06-21 (×3): 6.25 mg via ORAL
  Filled 2014-06-20 (×5): qty 1

## 2014-06-20 MED ORDER — FUROSEMIDE 20 MG PO TABS
20.0000 mg | ORAL_TABLET | Freq: Every day | ORAL | Status: DC
  Administered 2014-06-20 – 2014-06-21 (×2): 20 mg via ORAL
  Filled 2014-06-20 (×2): qty 1

## 2014-06-20 MED ORDER — SPIRONOLACTONE 12.5 MG HALF TABLET
12.5000 mg | ORAL_TABLET | Freq: Every day | ORAL | Status: DC
  Administered 2014-06-20 – 2014-06-21 (×2): 12.5 mg via ORAL
  Filled 2014-06-20 (×2): qty 1

## 2014-06-20 MED ORDER — VENLAFAXINE HCL ER 37.5 MG PO CP24
37.5000 mg | ORAL_CAPSULE | Freq: Every day | ORAL | Status: DC
  Administered 2014-06-20 – 2014-06-21 (×2): 37.5 mg via ORAL
  Filled 2014-06-20 (×3): qty 1

## 2014-06-20 NOTE — Consult Note (Signed)
ELECTROPHYSIOLOGY CONSULT NOTE    Patient ID: Sylvia Moore MRN: [id number], DOB/AGE: 64/16/1952 64 y.o.  Admit date: 06/18/2014 Date of Consult: 06/20/2014  Primary Physician: Kandice Hams, MD Primary Cardiologist: Irish Lack  Reason for Consultation: non ischemic cardiomyopathy, LBB  HPI:  Sylvia Moore is a 64 y.o. 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  She was admitted on 06/18/14 with heart failure and has been diuresed.  Medication titration is limited by hypotension.  EP has been asked to evaluate for consideration of CRT.   Echocardiogram 06/19/14 demonstrated EF 47-65%, grade 2 diastolic dysfunction, moderate MR, moderate TR, LA 34.  Echo 2012 demonstrated EF 40-45%, Myoview 2014 demonstrated no ischemia and EF 35%.   Lab work this admission is significant for creat 1.49 on admission (1.38 today), negative enzymes, WBC 12.3, Hgb 11.7, TSH 6.101.  She reports significant improvement in chest tightness and shortness of breath with diuresis.  She thinks she is close to baseline.  She did have some recurrent chest tightness this morning but states it was during an emotional time.  No exertional chest discomfort.  She denies palpitations.  She has occasional orthostatic intolerance.  No pre-syncope or syncope.  No recent fevers, chills, nausea, vomiting.  ROS is otherwise negative.   Past Medical History  Diagnosis Date  . Chronic systolic heart failure   . Depression   . Hypothyroidism   . Gout   . HOH (hard of hearing)     has hearing aids  . Orthostatic hypotension   . Schwannoma of spinal cord     T3-T4  . Orthostatic hypotension        . NICM (nonischemic cardiomyopathy)     a. 2/2 radiation and chemotherapy b. echo 2/16  EF 20-25% c. non ischemic myoview 5/14  . Hodgkin's lymphoma     a. s/p rad'  and chemo  in 1975-1977 b. s/p splenectomy 1970  . GERD (gastroesophageal reflux disease)      Surgical History:  Past Surgical History  Procedure Laterality Date  . Hysteroscopy w/d&c    . Colonoscopy w/ polypectomy    . Lumbar fusion  07/2011    T11-L1 posterior lateral  . Dilation and curettage of uterus    . Abdominal exploration surgery  1975    r/t hodgkins disease - had chemo and rad  . Left knee surgery    . Colposcopy N/A 08/14/2012    Procedure: COLPOSCOPY;  Surgeon: [name], MD;  Location: [facility];  Service: Gynecology;  Laterality: N/A;  Colpo of Vulva and Vagina  . Lesion removal N/A 08/14/2012    Procedure: EXCISION VAGINAL LESION;  Surgeon: [name], MD;  Location: [facility];  Service: Gynecology;  Laterality: N/A;  Wide Local Excision of Right Labia Majori  . Rectal biopsy N/A 08/14/2012    Procedure: BIOPSY RECTAL;  Surgeon: [name], MD;  Location: [facility];  Service: Gynecology;  Laterality: N/A;  peri-anal biopsy  . Cholecystectomy  10 yrs ago  . Esophagogastroduodenoscopy (egd) with propofol N/A 01/20/2014    Procedure: ESOPHAGOGASTRODUODENOSCOPY (EGD) WITH PROPOFOL;  Surgeon: [name], MD;  Location: [facility];  Service: Endoscopy;  Laterality: N/A;  . Colonoscopy with propofol N/A 01/20/2014    Procedure: COLONOSCOPY WITH PROPOFOL;  Surgeon: [name], MD;  Location: [facility];  Service: Endoscopy;  Laterality: N/A;  . Knee arthroscopy Right  2015     Prescriptions prior to admission  Medication Sig Dispense Refill Last Dose  . allopurinol (ZYLOPRIM) 100 MG tablet Take 100 mg by mouth 2 (two) times daily.   06/17/2014 at Unknown time  . carvedilol (COREG) 12.5 MG tablet TAKE 1 TABLET TWICE DAILY WITH FOOD. 60 tablet 6 06/17/2014 at 2100  . levothyroxine (SYNTHROID, LEVOTHROID) 125 MCG tablet Take 125 mcg by mouth daily.   06/17/2014 at Unknown time  . Multiple Vitamins-Minerals (MULTIVITAMIN PO) Take 1 tablet by mouth daily.   06/17/2014  at Unknown time  . omeprazole (PRILOSEC OTC) 20 MG tablet Take 20 mg by mouth daily.   06/17/2014 at Unknown time  . spironolactone (ALDACTONE) 25 MG tablet TAKE 1 TABLET ONCE DAILY. 30 tablet 6 06/17/2014 at Unknown time  . valsartan (DIOVAN) 80 MG tablet Take 80 mg by mouth at bedtime.   Past Week at Unknown time  . venlafaxine XR (EFFEXOR-XR) 37.5 MG 24 hr capsule Take 37.5 mg by mouth daily with breakfast.   06/18/2014 at Unknown time  . ibuprofen (ADVIL,MOTRIN) 200 MG tablet Take 400 mg by mouth every 6 (six) hours as needed for mild pain or moderate pain.   Unk    Inpatient Medications:  . allopurinol  100 mg Oral BID  . aspirin EC  81 mg Oral Daily  . atorvastatin  40 mg Oral q1800  . carvedilol  6.25 mg Oral BID WC  . furosemide  20 mg Oral Daily  . heparin  5,000 Units Subcutaneous 3 times per day  . levothyroxine  125 mcg Oral QAC breakfast  . pantoprazole  40 mg Oral Daily  . spironolactone  12.5 mg Oral Daily  . venlafaxine XR  37.5 mg Oral Q breakfast    Allergies:  Allergies  Allergen Reactions  . Codeine Nausea And Vomiting    History   Social History  . Marital Status: Single    Spouse Name: N/A  . Number of Children: N/A  . Years of Education: N/A   Occupational History  . Not on file.   Social History Main Topics  . Smoking status: Former Smoker -- 0.25 packs/day for 1 years    Types: Cigarettes    Quit date: 05/02/1968  . Smokeless tobacco: Never Used  . Alcohol Use: Yes     Comment: weekends  . Drug Use: Yes    Special: Marijuana     Comment: for 8 yrs - quit 28 yrs ago  . Sexual Activity: Not on file   Other Topics Concern  . Not on file   Social History Narrative     Family History  Problem Relation Age of Onset  . Hypertension Father   . Stroke Father   . Heart failure Father   . Pneumonia Mother   . Healthy Sister   . Healthy Brother     BP 100/64 mmHg  Pulse 92  Temp(Src) 98.4 F (36.9 C) (Oral)  Resp 16  Ht 5\' 6"  (1.676 m)   Wt 212 lb 9.6 oz (96.435 kg)  BMI 34.33 kg/m2  SpO2 96%  Physical Exam: Filed Vitals:   06/19/14 2120 06/20/14 0214 06/20/14 0613 06/20/14 0930  BP: 106/59 92/55 93/55  100/64  Pulse: 88 89 88 92  Temp: 98 F (36.7 C)  98 F (36.7 C) 98.4 F (36.9 C)  TempSrc: Oral  Oral Oral  Resp: 17 18 16 16   Height:      Weight:   212 lb 9.6 oz (96.435  kg)   SpO2: 95% 92% 93% 96%    GEN- The patient is well appearing, alert and oriented x 3 today.   Head- normocephalic, atraumatic Eyes-  Sclera clear, conjunctiva pink Ears- hearing intact Oropharynx- clear Neck- supple, Lungs- Clear to ausculation bilaterally, normal work of breathing Heart- Regular rate and rhythm, no murmurs, rubs or gallops, PMI not laterally displaced GI- soft, NT, ND, + BS Extremities- no clubbing, cyanosis, or edema MS- no significant deformity or atrophy Skin- no rash or lesion Psych- euthymic mood, full affect Neuro- strength and sensation are intact     Labs:   Lab Results  Component Value Date   WBC 12.3* 06/19/2014   HGB 11.7* 06/19/2014   HCT 34.8* 06/19/2014   MCV 100.0 06/19/2014   PLT 334 06/19/2014    Recent Labs Lab 06/18/14 1352  06/20/14 0555  NA  --   < > 136  K  --   < > 3.9  CL  --   < > 101  CO2  --   < > 29  BUN  --   < > 25*  CREATININE  --   < > 1.38*  CALCIUM  --   < > 9.6  PROT 9.0*  --   --   BILITOT 1.4*  --   --   ALKPHOS 106  --   --   ALT 29  --   --   AST 35  --   --   GLUCOSE  --   < > 91  < > = values in this interval not displayed.   Radiology/Studies: Dg Chest 2 View 06/17/2014   CLINICAL DATA:  There is no evidence of pneumonia nor other acute cardiopulmonary abnormality.  EXAM: CHEST  2 VIEW  COMPARISON:  PA and lateral chest x-ray of July 10, 2013  FINDINGS: The lungs are slightly less well inflated today than on the previous study. There is no focal infiltrate. There is stable linear scarring in the retrosternal region. There is no pleural effusion. The  cardiac silhouette is top-normal in size. The pulmonary vascularity is not engorged. The mediastinum is normal in width. There is metallic hardware from lower thoracic and upper lumbar posterior fusion.  IMPRESSION: Borderline enlargement of cardiac silhouette without evidence of pulmonary edema. No active cardiopulmonary disease.   Electronically Signed   By: David  Martinique   On: 06/17/2014 08:41   Ct Angio Chest Pe W/cm &/or Wo Cm 06/17/2014   CLINICAL DATA:  Short of breath. History Hodgkin's lymphoma 1975. Chemo and radiation.  EXAM: CT ANGIOGRAPHY CHEST WITH CONTRAST  TECHNIQUE: Multidetector CT imaging of the chest was performed using the standard protocol during bolus administration of intravenous contrast. Multiplanar CT image reconstructions and MIPs were obtained to evaluate the vascular anatomy.  CONTRAST:  172mL OMNIPAQUE IOHEXOL 350 MG/ML SOLN  COMPARISON:  CT chest 07/11/2013  FINDINGS: Negative for pulmonary embolism. Mild cardiac enlargement. Negative for pericardial effusion. Mild atherosclerotic aortic arch without aneurysm. Lack of adequate opacification precludes the ability to evaluate for aortic dissection.  Subpleural mass in the left posterior apex is unchanged from the prior study. This measures approximately 16 x 34 mm and has homogeneous density and does not appear to enhance. This extends toward the T3-4 foramen which is not enlarged. No bony destruction or remodeling is present. This is unchanged from a CT of 08/06/2012 and appears stable. This may represent a nerve sheath tumor of an intercostal nerve or possibly a cyst such  is a lateral meningocele. Density of the mass is similar to that of water.  Scarring in the medial apex bilaterally is stable and consistent with prior radiation treatment. Negative for pneumonia or effusion. No adenopathy in the mediastinum.  Review of the MIP images confirms the above findings.  IMPRESSION: Negative for pulmonary embolism.  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 No new findings.   Electronically Signed   By: Franchot Gallo M.D.   On: 06/17/2014 13:22    OHY:WVPXT rhythm, LBBB, QRS 134  TELEMETRY: sinus rhythm, occasional PVC's  A/P 1. Nonischemic CM (EF 20%), NYHA Class III CHF The patient has a nonischemic CM (EF 20%), NYHA Class III CHF, and LBBB. At this time, she meets SCD-HeFT criteria for ICD implantation for primary prevention of sudden death. Given LBBB QRS >130 msec she also may benefit from CRT (Class IIa indiation).  Risks, benefits, alternatives to BiV ICD implantation were discussed in detail with the patient today. The patient  understands that the risks include but are not limited to bleeding, infection, pneumothorax, perforation, tamponade, vascular damage, renal failure, MI, stroke, death, inappropriate shocks, and lead dislodgement and wishes to proceed.  To reduce risks of infection and other procedural complications, I would advise that she is discharged and return for the procedure rather than proceed this admission. I have therefore scheduled her for the procedure on March 1st as second case.  She will need to come to [facility] short stay for this procedure at 9am. She will continue diuresis while here and will likely be discharged over the weekend.  Electrophysiology team to see as needed while here. Please call with questions.

## 2014-06-20 NOTE — Progress Notes (Signed)
Patient Profile: 64 y.o. female who has a h/o nonischemic cardiomyopathy secondary to radiation therapy and chemotherapy for Hodgkin's lymphoma. Rest/stress myocardial perfusion scan on 09/04/12 demonstrated EF of 35%, no ischemia noted. Admitted 06/18/14 for exertional dyspnea and resting chest pain, felt secondary to acute on chronic systolic CHF. BNP elevated at 432. No PE on CT. Cardiac enzymes negative x 3. Repeat 2D echo demonstrates further reduction in EF to 20-25% with severe diffuse hypokinesis (new compared to 2012).    Subjective: Pt notes significant improvement. Breathing improved. Resting chest pain resolved with diuresis. Ambulated earlier today and did ok but did not a brief episode of atypical chest pain (sharp) while ambulating.   Objective: Vital signs in last 24 hours: Temp:  [97.6 F (36.4 C)-98.3 F (36.8 C)] 98 F (36.7 C) (02/19 8101) Pulse Rate:  [81-89] 88 (02/19 0613) Resp:  [16-18] 16 (02/19 0613) BP: (92-106)/(55-80) 93/55 mmHg (02/19 0613) SpO2:  [92 %-98 %] 93 % (02/19 0613) Weight:  [212 lb 9.6 oz (96.435 kg)] 212 lb 9.6 oz (96.435 kg) (02/19 7510) Last BM Date: 06/19/14  Intake/Output from previous day: 02/18 0701 - 02/19 0700 In: 5 [P.O.:990] Out: 850 [Urine:850] Intake/Output this shift: Total I/O In: 120 [P.O.:120] Out: -   Medications Current Facility-Administered Medications  Medication Dose Route Frequency Provider Last Rate Last Dose  . 0.9 %  sodium chloride infusion   Intravenous Continuous Isaiah Serge, NP   Stopped at 06/18/14 2241  . acetaminophen (TYLENOL) tablet 650 mg  650 mg Oral Q4H PRN Isaiah Serge, NP   650 mg at 06/20/14 0630  . allopurinol (ZYLOPRIM) tablet 100 mg  100 mg Oral BID Isaiah Serge, NP   100 mg at 06/19/14 2037  . ALPRAZolam Duanne Moron) tablet 0.25 mg  0.25 mg Oral BID PRN Isaiah Serge, NP      . aspirin EC tablet 81 mg  81 mg Oral Daily Isaiah Serge, NP   81 mg at 06/19/14 1024  . atorvastatin (LIPITOR)  tablet 40 mg  40 mg Oral q1800 Isaiah Serge, NP   40 mg at 06/19/14 1658  . carvedilol (COREG) tablet 6.25 mg  6.25 mg Oral BID WC Larey Dresser, MD   6.25 mg at 06/20/14 0749  . heparin injection 5,000 Units  5,000 Units Subcutaneous 3 times per day Isaiah Serge, NP   5,000 Units at 06/20/14 0615  . levothyroxine (SYNTHROID, LEVOTHROID) tablet 125 mcg  125 mcg Oral QAC breakfast Isaiah Serge, NP   125 mcg at 06/20/14 0615  . nitroGLYCERIN (NITROSTAT) SL tablet 0.4 mg  0.4 mg Sublingual Q5 Min x 3 PRN Isaiah Serge, NP      . nitroGLYCERIN 50 mg in dextrose 5 % 250 mL (0.2 mg/mL) infusion  2-200 mcg/min Intravenous Titrated Larey Dresser, MD   Stopped at 06/18/14 2241  . ondansetron (ZOFRAN) injection 4 mg  4 mg Intravenous Q6H PRN Isaiah Serge, NP      . pantoprazole (PROTONIX) EC tablet 40 mg  40 mg Oral Daily Isaiah Serge, NP   40 mg at 06/19/14 1023  . venlafaxine XR (EFFEXOR-XR) 24 hr capsule 37.5 mg  37.5 mg Oral Q breakfast Larey Dresser, MD   37.5 mg at 06/20/14 0749  . zolpidem (AMBIEN) tablet 5 mg  5 mg Oral QHS PRN Isaiah Serge, NP        PE: General appearance: alert, cooperative, no distress and moderately  obese Neck: no carotid bruit, no JVD  Lungs: clear to auscultation bilaterally Heart: regular rate and rhythm, S1, S2 normal, no murmur, click, rub or gallop Extremities: no LEE Pulses: 2+ and symmetric Skin: warm and dry Neurologic: Grossly normal  Lab Results:   Recent Labs  06/18/14 0900 06/19/14 0057  WBC 9.6 12.3*  HGB 13.2 11.7*  HCT 37.3 34.8*  PLT 362 334   BMET  Recent Labs  06/18/14 0900 06/19/14 0057 06/20/14 0555  NA 135 137 136  K 5.0 3.6 3.9  CL 105 103 101  CO2 23 25 29   GLUCOSE 103* 87 91  BUN 18 23 25*  CREATININE 0.89 1.49* 1.38*  CALCIUM 9.8 9.6 9.6   PT/INR  Recent Labs  06/18/14 1912  LABPROT 14.8  INR 1.15   Cholesterol  Recent Labs  06/19/14 0057  CHOL 164   Cardiac Panel (last 3  results)  Recent Labs  06/18/14 1352 06/18/14 1912 06/19/14 0057  TROPONINI <0.03 <0.03 <0.03   Filed Weights   06/18/14 1541 06/19/14 0539 06/20/14 0613  Weight: 215 lb 13.3 oz (97.9 kg) 213 lb 10 oz (96.9 kg) 212 lb 9.6 oz (96.435 kg)    Studies/Results: 2D echo 06/19/14 Study Conclusions  - Left ventricle: LV strain is markedly abnormal at -9.2% The cavity size was normal. Systolic function was severely reduced. The estimated ejection fraction was in the range of 20% to 25%. Severe diffuse hypokinesis. There is akinesis of the mid-apicalinferolateral myocardium. Features are consistent with a pseudonormal left ventricular filling pattern, with concomitant abnormal relaxation and increased filling pressure (grade 2 diastolic dysfunction). - Aortic valve: Trileaflet; normal thickness, mildly calcified leaflets. There was mild regurgitation. - Mitral valve: Calcified annulus. There was moderate regurgitation. - Tricuspid valve: There was moderate regurgitation. - Pulmonic valve: There was trivial regurgitation.  Assessment/Plan  Principal Problem:   Chest pain at rest Active Problems:   Essential hypertension, benign   Mixed hyperlipidemia   SOB (shortness of breath)   Acute systolic HF (heart failure)   Chest pain   1. Acute on Chronic Systolic CHF: h/o nonischemic cardiomyopathy, likely from chemotherapy and radiation in the past. Was volume-overloaded on admit. BNP 432. Symptoms improved with diuresis. Weight is down to 212.  Appears close to euvolemic state. 2D echo shows change in EF down to 20-25% w/ new WMA (40-45% in 2012). This is likely progression of disease. Recommend optimization of medical therapy. Continue Coreg. Resume ARB and spironolactone as renal function improves.   2. Chest Pain at Rest: Cardiac enzymes are negative x 3. Symptoms resolved with diuresis. No further CP. 2D shows worsened LVF with EF of 20-25% and new severe diffuse  hypokinesis but this likely is progression of her nonischemic cardiomyopathy.    3. AKI: SCr improved from 1.49 to 1.38. Baseline Scr 0.89. Continue to hold IV diuretics and ARB (volume status stable). If home today, can restart low dose PO Lasix and get f/u BMP early next week. Can restart ARB as an OP.   4. HTN: BP is actually soft but stable in the 90s. Monitor closely. Hold Coreg if SBP <90. Diuretics and ARB on hold  5. HLD: continue statin therapy with Lipitor  6. Hypothyroid:  TSH is elevated at 6.1 but free T4 is WNL at 1.08.  Continue Synthroid    LOS: 2 days    Kripa Foskey M. Rosita Fire, PA-C 06/20/2014 9:01 AM

## 2014-06-21 ENCOUNTER — Encounter (HOSPITAL_COMMUNITY): Payer: Self-pay | Admitting: Physician Assistant

## 2014-06-21 DIAGNOSIS — I447 Left bundle-branch block, unspecified: Secondary | ICD-10-CM | POA: Diagnosis present

## 2014-06-21 DIAGNOSIS — H9190 Unspecified hearing loss, unspecified ear: Secondary | ICD-10-CM

## 2014-06-21 DIAGNOSIS — I5023 Acute on chronic systolic (congestive) heart failure: Secondary | ICD-10-CM

## 2014-06-21 DIAGNOSIS — E039 Hypothyroidism, unspecified: Secondary | ICD-10-CM | POA: Diagnosis present

## 2014-06-21 DIAGNOSIS — K219 Gastro-esophageal reflux disease without esophagitis: Secondary | ICD-10-CM | POA: Diagnosis present

## 2014-06-21 DIAGNOSIS — C8190 Hodgkin lymphoma, unspecified, unspecified site: Secondary | ICD-10-CM | POA: Diagnosis present

## 2014-06-21 DIAGNOSIS — M109 Gout, unspecified: Secondary | ICD-10-CM | POA: Diagnosis present

## 2014-06-21 DIAGNOSIS — I951 Orthostatic hypotension: Secondary | ICD-10-CM | POA: Diagnosis present

## 2014-06-21 LAB — BASIC METABOLIC PANEL
ANION GAP: 7 (ref 5–15)
BUN: 24 mg/dL — ABNORMAL HIGH (ref 6–23)
CHLORIDE: 102 mmol/L (ref 96–112)
CO2: 28 mmol/L (ref 19–32)
Calcium: 9.8 mg/dL (ref 8.4–10.5)
Creatinine, Ser: 1.17 mg/dL — ABNORMAL HIGH (ref 0.50–1.10)
GFR calc Af Amer: 56 mL/min — ABNORMAL LOW (ref >90)
GFR calc non Af Amer: 49 mL/min — ABNORMAL LOW (ref >90)
Glucose, Bld: 94 mg/dL (ref 70–99)
POTASSIUM: 4.4 mmol/L (ref 3.5–5.1)
Sodium: 137 mmol/L (ref 135–145)

## 2014-06-21 MED ORDER — FUROSEMIDE 20 MG PO TABS: 20.0000 mg | ORAL_TABLET | Freq: Every day | ORAL | Status: DC

## 2014-06-21 MED ORDER — SPIRONOLACTONE 25 MG PO TABS: 12.5000 mg | ORAL_TABLET | Freq: Every day | ORAL | Status: DC

## 2014-06-21 MED ORDER — CARVEDILOL 12.5 MG PO TABS: 6.2500 mg | ORAL_TABLET | Freq: Two times a day (BID) | ORAL | Status: DC

## 2014-06-21 NOTE — Progress Notes (Signed)
Patient Name: Sylvia Moore Date of Encounter: 06/21/2014     Principal Problem:   Chest pain at rest Active Problems:   Essential hypertension, benign   Mixed hyperlipidemia   SOB (shortness of breath)   Acute systolic HF (heart failure)   Chest pain   Systolic dysfunction with acute on chronic heart failure   Left bundle branch block    SUBJECTIVE  Sylvia Moore is a 64 y.o. 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 She was admitted on 06/18/14 with heart failure and has been diuresed. She was seen yesterday by Dr. Rayann Heman who has recommended implantation of a bi-V ICD.  This has been scheduled by Dr. Rayann Heman as the second case on March 1. The patient denies any chest pain or shortness of breath.. Telemetry has been stable normal sinus rhythm with occasional PVCs.  EKG shows left bundle branch block.  CURRENT MEDS . allopurinol  100 mg Oral BID  . aspirin EC  81 mg Oral Daily  . atorvastatin  40 mg Oral q1800  . carvedilol  6.25 mg Oral BID WC  . furosemide  20 mg Oral Daily  . heparin  5,000 Units Subcutaneous 3 times per day  . levothyroxine  125 mcg Oral QAC breakfast  . pantoprazole  40 mg Oral Daily  . spironolactone  12.5 mg Oral Daily  . venlafaxine XR  37.5 mg Oral Q breakfast    OBJECTIVE  Filed Vitals:   06/20/14 2223 06/21/14 0729 06/21/14 0744 06/21/14 0946  BP: 101/64 97/54 99/67  117/69  Pulse: 93 83  89  Temp: 98.1 F (36.7 C) 98.1 F (36.7 C)    TempSrc: Oral Oral    Resp: 18 15    Height:      Weight:  212 lb 4.8 oz (96.299 kg)    SpO2: 96% 97% 97%     Intake/Output Summary (Last 24 hours) at 06/21/14 1202 Last data filed at 06/21/14 0950  Gross per 24 hour  Intake    740 ml  Output    875 ml  Net   -135 ml   Filed Weights   06/19/14 0539 06/20/14 0613 06/21/14 0729    Weight: 213 lb 10 oz (96.9 kg) 212 lb 9.6 oz (96.435 kg) 212 lb 4.8 oz (96.299 kg)    PHYSICAL EXAM  General: Pleasant, NAD. Neuro: Alert and oriented X 3. Moves all extremities spontaneously. Psych: Normal affect. HEENT:  Normal  Neck: Supple without bruits or JVD. Lungs:  Resp regular and unlabored, CTA. Heart: RRR no s3, s4, or murmurs. Abdomen: Soft, non-tender, non-distended, BS + x 4.  Extremities: No clubbing, cyanosis or edema. DP/PT/Radials 2+ and equal bilaterally.  Accessory Clinical Findings  CBC  Recent Labs  06/19/14 0057  WBC 12.3*  HGB 11.7*  HCT 34.8*  MCV 100.0  PLT 008   Basic Metabolic Panel  Recent Labs  06/18/14 1352  06/20/14 0555 06/21/14 0448  NA  --   < > 136 137  K  --   < > 3.9 4.4  CL  --   < > 101 102  CO2  --   < > 29 28  GLUCOSE  --   < > 91 94  BUN  --   < > 25* 24*  CREATININE  --   < > 1.38* 1.17*  CALCIUM  --   < >  9.6 9.8  MG 1.9  --   --   --   < > = values in this interval not displayed. Liver Function Tests  Recent Labs  06/18/14 1352  AST 35  ALT 29  ALKPHOS 106  BILITOT 1.4*  PROT 9.0*  ALBUMIN 3.9   No results for input(s): LIPASE, AMYLASE in the last 72 hours. Cardiac Enzymes  Recent Labs  06/18/14 1352 06/18/14 1912 06/19/14 0057  TROPONINI <0.03 <0.03 <0.03   BNP Invalid input(s): POCBNP D-Dimer No results for input(s): DDIMER in the last 72 hours. Hemoglobin A1C  Recent Labs  06/18/14 1352  HGBA1C 5.6   Fasting Lipid Panel  Recent Labs  06/19/14 0057  CHOL 164  HDL 42  LDLCALC 103*  TRIG 97  CHOLHDL 3.9   Thyroid Function Tests  Recent Labs  06/18/14 1352  TSH 6.101*    TELE  Normal sinus rhythm    Radiology/Studies  Dg Chest 2 View  06/17/2014   CLINICAL DATA:  There is no evidence of pneumonia nor other acute cardiopulmonary abnormality.  EXAM: CHEST  2 VIEW  COMPARISON:  PA and lateral chest x-ray of July 10, 2013  FINDINGS: The lungs are slightly less well  inflated today than on the previous study. There is no focal infiltrate. There is stable linear scarring in the retrosternal region. There is no pleural effusion. The cardiac silhouette is top-normal in size. The pulmonary vascularity is not engorged. The mediastinum is normal in width. There is metallic hardware from lower thoracic and upper lumbar posterior fusion.  IMPRESSION: Borderline enlargement of cardiac silhouette without evidence of pulmonary edema. No active cardiopulmonary disease.   Electronically Signed   By: David  Martinique   On: 06/17/2014 08:41   Ct Angio Chest Pe W/cm &/or Wo Cm  06/17/2014   CLINICAL DATA:  Short of breath. History Hodgkin's lymphoma 1975. Chemo and radiation.  EXAM: CT ANGIOGRAPHY CHEST WITH CONTRAST  TECHNIQUE: Multidetector CT imaging of the chest was performed using the standard protocol during bolus administration of intravenous contrast. Multiplanar CT image reconstructions and MIPs were obtained to evaluate the vascular anatomy.  CONTRAST:  167mL OMNIPAQUE IOHEXOL 350 MG/ML SOLN  COMPARISON:  CT chest 07/11/2013  FINDINGS: Negative for pulmonary embolism. Mild cardiac enlargement. Negative for pericardial effusion. Mild atherosclerotic aortic arch without aneurysm. Lack of adequate opacification precludes the ability to evaluate for aortic dissection.  Subpleural mass in the left posterior apex is unchanged from the prior study. This measures approximately 16 x 34 mm and has homogeneous density and does not appear to enhance. This extends toward the T3-4 foramen which is not enlarged. No bony destruction or remodeling is present. This is unchanged from a CT of 08/06/2012 and appears stable. This may represent a nerve sheath tumor of an intercostal nerve or possibly a cyst such is a lateral meningocele. Density of the mass is similar to that of water.  Scarring in the medial apex bilaterally is stable and consistent with prior radiation treatment. Negative for pneumonia or  effusion. No adenopathy in the mediastinum.  Review of the MIP images confirms the above findings.  IMPRESSION: Negative for pulmonary embolism.  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 No new findings.   Electronically Signed   By: Franchot Gallo M.D.   On: 06/17/2014 13:22   Ir US Guide Vasc Access Right  06/17/2014   CLINICAL DATA:  64 year old female in need of venous access for CT scan with contrast. Despite for attempts, peripheral IV access could not be obtained. Therefore, ultrasound-guided venous access is required.  EXAM: IR ULTRASOUND GUIDANCE VASC ACCESS RIGHT  Date: 06/17/2014  PROCEDURE: 1. Ultrasound-guided puncture of right basilic vein with IV placement Interventional Radiologist:  Criselda Peaches, MD  ANESTHESIA/SEDATION: None required  MEDICATIONS: None required  TECHNIQUE: Informed consent was obtained from the patient following explanation of the procedure, risks, benefits and alternatives. The patient understands, agrees and consents for the procedure. All questions were addressed. A time out was performed.  I tourniquet was applied to the right upper arm. The right upper arm was interrogated with ultrasound. The basilic vein is widely patent and compressible. An image was obtained and stored for the medical record. The skin was then cleansed with alcohol swabs. Under real-time sonographic guidance, the vein was punctured with a 21 gauge micropuncture needle. An image was obtained and stored for the medical record. Using standard Seldinger technique, a 5 French micro sheath was advanced over a 0.018 inch wire and into the right basilic vein. The small bore sheath flushed and aspirated with ease. This was secured to the skin and a sterile bandage placed.  COMPLICATIONS: None  IMPRESSION: Successful ultrasound-guided IV start in the right basilic vein.   Electronically Signed   By: Jacqulynn Cadet  M.D.   On: 06/17/2014 12:43    ASSESSMENT AND PLAN  1.  Nonischemic cardiomyopathy with ejection fraction 20% and New York Heart Association class III congestive heart failure. 2.  Left bundle branch block 3.  Remote history of Hodgkin's lymphoma status post splenectomy in 1970 and status post radiation and chemotherapy 4.  Hypothyroidism 5.  Mild bump in creatinine, resolved  Plan: Okay for discharge today.  She will return on March 1 at 9 AM to [facility] short stay for implantation of her Bi-V ICD by Dr. Rayann Heman.  Signed, Darlin Coco MD

## 2014-06-21 NOTE — Discharge Summary (Signed)
Discharge Summary   Patient ID: Sylvia Moore MRN: [id number], DOB/AGE: 1950/08/14 64 y.o. Admit date: 06/18/2014 D/C date:     06/21/2014  Primary Cardiologist: Dr. Irish Moore   Principal Problem:   Acute on chronic systolic CHF (congestive heart failure) Active Problems:   Nonischemic cardiomyopathy   Essential hypertension, benign   Mixed hyperlipidemia   Left bundle branch block   Gout   Hypothyroidism   HOH (hard of hearing)   Orthostatic hypotension   Hodgkin's lymphoma   GERD (gastroesophageal reflux disease)   LBBB (left bundle branch block)    Admission Dates: 06/18/14-06/21/14 Discharge Diagnosis: Acute on chronic systolic CHF/non ischemic CM.  Discharge weight 212lbs.   HPI: Sylvia Moore is a 64 y.o. female with a history of non ischemic cardiomyopathy (2/2 radiation and chemotherapy), LBBB, Hodgkin's lymphoma (s/p spenectomy in 1970), hypothyroidism and orthostatic hypotension who was admitted on 06/18/14 with acute on chronic systolic CHF.     Hospital Course  Acute on chronic systolic CHF- -- BNP elevated at 432. No PE on CT. Cardiac enzymes negative x 3.  -- Net neg 1.7 L. Weight down 8lbs (220--> 212lbs) Discharge weight 212lbs.  -- Continue Coreg at 6.25 mg bid, spironolactone 12.5 mg daily, and valsartan 80mg . Continue Lasix 20 mg po qd.   Nonischemic cardiomyopathy with EF 20% and NHYA class III CHF, LBBB -- 2D ECHO 06/19/14 demonstrated EF 15-17%, grade 2 diastolic dysfunction, moderate MR, moderate TR, LA 34. Echo 2012 demonstrated EF 40-45%, Myoview 2014 demonstrated no ischemia and EF 35%.  -- The patient has a nonischemic CM (EF 20%), NYHA Class III CHF, and LBBB. At this time, she meets SCD-HeFT criteria for ICD implantation for primary prevention of sudden death. Given LBBB QRS >130 msec she also may benefit from CRT (Class IIa indiation). She will return on March 1 at 9 AM to [facility] short stay for implantation of her Bi-V ICD by Dr. Rayann Moore. --  Continue Coreg at 6.25 mg bid, spironolactone 12.5 mg daily, and valsartan 80mg    Left bundle branch block- chronic. Given LBBB QRS >130, CHF and cardiomyopathy she will be a candidate for CRT   Remote history of Hodgkin's lymphoma status post splenectomy in 1970 and status post radiation and chemotherapy  Hypothyroidism- TSH slightly elevated at 6.1 but Free T4 normal at 1.08  Mild bump in creatinine- improved. Creat 1.49--> 1.17  HLD- continue statin therapy with Lipitor  The patient has had an uncomplicated hospital course and is recovering well.  She has been seen by Dr. Mare Moore today and deemed ready for discharge home. All follow-up appointments have been scheduled. Discharge medications are listed below.   Discharge Vitals: Blood pressure 117/69, pulse 89, temperature 98.1 F (36.7 C), temperature source Oral, resp. rate 15, height 5\' 6"  (1.676 m), weight 212 lb 4.8 oz (96.299 kg), SpO2 97 %.  Labs: Lab Results  Component Value Date   WBC 12.3* 06/19/2014   HGB 11.7* 06/19/2014   HCT 34.8* 06/19/2014   MCV 100.0 06/19/2014   PLT 334 06/19/2014    Recent Labs Lab 06/18/14 1352  06/21/14 0448  NA  --   < > 137  K  --   < > 4.4  CL  --   < > 102  CO2  --   < > 28  BUN  --   < > 24*  CREATININE  --   < > 1.17*  CALCIUM  --   < > 9.8  PROT 9.0*  --   --   BILITOT 1.4*  --   --   ALKPHOS 106  --   --   ALT 29  --   --   AST 35  --   --   GLUCOSE  --   < > 94  < > = values in this interval not displayed.  Recent Labs  06/18/14 1352 06/18/14 1912 06/19/14 0057  TROPONINI <0.03 <0.03 <0.03   Lab Results  Component Value Date   CHOL 164 06/19/2014   HDL 42 06/19/2014   LDLCALC 103* 06/19/2014   TRIG 97 06/19/2014   No results found for: DDIMER  Diagnostic Studies/Procedures   Dg Chest 2 View  06/17/2014   CLINICAL DATA:  There is no evidence of pneumonia nor other acute cardiopulmonary abnormality.  EXAM: CHEST  2 VIEW  COMPARISON:  PA and lateral chest  x-ray of July 10, 2013  FINDINGS: The lungs are slightly less well inflated today than on the previous study. There is no focal infiltrate. There is stable linear scarring in the retrosternal region. There is no pleural effusion. The cardiac silhouette is top-normal in size. The pulmonary vascularity is not engorged. The mediastinum is normal in width. There is metallic hardware from lower thoracic and upper lumbar posterior fusion.  IMPRESSION: Borderline enlargement of cardiac silhouette without evidence of pulmonary edema. No active cardiopulmonary disease.   Electronically Signed   By: David  Martinique   On: 06/17/2014 08:41   Ct Angio Chest Pe W/cm &/or Wo Cm  06/17/2014   CLINICAL DATA:  Short of breath. History Hodgkin's lymphoma 1975. Chemo and radiation.  EXAM: CT ANGIOGRAPHY CHEST WITH CONTRAST  TECHNIQUE: Multidetector CT imaging of the chest was performed using the standard protocol during bolus administration of intravenous contrast. Multiplanar CT image reconstructions and MIPs were obtained to evaluate the vascular anatomy.  CONTRAST:  159mL OMNIPAQUE IOHEXOL 350 MG/ML SOLN  COMPARISON:  CT chest 07/11/2013  FINDINGS: Negative for pulmonary embolism. Mild cardiac enlargement. Negative for pericardial effusion. Mild atherosclerotic aortic arch without aneurysm. Moore of adequate opacification precludes the ability to evaluate for aortic dissection.  Subpleural mass in the left posterior apex is unchanged from the prior study. This measures approximately 16 x 34 mm and has homogeneous density and does not appear to enhance. This extends toward the T3-4 foramen which is not enlarged. No bony destruction or remodeling is present. This is unchanged from a CT of 08/06/2012 and appears stable. This may represent a nerve sheath tumor of an intercostal nerve or possibly a cyst such is a lateral meningocele. Density of the mass is similar to that of water.  Scarring in the medial apex bilaterally is stable and  consistent with prior radiation treatment. Negative for pneumonia or effusion. No adenopathy in the mediastinum.  Review of the MIP images confirms the above findings.  IMPRESSION: Negative for pulmonary embolism.  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 No new findings.   Electronically Signed   By: Franchot Gallo M.D.   On: 06/17/2014 13:22   Ir US Guide Vasc Access Right  06/17/2014   CLINICAL DATA:  64 year old female in need of venous access for CT scan with contrast. Despite for attempts, peripheral IV access could not be obtained. Therefore, ultrasound-guided venous access is required.  EXAM: IR ULTRASOUND GUIDANCE VASC ACCESS RIGHT  Date: 06/17/2014  PROCEDURE: 1. Ultrasound-guided puncture  of right basilic vein with IV placement Interventional Radiologist:  Criselda Peaches, MD  ANESTHESIA/SEDATION: None required  MEDICATIONS: None required  TECHNIQUE: Informed consent was obtained from the patient following explanation of the procedure, risks, benefits and alternatives. The patient understands, agrees and consents for the procedure. All questions were addressed. A time out was performed.  I tourniquet was applied to the right upper arm. The right upper arm was interrogated with ultrasound. The basilic vein is widely patent and compressible. An image was obtained and stored for the medical record. The skin was then cleansed with alcohol swabs. Under real-time sonographic guidance, the vein was punctured with a 21 gauge micropuncture needle. An image was obtained and stored for the medical record. Using standard Seldinger technique, a 5 French micro sheath was advanced over a 0.018 inch wire and into the right basilic vein. The small bore sheath flushed and aspirated with ease. This was secured to the skin and a sterile bandage placed.  COMPLICATIONS: None  IMPRESSION: Successful ultrasound-guided IV start in the  right basilic vein.   Electronically Signed   By: Jacqulynn Cadet M.D.   On: 06/17/2014 12:43     2 D ECHO Study Date: 06/19/2014 LV EF: 20% -   25% Study Conclusions - Left ventricle: LV strain is markedly abnormal at -9.2% The   cavity size was normal. Systolic function was severely reduced.   The estimated ejection fraction was in the range of 20% to 25%.   Severe diffuse hypokinesis. There is akinesis of the   mid-apicalinferolateral myocardium. Features are consistent with   a pseudonormal left ventricular filling pattern, with concomitant   abnormal relaxation and increased filling pressure (grade 2   diastolic dysfunction). - Aortic valve: Trileaflet; normal thickness, mildly calcified   leaflets. There was mild regurgitation. - Mitral valve: Calcified annulus. There was moderate   regurgitation. - Tricuspid valve: There was moderate regurgitation. - Pulmonic valve: There was trivial regurgitation.     Discharge Medications     Medication List    STOP taking these medications        ibuprofen 200 MG tablet  Commonly known as:  ADVIL,MOTRIN      TAKE these medications        allopurinol 100 MG tablet  Commonly known as:  ZYLOPRIM  Take 100 mg by mouth 2 (two) times daily.     carvedilol 12.5 MG tablet  Commonly known as:  COREG  Take 0.5 tablets (6.25 mg total) by mouth 2 (two) times daily with a meal.     furosemide 20 MG tablet  Commonly known as:  LASIX  Take 1 tablet (20 mg total) by mouth daily.     levothyroxine 125 MCG tablet  Commonly known as:  SYNTHROID, LEVOTHROID  Take 125 mcg by mouth daily.     MULTIVITAMIN PO  Take 1 tablet by mouth daily.     omeprazole 20 MG tablet  Commonly known as:  PRILOSEC OTC  Take 20 mg by mouth daily.     spironolactone 25 MG tablet  Commonly known as:  ALDACTONE  Take 0.5 tablets (12.5 mg total) by mouth daily.     valsartan 80 MG tablet  Commonly known as:  DIOVAN  Take 80 mg by mouth at bedtime.       venlafaxine XR 37.5 MG 24 hr capsule  Commonly known as:  EFFEXOR-XR  Take 37.5 mg by mouth daily with breakfast.        Disposition  The patient will be discharged in stable condition to home.  Follow-up Information    Follow up with Erlene Quan, PA-C On 07/02/2014.   Specialty:  Cardiology   Why:  9 am   Contact information:   [place] [phone]       Follow up with [facility] On 07/01/2014.   Why:  Please arrive at 9am for your device placement. Nothing to eat before midnight the night before.    Contact information:   [address] [id number] [place] [phone]        Duration of Discharge Encounter: Greater than 30 minutes including physician and PA time.  Audrie Gallus, Donovan Persley R PA-C 06/21/2014, 1:23 PM

## 2014-06-25 ENCOUNTER — Telehealth: Payer: Self-pay | Admitting: Interventional Cardiology

## 2014-06-25 ENCOUNTER — Ambulatory Visit
Admission: RE | Admit: 2014-06-25 | Discharge: 2014-06-25 | Disposition: A | Discharge status: Home / Self Care | Payer: BC Managed Care – PPO | Source: Ambulatory Visit

## 2014-06-25 DIAGNOSIS — Z1231 Encounter for screening mammogram for malignant neoplasm of breast: Secondary | ICD-10-CM

## 2014-06-25 NOTE — Telephone Encounter (Signed)
I dont see that patient had a transition of care follow-up after hospitalization for CHF. Given chest pain ongoing, please schedule an appointment this with in the flex clinic to have her chest pain evaluated prior to BiV ICD implant scheduled for next week.  As an alternative, she could be seen by Dr Irish Lack this week if his schedule allows (though flex clinic may be required depending on his schedule).

## 2014-06-25 NOTE — Telephone Encounter (Signed)
Patient's caretaker, Lelon Frohlich, wanted Dr. Irish Lack and Dr. Rayann Heman to be aware that patient has been experiencing continuous "chest discomfort" (4 on a 10 scale) since her discharge from [facility] last week. Patient is scheduled for implantable defibrillator on March 1st. Patient indicates that "pain" is "not that bad" but just wanted doctors to know about it. Patient denies any other pertinent symptoms. Patient will call back for any worsening or new symptoms. Routed to Dr. Irish Lack and Dr. Rayann Heman.

## 2014-06-25 NOTE — Telephone Encounter (Signed)
New Message  Pt wife called to speak w/ Rn about pt condition- since pt d/c from hosp (2/20) Pt has been feeling chest tightness everyday- 'Not painful' (4 on a pain scale of 1-10). Please call back and discuss.

## 2014-06-26 NOTE — Telephone Encounter (Signed)
Discussed with Dr Rayann Heman and he would like for an extender to asess the patient prior to him implanting  We have left message for her to call to schedule for tomorrow or Monday

## 2014-06-26 NOTE — Telephone Encounter (Signed)
Patient had a negative nuclear stress test in 5/14.  If her sx persist, we would have to consider cath- although I think she had cath by Dr. Leonia Reeves many years ago that was ok and did not require PCI.  Jeneen Rinks, is that something you would want to see before device implantation?

## 2014-06-27 ENCOUNTER — Encounter: Payer: Self-pay | Admitting: Cardiology

## 2014-06-27 ENCOUNTER — Ambulatory Visit (INDEPENDENT_AMBULATORY_CARE_PROVIDER_SITE_OTHER): Payer: BC Managed Care – PPO | Admitting: Cardiology

## 2014-06-27 ENCOUNTER — Telehealth: Payer: Self-pay

## 2014-06-27 VITALS — BP 91/57 | HR 76 | Ht 66.0 in | Wt 210.0 lb

## 2014-06-27 DIAGNOSIS — I10 Essential (primary) hypertension: Secondary | ICD-10-CM

## 2014-06-27 DIAGNOSIS — Z0389 Encounter for observation for other suspected diseases and conditions ruled out: Secondary | ICD-10-CM

## 2014-06-27 DIAGNOSIS — C8190 Hodgkin lymphoma, unspecified, unspecified site: Secondary | ICD-10-CM

## 2014-06-27 DIAGNOSIS — IMO0001 Reserved for inherently not codable concepts without codable children: Secondary | ICD-10-CM

## 2014-06-27 DIAGNOSIS — I5023 Acute on chronic systolic (congestive) heart failure: Secondary | ICD-10-CM

## 2014-06-27 DIAGNOSIS — R0789 Other chest pain: Secondary | ICD-10-CM

## 2014-06-27 DIAGNOSIS — R079 Chest pain, unspecified: Secondary | ICD-10-CM | POA: Insufficient documentation

## 2014-06-27 DIAGNOSIS — I447 Left bundle-branch block, unspecified: Secondary | ICD-10-CM

## 2014-06-27 NOTE — Assessment & Plan Note (Signed)
Chronic. 

## 2014-06-27 NOTE — Assessment & Plan Note (Signed)
Some typical, some atypical symptoms

## 2014-06-27 NOTE — Assessment & Plan Note (Addendum)
B/P low 

## 2014-06-27 NOTE — Telephone Encounter (Signed)
Pt aware Sylvia Moore has spoken with Dr.Varanasi. His recommendation is to proceed with a diagnostic cardiac cath. Adv her that her ICD implantation with Dr.Allred on 3/1 will be rescheduled and her cardiac cath with Dr.V will be scheduled for that day. Adv her that I will call [facility] to scheduled and call her back with a time and instructions. She verbalized understanding.

## 2014-06-27 NOTE — Progress Notes (Signed)
06/27/2014 Sylvia Moore   [birth year]  [id number]  Primary Physician Kandice Hams, MD Primary Cardiologist: Dr Irish Lack  HPI:  64 y.o. female with a history of non ischemic cardiomyopathy (2/2 radiation and chemotherapy), LBBB, Hodgkin's lymphoma (s/p spenectomy in 1970), hypothyroidism and orthostatic hypotension who was admitted on 06/18/14 with acute on chronic systolic CHF. 2D ECHO 06/19/14 demonstrated EF 17-00%, grade 2 diastolic dysfunction, moderate MR, moderate TR, LA 34. Echo 2012 demonstrated EF 40-45%, Myoview 2014 demonstrated no ischemia and EF 35%. She was to have an elective ICD placed next week but the pt's significant other called saying the pt has been having chest pain. She is seen today in the [facility] for further evaluation.             The pt describes mid sternal dull chest pain that only lasts a few seconds. She has vague pain in her back between her shoulders. She denies an exertional component. She denies any associated nausea vomiting or diaphoresis. There does not seem to be any relation to movement or deep breathing.    Current Outpatient Prescriptions  Medication Sig Dispense Refill  . allopurinol (ZYLOPRIM) 100 MG tablet Take 100 mg by mouth 2 (two) times daily.    . carvedilol (COREG) 12.5 MG tablet Take 0.5 tablets (6.25 mg total) by mouth 2 (two) times daily with a meal. 60 tablet 6  . furosemide (LASIX) 20 MG tablet Take 1 tablet (20 mg total) by mouth daily. 30 tablet 11  . levothyroxine (SYNTHROID, LEVOTHROID) 125 MCG tablet Take 125 mcg by mouth daily.    . Multiple Vitamins-Minerals (MULTIVITAMIN PO) Take 1 tablet by mouth daily.    [name] omeprazole (PRILOSEC OTC) 20 MG tablet Take 20 mg by mouth daily.    [name] spironolactone (ALDACTONE) 25 MG tablet Take 0.5 tablets (12.5 mg total) by mouth daily. 30 tablet 11  . valsartan (DIOVAN) 80 MG tablet Take 80 mg by mouth at bedtime.    [name] venlafaxine XR (EFFEXOR-XR) 37.5 MG 24 hr capsule Take 37.5 mg by mouth  daily with breakfast.     No current facility-administered medications for this visit.    Allergies  Allergen Reactions  . Codeine Nausea And Vomiting    History   Social History  . Marital Status: Married    Spouse Name: N/A  . Number of Children: N/A  . Years of Education: N/A   Occupational History  . Not on file.   Social History Main Topics  . Smoking status: Former Smoker -- 0.25 packs/day for 1 years    Types: Cigarettes    Quit date: 05/02/1968  . Smokeless tobacco: Never Used  . Alcohol Use: Yes     Comment: weekends  . Drug Use: Yes    Special: Marijuana     Comment: for 8 yrs - quit 28 yrs ago  . Sexual Activity: Not on file   Other Topics Concern  . Not on file   Social History Narrative     Review of Systems: General: negative for chills, fever, night sweats or weight changes.  Cardiovascular: negative for chest pain, dyspnea on exertion, edema, orthopnea, palpitations, paroxysmal nocturnal dyspnea or shortness of breath Dermatological: negative for rash Respiratory: negative for cough or wheezing Urologic: negative for hematuria Abdominal: negative for nausea, vomiting, diarrhea, bright red blood per rectum, melena, or hematemesis Neurologic: negative for visual changes, syncope, or dizziness All other systems reviewed and are otherwise negative except as noted above.  Blood pressure 91/57, pulse 76, height 5\' 6"  (1.676 m), weight 210 lb (95.255 kg), SpO2 98 %.  General appearance: alert, cooperative and no distress Lungs: clear to auscultation bilaterally Heart: regular rate and rhythm  EKG NSR LBBB  ASSESSMENT AND PLAN:   Chest pain Some typical, some atypical symptoms   Acute on chronic systolic CHF (congestive heart failure) Stable since discharge    Normal coronary arteries 2004 Low risk Myoview May 2014   Hodgkin's lymphoma  s/p rad' and chemo  in 1975-1977 b. s/p splenectomy 1970   Essential hypertension, benign B/P  low   LBBB (left bundle branch block) Chronic    PLAN  I reviewed this pt's case with Dr Irish Lack. It's hard to tell what her symptoms are from. She has some typical and some atypical symptoms. He feels it would be best to proceed with a diagnostic cath before proceeding with an ICD. This will be arranged for next week. I did ask her to take 12.5 mg of Aldactone daily, she was taking 25 mg daily by mistake.   [facility] 06/27/2014 4:13 PM

## 2014-06-27 NOTE — Telephone Encounter (Signed)
Pt aware cardiac cath is scheduled on 3/1 @ 10:30am with Dr.Varanasi Her device implantation with Dr.Allred will be rescheduled: TBD She had pre cath labs done while @ [facility] from her last hospitalization on 2/18-2/20 Pt given verbal cath instructions with verbal understanding.written instructions mailed

## 2014-06-27 NOTE — Assessment & Plan Note (Signed)
s/p rad' and chemo  in 1975-1977 b. s/p splenectomy 1970

## 2014-06-27 NOTE — Patient Instructions (Signed)
Your physician has recommended you make the following change in your medication:  1) REDUCE Aldactone to 1/2 tablet (12.5mg ) daily   We will speak with Dr.Varanasi and call you this afternoon with any updates

## 2014-06-27 NOTE — Assessment & Plan Note (Signed)
Low risk Myoview May 2014

## 2014-06-27 NOTE — Assessment & Plan Note (Signed)
Stable since discharge

## 2014-06-28 ENCOUNTER — Other Ambulatory Visit: Payer: Self-pay | Admitting: Physician Assistant

## 2014-06-28 ENCOUNTER — Telehealth: Payer: Self-pay | Admitting: Physician Assistant

## 2014-06-28 DIAGNOSIS — R079 Chest pain, unspecified: Secondary | ICD-10-CM

## 2014-06-28 MED ORDER — NITROGLYCERIN 0.4 MG SL SUBL: 0.4000 mg | SUBLINGUAL_TABLET | SUBLINGUAL | Status: DC | PRN

## 2014-06-28 NOTE — Telephone Encounter (Signed)
Ms. Franzen is having more frequent episodes of chest pain and wanted to know what to do. The pain is not severe, and she prefers to wait to come to the hospital until her scheduled catheterization on 03/01.  Advised her I could send a prescription in for nitroglycerin, which was done. Advised her that if the nitroglycerin does not work, she will need to call 911 and come to the emergency room. The patient was in agreement with this as planned.  Rosaria Ferries, PA-C 06/28/2014 1:15 PM Beeper [phone]

## 2014-06-30 ENCOUNTER — Telehealth: Payer: Self-pay | Admitting: Interventional Cardiology

## 2014-06-30 DIAGNOSIS — I447 Left bundle-branch block, unspecified: Secondary | ICD-10-CM | POA: Diagnosis not present

## 2014-06-30 DIAGNOSIS — Z87891 Personal history of nicotine dependence: Secondary | ICD-10-CM | POA: Diagnosis not present

## 2014-06-30 DIAGNOSIS — Z8572 Personal history of non-Hodgkin lymphomas: Secondary | ICD-10-CM | POA: Diagnosis not present

## 2014-06-30 DIAGNOSIS — K219 Gastro-esophageal reflux disease without esophagitis: Secondary | ICD-10-CM | POA: Diagnosis not present

## 2014-06-30 DIAGNOSIS — F1210 Cannabis abuse, uncomplicated: Secondary | ICD-10-CM | POA: Diagnosis not present

## 2014-06-30 DIAGNOSIS — F329 Major depressive disorder, single episode, unspecified: Secondary | ICD-10-CM | POA: Diagnosis not present

## 2014-06-30 DIAGNOSIS — I429 Cardiomyopathy, unspecified: Secondary | ICD-10-CM | POA: Diagnosis present

## 2014-06-30 DIAGNOSIS — M109 Gout, unspecified: Secondary | ICD-10-CM | POA: Diagnosis not present

## 2014-06-30 DIAGNOSIS — Z886 Allergy status to analgesic agent status: Secondary | ICD-10-CM | POA: Diagnosis not present

## 2014-06-30 DIAGNOSIS — Z9049 Acquired absence of other specified parts of digestive tract: Secondary | ICD-10-CM | POA: Diagnosis not present

## 2014-06-30 DIAGNOSIS — E039 Hypothyroidism, unspecified: Secondary | ICD-10-CM | POA: Diagnosis not present

## 2014-06-30 DIAGNOSIS — I428 Other cardiomyopathies: Secondary | ICD-10-CM | POA: Diagnosis not present

## 2014-06-30 DIAGNOSIS — Z981 Arthrodesis status: Secondary | ICD-10-CM | POA: Diagnosis not present

## 2014-06-30 DIAGNOSIS — I951 Orthostatic hypotension: Secondary | ICD-10-CM | POA: Diagnosis not present

## 2014-06-30 MED ORDER — GENTAMICIN SULFATE 40 MG/ML IJ SOLN
80.0000 mg | INTRAMUSCULAR | Status: DC
  Filled 2014-06-30: qty 2

## 2014-06-30 MED ORDER — CEFAZOLIN SODIUM-DEXTROSE 2-3 GM-% IV SOLR: 2.0000 g | INTRAVENOUS | Status: DC

## 2014-06-30 MED ORDER — SODIUM CHLORIDE 0.9 % IV SOLN: INTRAVENOUS | Status: DC

## 2014-06-30 MED ORDER — CHLORHEXIDINE GLUCONATE 4 % EX LIQD
60.0000 mL | Freq: Once | CUTANEOUS | Status: DC
  Filled 2014-06-30: qty 60

## 2014-06-30 NOTE — Telephone Encounter (Signed)
New Msg         Pt friend Matilde Haymaker calling states pt is spotting at the moment and thinks it may be due to Lasix.  Pt is postmenopausal.  Please return call.

## 2014-06-30 NOTE — Telephone Encounter (Signed)
OK for cath.  Would check with her GYN MD as well.

## 2014-06-30 NOTE — Telephone Encounter (Signed)
I spoke with the patient and she is aware of Dr. Hassell Done recommendations.

## 2014-06-30 NOTE — Telephone Encounter (Signed)
Patient spotting vaginally over the past couple of days. She is post-menopausal. She takes Lasix and Aldactone. She is calling to determine if this is any cause for concern especially in light of her upcoming procedures (cath/pacemaker). Routing to Dr. Irish Lack.

## 2014-07-01 ENCOUNTER — Encounter (HOSPITAL_COMMUNITY)
Admission: RE | Disposition: A | Discharge status: Home / Self Care | Payer: Self-pay | Source: Ambulatory Visit | Attending: Internal Medicine

## 2014-07-01 ENCOUNTER — Ambulatory Visit (HOSPITAL_COMMUNITY)
Admission: RE | Admit: 2014-07-01 | Discharge: 2014-07-01 | Disposition: A | Discharge status: Home / Self Care | Payer: BC Managed Care – PPO | Source: Ambulatory Visit | Attending: Internal Medicine | Admitting: Internal Medicine

## 2014-07-01 ENCOUNTER — Encounter (HOSPITAL_COMMUNITY): Payer: Self-pay | Admitting: Interventional Cardiology

## 2014-07-01 DIAGNOSIS — R079 Chest pain, unspecified: Secondary | ICD-10-CM | POA: Insufficient documentation

## 2014-07-01 DIAGNOSIS — I447 Left bundle-branch block, unspecified: Secondary | ICD-10-CM | POA: Insufficient documentation

## 2014-07-01 DIAGNOSIS — Z9049 Acquired absence of other specified parts of digestive tract: Secondary | ICD-10-CM | POA: Insufficient documentation

## 2014-07-01 DIAGNOSIS — Z87891 Personal history of nicotine dependence: Secondary | ICD-10-CM | POA: Insufficient documentation

## 2014-07-01 DIAGNOSIS — R0789 Other chest pain: Secondary | ICD-10-CM

## 2014-07-01 DIAGNOSIS — F1210 Cannabis abuse, uncomplicated: Secondary | ICD-10-CM | POA: Insufficient documentation

## 2014-07-01 DIAGNOSIS — F329 Major depressive disorder, single episode, unspecified: Secondary | ICD-10-CM | POA: Insufficient documentation

## 2014-07-01 DIAGNOSIS — I428 Other cardiomyopathies: Secondary | ICD-10-CM | POA: Insufficient documentation

## 2014-07-01 DIAGNOSIS — M109 Gout, unspecified: Secondary | ICD-10-CM | POA: Insufficient documentation

## 2014-07-01 DIAGNOSIS — K219 Gastro-esophageal reflux disease without esophagitis: Secondary | ICD-10-CM | POA: Insufficient documentation

## 2014-07-01 DIAGNOSIS — E039 Hypothyroidism, unspecified: Secondary | ICD-10-CM | POA: Insufficient documentation

## 2014-07-01 DIAGNOSIS — Z981 Arthrodesis status: Secondary | ICD-10-CM | POA: Insufficient documentation

## 2014-07-01 DIAGNOSIS — Z886 Allergy status to analgesic agent status: Secondary | ICD-10-CM | POA: Insufficient documentation

## 2014-07-01 DIAGNOSIS — I951 Orthostatic hypotension: Secondary | ICD-10-CM | POA: Insufficient documentation

## 2014-07-01 DIAGNOSIS — I420 Dilated cardiomyopathy: Secondary | ICD-10-CM | POA: Insufficient documentation

## 2014-07-01 DIAGNOSIS — Z8572 Personal history of non-Hodgkin lymphomas: Secondary | ICD-10-CM | POA: Insufficient documentation

## 2014-07-01 HISTORY — PX: LEFT HEART CATHETERIZATION WITH CORONARY ANGIOGRAM: SHX5451

## 2014-07-01 SURGERY — BI-VENTRICULAR IMPLANTABLE CARDIOVERTER DEFIBRILLATOR  (CRT-D)

## 2014-07-01 SURGERY — LEFT HEART CATHETERIZATION WITH CORONARY ANGIOGRAM
Anesthesia: LOCAL

## 2014-07-01 MED ORDER — SODIUM CHLORIDE 0.9 % IV SOLN: INTRAVENOUS | Status: AC

## 2014-07-01 MED ORDER — HEPARIN (PORCINE) IN NACL 2-0.9 UNIT/ML-% IJ SOLN
INTRAMUSCULAR | Status: AC
  Filled 2014-07-01: qty 1000

## 2014-07-01 MED ORDER — SODIUM CHLORIDE 0.9 % IJ SOLN: 3.0000 mL | Freq: Two times a day (BID) | INTRAMUSCULAR | Status: DC

## 2014-07-01 MED ORDER — VERAPAMIL HCL 2.5 MG/ML IV SOLN
INTRAVENOUS | Status: AC
  Filled 2014-07-01: qty 2

## 2014-07-01 MED ORDER — HEPARIN SODIUM (PORCINE) 1000 UNIT/ML IJ SOLN
INTRAMUSCULAR | Status: AC
  Filled 2014-07-01: qty 1

## 2014-07-01 MED ORDER — ASPIRIN 81 MG PO CHEW
324.0000 mg | CHEWABLE_TABLET | ORAL | Status: AC
  Administered 2014-07-01: 324 mg via ORAL

## 2014-07-01 MED ORDER — NITROGLYCERIN 1 MG/10 ML FOR IR/CATH LAB
INTRA_ARTERIAL | Status: AC
  Filled 2014-07-01: qty 10

## 2014-07-01 MED ORDER — FENTANYL CITRATE 0.05 MG/ML IJ SOLN
INTRAMUSCULAR | Status: AC
  Filled 2014-07-01: qty 2

## 2014-07-01 MED ORDER — SODIUM CHLORIDE 0.9 % IJ SOLN: 3.0000 mL | INTRAMUSCULAR | Status: DC | PRN

## 2014-07-01 MED ORDER — MIDAZOLAM HCL 2 MG/2ML IJ SOLN
INTRAMUSCULAR | Status: AC
  Filled 2014-07-01: qty 2

## 2014-07-01 MED ORDER — ASPIRIN 81 MG PO CHEW
CHEWABLE_TABLET | ORAL | Status: AC
  Filled 2014-07-01: qty 1

## 2014-07-01 MED ORDER — SODIUM CHLORIDE 0.9 % IV SOLN
INTRAVENOUS | Status: DC
  Administered 2014-07-01: 10:00:00 via INTRAVENOUS

## 2014-07-01 MED ORDER — ASPIRIN 81 MG PO CHEW
CHEWABLE_TABLET | ORAL | Status: AC
  Filled 2014-07-01: qty 3

## 2014-07-01 MED ORDER — SODIUM CHLORIDE 0.9 % IV SOLN: 250.0000 mL | INTRAVENOUS | Status: DC | PRN

## 2014-07-01 MED ORDER — LIDOCAINE HCL (PF) 1 % IJ SOLN
INTRAMUSCULAR | Status: AC
  Filled 2014-07-01: qty 30

## 2014-07-01 NOTE — Interval H&P Note (Signed)
Cath Lab Visit (complete for each Cath Lab visit)  Clinical Evaluation Leading to the Procedure:   ACS: No.  Non-ACS:    Anginal Classification: CCS III  Anti-ischemic medical therapy: Minimal Therapy (1 class of medications)  Non-Invasive Test Results: No non-invasive testing performed  Prior CABG: No previous CABG   Ischemic Symptoms? CCS III (Marked limitation of ordinary activity) Anti-ischemic Medical Therapy? Minimal Therapy (1 class of medications) Non-invasive Test Results? No non-invasive testing performed Prior CABG? No Previous CABG   Patient Information:   1-2V CAD, no prox LAD  A (7)  Indication: 20; Score: 7   Patient Information:   1-2V-CAD with DS 50-60% With No FFR, No IVUS  I (3)  Indication: 21; Score: 3   Patient Information:   1-2V-CAD with DS 50-60% With FFR  A (7)  Indication: 22; Score: 7   Patient Information:   1-2V-CAD with DS 50-60% With FFR>0.8, IVUS not significant  I (2)  Indication: 23; Score: 2   Patient Information:   3V-CAD without LMCA With Abnormal LV systolic function  A (9)  Indication: 48; Score: 9   Patient Information:   LMCA-CAD  A (9)  Indication: 49; Score: 9   Patient Information:   2V-CAD with prox LAD PCI  A (7)  Indication: 62; Score: 7   Patient Information:   2V-CAD with prox LAD CABG  A (8)  Indication: 62; Score: 8   Patient Information:   3V-CAD without LMCA With Low CAD burden(i.e., 3 focal stenoses, low SYNTAX score) PCI  A (7)  Indication: 63; Score: 7   Patient Information:   3V-CAD without LMCA With Low CAD burden(i.e., 3 focal stenoses, low SYNTAX score) CABG  A (9)  Indication: 63; Score: 9   Patient Information:   3V-CAD without LMCA E06c - Intermediate-high CAD burden (i.e., multiple diffuse lesions, presence of CTO, or high SYNTAX score) PCI  U (4)  Indication: 64; Score: 4   Patient Information:   3V-CAD without LMCA E06c -  Intermediate-high CAD burden (i.e., multiple diffuse lesions, presence of CTO, or high SYNTAX score) CABG  A (9)  Indication: 64; Score: 9   Patient Information:   LMCA-CAD With Isolated LMCA stenosis  PCI  U (6)  Indication: 65; Score: 6   Patient Information:   LMCA-CAD With Isolated LMCA stenosis  CABG  A (9)  Indication: 65; Score: 9   Patient Information:   LMCA-CAD Additional CAD, low CAD burden (i.e., 1- to 2-vessel additional involvement, low SYNTAX score) PCI  U (5)  Indication: 66; Score: 5   Patient Information:   LMCA-CAD Additional CAD, low CAD burden (i.e., 1- to 2-vessel additional involvement, low SYNTAX score) CABG  A (9)  Indication: 66; Score: 9   Patient Information:   LMCA-CAD Additional CAD, intermediate-high CAD burden (i.e., 3-vessel involvement, presence of CTO, or high SYNTAX score) PCI  I (3)  Indication: 67; Score: 3   Patient Information:   LMCA-CAD Additional CAD, intermediate-high CAD burden (i.e., 3-vessel involvement, presence of CTO, or high SYNTAX score) CABG  A (9)  Indication: 67; Score: 9    History and Physical Interval Note:  07/01/2014 11:43 AM  Sylvia Moore  has presented today for surgery, with the diagnosis of cp  The various methods of treatment have been discussed with the patient and family. After consideration of risks, benefits and other options for treatment, the patient has consented to  Procedure(s): LEFT HEART CATHETERIZATION WITH CORONARY ANGIOGRAM (N/A) as  a surgical intervention .  The patient's history has been reviewed, patient examined, no change in status, stable for surgery.  I have reviewed the patient's chart and labs.  Questions were answered to the patient's satisfaction.     Giavana Rooke S.

## 2014-07-01 NOTE — CV Procedure (Signed)
       PROCEDURE:  Left heart catheterization with selective coronary angiography, left ventriculogram.  INDICATIONS:  Cardiomyopathy, chest pain  The risks, benefits, and details of the procedure were explained to the patient.  The patient verbalized understanding and wanted to proceed.  Informed written consent was obtained.  PROCEDURE TECHNIQUE:  After Xylocaine anesthesia a 76F slender sheath was placed in the right radial artery with a single anterior needle wall stick.   IV Heparin was given.  Right coronary angiography was done using a Judkins R4 guide catheter.  Left coronary angiography was done using a Judkins L3.5 guide catheter.  Left ventriculography was done using a pigtail catheter.  A TR band was used for hemostasis.   CONTRAST:  Total of 50 cc.  COMPLICATIONS:  None.    HEMODYNAMICS:  Aortic pressure was 109/60; LV pressure was 105/7; LVEDP 15.  There was no gradient between the left ventricle and aorta.    ANGIOGRAPHIC DATA:   The left main coronary artery is widely patent.  The left anterior descending artery is a large vessel which wraps around the apex. There are several small diagonals which are patent. There is a very large diagonal which is also widely patent.  The left circumflex artery is a small vessel. The circumflex is patent. There is a small obtuse marginal which is widely patent.  The right coronary artery is a large, dominant vessel. The RCA supplies the lateral wall. There is mild, proximal atherosclerosis. The posterior lateral artery is very large and has multiple branches across the lateral wall which are all widely patent. The posterior descending artery is medium-sized and patent.  LEFT VENTRICULOGRAM:  Left ventricular angiogram was done in the 30 RAO projection and revealed severely decreased global left ventricular systolic function with an estimated ejection fraction of 25 %.  LVEDP was 15 mmHg.  IMPRESSIONS:  1. Widely patent left main  coronary artery. 2. Widely patent left anterior descending artery and its branches. 3. Patent, small left circumflex artery and its branches. 4. Very large, dominant patent right coronary artery. 5. Normal left ventricular systolic function.  LVEDP 15 mmHg.  Ejection fraction 25%. 6.   Nonischemic cardiomyopathy.  RECOMMENDATION:  Continue aggressive medical therapy for her cardiomyopathy. Will forward results to Dr. Rayann Heman. Plans for electrophysiology procedure in the near future.

## 2014-07-01 NOTE — Discharge Instructions (Signed)
Radial Site Care °Refer to this sheet in the next few weeks. These instructions provide you with information on caring for yourself after your procedure. Your caregiver may also give you more specific instructions. Your treatment has been planned according to current medical practices, but problems sometimes occur. Call your caregiver if you have any problems or questions after your procedure. °HOME CARE INSTRUCTIONS °· You may shower the day after the procedure. Remove the bandage (dressing) and gently wash the site with plain soap and water. Gently pat the site dry. °· Do not apply powder or lotion to the site. °· Do not submerge the affected site in water for 3 to 5 days. °· Inspect the site at least twice daily. °· Do not flex or bend the affected arm for 24 hours. °· No lifting over 5 pounds (2.3 kg) for 5 days after your procedure. °· Do not drive home if you are discharged the same day of the procedure. Have someone else drive you. °· You may drive 24 hours after the procedure unless otherwise instructed by your caregiver. °· Do not operate machinery or power tools for 24 hours. °· A responsible adult should be with you for the first 24 hours after you arrive home. °What to expect: °· Any bruising will usually fade within 1 to 2 weeks. °· Blood that collects in the tissue (hematoma) may be painful to the touch. It should usually decrease in size and tenderness within 1 to 2 weeks. °SEEK IMMEDIATE MEDICAL CARE IF: °· You have unusual pain at the radial site. °· You have redness, warmth, swelling, or pain at the radial site. °· You have drainage (other than a small amount of blood on the dressing). °· You have chills. °· You have a fever or persistent symptoms for more than 72 hours. °· You have a fever and your symptoms suddenly get worse. °· Your arm becomes pale, cool, tingly, or numb. °· You have heavy bleeding from the site. Hold pressure on the site and call 911. °Document Released: 05/21/2010 Document  Revised: 07/11/2011 Document Reviewed: 05/21/2010 °[employer]® Patient Information ©2015 [employer]. This information is not intended to replace advice given to you by your health care provider. Make sure you discuss any questions you have with your health care provider. ° °

## 2014-07-01 NOTE — Progress Notes (Signed)
Called and spoke with Dr. Irish Lack about patient's SPB ranging in the high 80's, low 90's.  Patient's BP normally runs SBP in the 100's.  Patient states that she feels fine.  Dr. Irish Lack states that patient can still go home if feeling stable at discharge.

## 2014-07-01 NOTE — H&P (View-Only) (Signed)
ELECTROPHYSIOLOGY CONSULT NOTE    Patient ID: Sylvia Moore MRN: [id number], DOB/AGE: 1950-08-13 64 y.o.  Admit date: 06/18/2014 Date of Consult: 06/20/2014  Primary Physician: Kandice Hams, MD Primary Cardiologist: Irish Lack  Reason for Consultation: non ischemic cardiomyopathy, LBB  HPI:  Sylvia Moore is a 64 y.o. 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  She was admitted on 06/18/14 with heart failure and has been diuresed.  Medication titration is limited by hypotension.  EP has been asked to evaluate for consideration of CRT.   Echocardiogram 06/19/14 demonstrated EF 86-76%, grade 2 diastolic dysfunction, moderate MR, moderate TR, LA 34.  Echo 2012 demonstrated EF 40-45%, Myoview 2014 demonstrated no ischemia and EF 35%.   Lab work this admission is significant for creat 1.49 on admission (1.38 today), negative enzymes, WBC 12.3, Hgb 11.7, TSH 6.101.  She reports significant improvement in chest tightness and shortness of breath with diuresis.  She thinks she is close to baseline.  She did have some recurrent chest tightness this morning but states it was during an emotional time.  No exertional chest discomfort.  She denies palpitations.  She has occasional orthostatic intolerance.  No pre-syncope or syncope.  No recent fevers, chills, nausea, vomiting.  ROS is otherwise negative.   Past Medical History  Diagnosis Date  . Chronic systolic heart failure   . Depression   . Hypothyroidism   . Gout   . HOH (hard of hearing)     has hearing aids  . Orthostatic hypotension   . Schwannoma of spinal cord     T3-T4  . Orthostatic hypotension        . NICM (nonischemic cardiomyopathy)     a. 2/2 radiation and chemotherapy b. echo 2/16  EF 20-25% c. non ischemic myoview 5/14  . Hodgkin's lymphoma     a. s/p rad'  and chemo  in 1975-1977 b. s/p splenectomy 1970  . GERD (gastroesophageal reflux disease)      Surgical History:  Past Surgical History  Procedure Laterality Date  . Hysteroscopy w/d&c    . Colonoscopy w/ polypectomy    . Lumbar fusion  07/2011    T11-L1 posterior lateral  . Dilation and curettage of uterus    . Abdominal exploration surgery  1975    r/t hodgkins disease - had chemo and rad  . Left knee surgery    . Colposcopy N/A 08/14/2012    Procedure: COLPOSCOPY;  Surgeon: [name], MD;  Location: [facility];  Service: Gynecology;  Laterality: N/A;  Colpo of Vulva and Vagina  . Lesion removal N/A 08/14/2012    Procedure: EXCISION VAGINAL LESION;  Surgeon: [name], MD;  Location: [facility];  Service: Gynecology;  Laterality: N/A;  Wide Local Excision of Right Labia Majori  . Rectal biopsy N/A 08/14/2012    Procedure: BIOPSY RECTAL;  Surgeon: [name], MD;  Location: [facility];  Service: Gynecology;  Laterality: N/A;  peri-anal biopsy  . Cholecystectomy  10 yrs ago  . Esophagogastroduodenoscopy (egd) with propofol N/A 01/20/2014    Procedure: ESOPHAGOGASTRODUODENOSCOPY (EGD) WITH PROPOFOL;  Surgeon: [name], MD;  Location: [facility];  Service: Endoscopy;  Laterality: N/A;  . Colonoscopy with propofol N/A 01/20/2014    Procedure: COLONOSCOPY WITH PROPOFOL;  Surgeon: [name], MD;  Location: [facility];  Service: Endoscopy;  Laterality: N/A;  . Knee arthroscopy Right  2015     Prescriptions prior to admission  Medication Sig Dispense Refill Last Dose  . allopurinol (ZYLOPRIM) 100 MG tablet Take 100 mg by mouth 2 (two) times daily.   06/17/2014 at Unknown time  . carvedilol (COREG) 12.5 MG tablet TAKE 1 TABLET TWICE DAILY WITH FOOD. 60 tablet 6 06/17/2014 at 2100  . levothyroxine (SYNTHROID, LEVOTHROID) 125 MCG tablet Take 125 mcg by mouth daily.   06/17/2014 at Unknown time  . Multiple Vitamins-Minerals (MULTIVITAMIN PO) Take 1 tablet by mouth daily.   06/17/2014  at Unknown time  . omeprazole (PRILOSEC OTC) 20 MG tablet Take 20 mg by mouth daily.   06/17/2014 at Unknown time  . spironolactone (ALDACTONE) 25 MG tablet TAKE 1 TABLET ONCE DAILY. 30 tablet 6 06/17/2014 at Unknown time  . valsartan (DIOVAN) 80 MG tablet Take 80 mg by mouth at bedtime.   Past Week at Unknown time  . venlafaxine XR (EFFEXOR-XR) 37.5 MG 24 hr capsule Take 37.5 mg by mouth daily with breakfast.   06/18/2014 at Unknown time  . ibuprofen (ADVIL,MOTRIN) 200 MG tablet Take 400 mg by mouth every 6 (six) hours as needed for mild pain or moderate pain.   Unk    Inpatient Medications:  . allopurinol  100 mg Oral BID  . aspirin EC  81 mg Oral Daily  . atorvastatin  40 mg Oral q1800  . carvedilol  6.25 mg Oral BID WC  . furosemide  20 mg Oral Daily  . heparin  5,000 Units Subcutaneous 3 times per day  . levothyroxine  125 mcg Oral QAC breakfast  . pantoprazole  40 mg Oral Daily  . spironolactone  12.5 mg Oral Daily  . venlafaxine XR  37.5 mg Oral Q breakfast    Allergies:  Allergies  Allergen Reactions  . Codeine Nausea And Vomiting    History   Social History  . Marital Status: Single    Spouse Name: N/A  . Number of Children: N/A  . Years of Education: N/A   Occupational History  . Not on file.   Social History Main Topics  . Smoking status: Former Smoker -- 0.25 packs/day for 1 years    Types: Cigarettes    Quit date: 05/02/1968  . Smokeless tobacco: Never Used  . Alcohol Use: Yes     Comment: weekends  . Drug Use: Yes    Special: Marijuana     Comment: for 8 yrs - quit 28 yrs ago  . Sexual Activity: Not on file   Other Topics Concern  . Not on file   Social History Narrative     Family History  Problem Relation Age of Onset  . Hypertension Father   . Stroke Father   . Heart failure Father   . Pneumonia Mother   . Healthy Sister   . Healthy Brother     BP 100/64 mmHg  Pulse 92  Temp(Src) 98.4 F (36.9 C) (Oral)  Resp 16  Ht 5\' 6"  (1.676 m)   Wt 212 lb 9.6 oz (96.435 kg)  BMI 34.33 kg/m2  SpO2 96%  Physical Exam: Filed Vitals:   06/19/14 2120 06/20/14 0214 06/20/14 0613 06/20/14 0930  BP: 106/59 92/55 93/55  100/64  Pulse: 88 89 88 92  Temp: 98 F (36.7 C)  98 F (36.7 C) 98.4 F (36.9 C)  TempSrc: Oral  Oral Oral  Resp: 17 18 16 16   Height:      Weight:   212 lb 9.6 oz (96.435  kg)   SpO2: 95% 92% 93% 96%    GEN- The patient is well appearing, alert and oriented x 3 today.   Head- normocephalic, atraumatic Eyes-  Sclera clear, conjunctiva pink Ears- hearing intact Oropharynx- clear Neck- supple, Lungs- Clear to ausculation bilaterally, normal work of breathing Heart- Regular rate and rhythm, no murmurs, rubs or gallops, PMI not laterally displaced GI- soft, NT, ND, + BS Extremities- no clubbing, cyanosis, or edema MS- no significant deformity or atrophy Skin- no rash or lesion Psych- euthymic mood, full affect Neuro- strength and sensation are intact     Labs:   Lab Results  Component Value Date   WBC 12.3* 06/19/2014   HGB 11.7* 06/19/2014   HCT 34.8* 06/19/2014   MCV 100.0 06/19/2014   PLT 334 06/19/2014    Recent Labs Lab 06/18/14 1352  06/20/14 0555  NA  --   < > 136  K  --   < > 3.9  CL  --   < > 101  CO2  --   < > 29  BUN  --   < > 25*  CREATININE  --   < > 1.38*  CALCIUM  --   < > 9.6  PROT 9.0*  --   --   BILITOT 1.4*  --   --   ALKPHOS 106  --   --   ALT 29  --   --   AST 35  --   --   GLUCOSE  --   < > 91  < > = values in this interval not displayed.   Radiology/Studies: Dg Chest 2 View 06/17/2014   CLINICAL DATA:  There is no evidence of pneumonia nor other acute cardiopulmonary abnormality.  EXAM: CHEST  2 VIEW  COMPARISON:  PA and lateral chest x-ray of July 10, 2013  FINDINGS: The lungs are slightly less well inflated today than on the previous study. There is no focal infiltrate. There is stable linear scarring in the retrosternal region. There is no pleural effusion. The  cardiac silhouette is top-normal in size. The pulmonary vascularity is not engorged. The mediastinum is normal in width. There is metallic hardware from lower thoracic and upper lumbar posterior fusion.  IMPRESSION: Borderline enlargement of cardiac silhouette without evidence of pulmonary edema. No active cardiopulmonary disease.   Electronically Signed   By: David  Martinique   On: 06/17/2014 08:41   Ct Angio Chest Pe W/cm &/or Wo Cm 06/17/2014   CLINICAL DATA:  Short of breath. History Hodgkin's lymphoma 1975. Chemo and radiation.  EXAM: CT ANGIOGRAPHY CHEST WITH CONTRAST  TECHNIQUE: Multidetector CT imaging of the chest was performed using the standard protocol during bolus administration of intravenous contrast. Multiplanar CT image reconstructions and MIPs were obtained to evaluate the vascular anatomy.  CONTRAST:  147mL OMNIPAQUE IOHEXOL 350 MG/ML SOLN  COMPARISON:  CT chest 07/11/2013  FINDINGS: Negative for pulmonary embolism. Mild cardiac enlargement. Negative for pericardial effusion. Mild atherosclerotic aortic arch without aneurysm. Lack of adequate opacification precludes the ability to evaluate for aortic dissection.  Subpleural mass in the left posterior apex is unchanged from the prior study. This measures approximately 16 x 34 mm and has homogeneous density and does not appear to enhance. This extends toward the T3-4 foramen which is not enlarged. No bony destruction or remodeling is present. This is unchanged from a CT of 08/06/2012 and appears stable. This may represent a nerve sheath tumor of an intercostal nerve or possibly a cyst such  is a lateral meningocele. Density of the mass is similar to that of water.  Scarring in the medial apex bilaterally is stable and consistent with prior radiation treatment. Negative for pneumonia or effusion. No adenopathy in the mediastinum.  Review of the MIP images confirms the above findings.  IMPRESSION: Negative for pulmonary embolism.  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 No new findings.   Electronically Signed   By: Franchot Gallo M.D.   On: 06/17/2014 13:22    SKS:HNGIT rhythm, LBBB, QRS 134  TELEMETRY: sinus rhythm, occasional PVC's  A/P 1. Nonischemic CM (EF 20%), NYHA Class III CHF The patient has a nonischemic CM (EF 20%), NYHA Class III CHF, and LBBB. At this time, she meets SCD-HeFT criteria for ICD implantation for primary prevention of sudden death. Given LBBB QRS >130 msec she also may benefit from CRT (Class IIa indiation).  Risks, benefits, alternatives to BiV ICD implantation were discussed in detail with the patient today. The patient  understands that the risks include but are not limited to bleeding, infection, pneumothorax, perforation, tamponade, vascular damage, renal failure, MI, stroke, death, inappropriate shocks, and lead dislodgement and wishes to proceed.  To reduce risks of infection and other procedural complications, I would advise that she is discharged and return for the procedure rather than proceed this admission. I have therefore scheduled her for the procedure on March 1st as second case.  She will need to come to [facility] short stay for this procedure at 9am. She will continue diuresis while here and will likely be discharged over the weekend.  Electrophysiology team to see as needed while here. Please call with questions.

## 2014-07-02 ENCOUNTER — Other Ambulatory Visit: Payer: Self-pay | Admitting: Interventional Cardiology

## 2014-07-02 ENCOUNTER — Other Ambulatory Visit: Payer: Self-pay | Admitting: *Deleted

## 2014-07-02 ENCOUNTER — Encounter: Payer: Self-pay | Admitting: *Deleted

## 2014-07-02 ENCOUNTER — Ambulatory Visit: Payer: BC Managed Care – PPO | Admitting: Cardiology

## 2014-07-02 ENCOUNTER — Encounter: Payer: Self-pay | Admitting: Internal Medicine

## 2014-07-02 DIAGNOSIS — I509 Heart failure, unspecified: Secondary | ICD-10-CM

## 2014-07-03 ENCOUNTER — Encounter: Payer: Self-pay | Admitting: *Deleted

## 2014-07-03 NOTE — Telephone Encounter (Signed)
lmom for patient that we could do procedure on 3/8 if she wants and to call bac

## 2014-07-04 ENCOUNTER — Other Ambulatory Visit (INDEPENDENT_AMBULATORY_CARE_PROVIDER_SITE_OTHER): Payer: BC Managed Care – PPO | Admitting: *Deleted

## 2014-07-04 DIAGNOSIS — I509 Heart failure, unspecified: Secondary | ICD-10-CM

## 2014-07-04 LAB — BASIC METABOLIC PANEL
BUN: 24 mg/dL — AB (ref 6–23)
CO2: 31 meq/L (ref 19–32)
Calcium: 10.7 mg/dL — ABNORMAL HIGH (ref 8.4–10.5)
Chloride: 100 mEq/L (ref 96–112)
Creatinine, Ser: 1.03 mg/dL (ref 0.40–1.20)
GFR: 57.43 mL/min — AB (ref >60.00)
Glucose, Bld: 98 mg/dL (ref 70–99)
Potassium: 4.7 mEq/L (ref 3.5–5.1)
Sodium: 133 mEq/L — ABNORMAL LOW (ref 135–145)

## 2014-07-04 LAB — CBC WITH DIFFERENTIAL/PLATELET
BASOS PCT: 0.8 % (ref 0.0–3.0)
Basophils Absolute: 0.1 10*3/uL (ref 0.0–0.1)
EOS ABS: 0.6 10*3/uL (ref 0.0–0.7)
Eosinophils Relative: 6.1 % — ABNORMAL HIGH (ref 0.0–5.0)
HCT: 40.4 % (ref 36.0–46.0)
Hemoglobin: 13.9 g/dL (ref 12.0–15.0)
LYMPHS ABS: 3 10*3/uL (ref 0.7–4.0)
Lymphocytes Relative: 32.6 % (ref 12.0–46.0)
MCHC: 34.3 g/dL (ref 30.0–36.0)
MCV: 98.8 fl (ref 78.0–100.0)
MONO ABS: 0.8 10*3/uL (ref 0.1–1.0)
Monocytes Relative: 8.6 % (ref 3.0–12.0)
NEUTROS PCT: 51.9 % (ref 43.0–77.0)
Neutro Abs: 4.7 10*3/uL (ref 1.4–7.7)
Platelets: 398 10*3/uL (ref 150.0–400.0)
RBC: 4.09 Mil/uL (ref 3.87–5.11)
RDW: 13.6 % (ref 11.5–15.5)
WBC: 9.1 10*3/uL (ref 4.0–10.5)

## 2014-07-04 NOTE — Addendum Note (Signed)
Addended by: Eulis Foster on: 07/04/2014 09:10 AM   Modules accepted: Orders

## 2014-07-07 DIAGNOSIS — K219 Gastro-esophageal reflux disease without esophagitis: Secondary | ICD-10-CM | POA: Diagnosis not present

## 2014-07-07 DIAGNOSIS — I10 Essential (primary) hypertension: Secondary | ICD-10-CM | POA: Diagnosis not present

## 2014-07-07 DIAGNOSIS — Z8571 Personal history of Hodgkin lymphoma: Secondary | ICD-10-CM | POA: Diagnosis not present

## 2014-07-07 DIAGNOSIS — Z79899 Other long term (current) drug therapy: Secondary | ICD-10-CM | POA: Diagnosis not present

## 2014-07-07 DIAGNOSIS — I447 Left bundle-branch block, unspecified: Secondary | ICD-10-CM | POA: Diagnosis not present

## 2014-07-07 DIAGNOSIS — I429 Cardiomyopathy, unspecified: Secondary | ICD-10-CM | POA: Diagnosis present

## 2014-07-07 DIAGNOSIS — E039 Hypothyroidism, unspecified: Secondary | ICD-10-CM | POA: Diagnosis not present

## 2014-07-07 DIAGNOSIS — Z87891 Personal history of nicotine dependence: Secondary | ICD-10-CM | POA: Diagnosis not present

## 2014-07-07 DIAGNOSIS — I5022 Chronic systolic (congestive) heart failure: Secondary | ICD-10-CM | POA: Diagnosis not present

## 2014-07-07 DIAGNOSIS — I951 Orthostatic hypotension: Secondary | ICD-10-CM | POA: Diagnosis not present

## 2014-07-07 DIAGNOSIS — Z923 Personal history of irradiation: Secondary | ICD-10-CM | POA: Diagnosis not present

## 2014-07-07 DIAGNOSIS — Z9221 Personal history of antineoplastic chemotherapy: Secondary | ICD-10-CM | POA: Diagnosis not present

## 2014-07-07 DIAGNOSIS — F329 Major depressive disorder, single episode, unspecified: Secondary | ICD-10-CM | POA: Diagnosis not present

## 2014-07-07 MED ORDER — CEFAZOLIN SODIUM-DEXTROSE 2-3 GM-% IV SOLR: 2.0000 g | INTRAVENOUS | Status: DC

## 2014-07-07 MED ORDER — SODIUM CHLORIDE 0.9 % IR SOLN
80.0000 mg | Status: DC
  Filled 2014-07-07: qty 2

## 2014-07-08 ENCOUNTER — Encounter (HOSPITAL_COMMUNITY)
Admission: RE | Disposition: A | Discharge status: Home / Self Care | Payer: Self-pay | Source: Ambulatory Visit | Attending: Internal Medicine

## 2014-07-08 ENCOUNTER — Encounter (HOSPITAL_COMMUNITY): Payer: Self-pay | Admitting: General Practice

## 2014-07-08 ENCOUNTER — Ambulatory Visit (HOSPITAL_COMMUNITY)
Admission: RE | Admit: 2014-07-08 | Discharge: 2014-07-09 | Disposition: A | Discharge status: Home / Self Care | Payer: BC Managed Care – PPO | Source: Ambulatory Visit | Attending: Internal Medicine | Admitting: Internal Medicine

## 2014-07-08 DIAGNOSIS — Z79899 Other long term (current) drug therapy: Secondary | ICD-10-CM | POA: Insufficient documentation

## 2014-07-08 DIAGNOSIS — K219 Gastro-esophageal reflux disease without esophagitis: Secondary | ICD-10-CM | POA: Insufficient documentation

## 2014-07-08 DIAGNOSIS — I420 Dilated cardiomyopathy: Secondary | ICD-10-CM | POA: Diagnosis present

## 2014-07-08 DIAGNOSIS — Z8571 Personal history of Hodgkin lymphoma: Secondary | ICD-10-CM | POA: Insufficient documentation

## 2014-07-08 DIAGNOSIS — I509 Heart failure, unspecified: Secondary | ICD-10-CM

## 2014-07-08 DIAGNOSIS — Z9221 Personal history of antineoplastic chemotherapy: Secondary | ICD-10-CM | POA: Insufficient documentation

## 2014-07-08 DIAGNOSIS — F329 Major depressive disorder, single episode, unspecified: Secondary | ICD-10-CM | POA: Insufficient documentation

## 2014-07-08 DIAGNOSIS — I428 Other cardiomyopathies: Secondary | ICD-10-CM

## 2014-07-08 DIAGNOSIS — Z87891 Personal history of nicotine dependence: Secondary | ICD-10-CM | POA: Insufficient documentation

## 2014-07-08 DIAGNOSIS — I429 Cardiomyopathy, unspecified: Secondary | ICD-10-CM | POA: Diagnosis not present

## 2014-07-08 DIAGNOSIS — E039 Hypothyroidism, unspecified: Secondary | ICD-10-CM | POA: Insufficient documentation

## 2014-07-08 DIAGNOSIS — Z923 Personal history of irradiation: Secondary | ICD-10-CM | POA: Insufficient documentation

## 2014-07-08 DIAGNOSIS — I951 Orthostatic hypotension: Secondary | ICD-10-CM | POA: Insufficient documentation

## 2014-07-08 DIAGNOSIS — I5022 Chronic systolic (congestive) heart failure: Secondary | ICD-10-CM | POA: Insufficient documentation

## 2014-07-08 DIAGNOSIS — I10 Essential (primary) hypertension: Secondary | ICD-10-CM | POA: Insufficient documentation

## 2014-07-08 DIAGNOSIS — Z959 Presence of cardiac and vascular implant and graft, unspecified: Secondary | ICD-10-CM

## 2014-07-08 DIAGNOSIS — I447 Left bundle-branch block, unspecified: Secondary | ICD-10-CM | POA: Diagnosis present

## 2014-07-08 HISTORY — PX: BI-VENTRICULAR IMPLANTABLE CARDIOVERTER DEFIBRILLATOR: SHX5459

## 2014-07-08 HISTORY — DX: Inflammatory liver disease, unspecified: K75.9

## 2014-07-08 SURGERY — BI-VENTRICULAR IMPLANTABLE CARDIOVERTER DEFIBRILLATOR  (CRT-D)

## 2014-07-08 MED ORDER — VENLAFAXINE HCL ER 37.5 MG PO CP24
37.5000 mg | ORAL_CAPSULE | Freq: Every day | ORAL | Status: DC
  Administered 2014-07-09: 37.5 mg via ORAL
  Filled 2014-07-08 (×3): qty 1

## 2014-07-08 MED ORDER — LIDOCAINE HCL (PF) 1 % IJ SOLN
INTRAMUSCULAR | Status: AC
  Filled 2014-07-08: qty 30

## 2014-07-08 MED ORDER — SODIUM CHLORIDE 0.9 % IV SOLN: 250.0000 mL | INTRAVENOUS | Status: DC | PRN

## 2014-07-08 MED ORDER — HYDROCODONE-ACETAMINOPHEN 5-325 MG PO TABS
1.0000 | ORAL_TABLET | ORAL | Status: DC | PRN
  Administered 2014-07-08 – 2014-07-09 (×2): 2 via ORAL
  Filled 2014-07-08 (×2): qty 2

## 2014-07-08 MED ORDER — OMEPRAZOLE MAGNESIUM 20 MG PO TBEC: 20.0000 mg | DELAYED_RELEASE_TABLET | Freq: Every day | ORAL | Status: DC

## 2014-07-08 MED ORDER — SODIUM CHLORIDE 0.9 % IJ SOLN
3.0000 mL | Freq: Two times a day (BID) | INTRAMUSCULAR | Status: DC
  Administered 2014-07-09: 3 mL via INTRAVENOUS

## 2014-07-08 MED ORDER — CARVEDILOL 6.25 MG PO TABS
6.2500 mg | ORAL_TABLET | Freq: Two times a day (BID) | ORAL | Status: DC
  Administered 2014-07-08 – 2014-07-09 (×2): 6.25 mg via ORAL
  Filled 2014-07-08 (×5): qty 1

## 2014-07-08 MED ORDER — HEPARIN (PORCINE) IN NACL 2-0.9 UNIT/ML-% IJ SOLN
INTRAMUSCULAR | Status: AC
  Filled 2014-07-08: qty 500

## 2014-07-08 MED ORDER — SODIUM CHLORIDE 0.9 % IJ SOLN: 3.0000 mL | Freq: Two times a day (BID) | INTRAMUSCULAR | Status: DC

## 2014-07-08 MED ORDER — ALLOPURINOL 100 MG PO TABS
100.0000 mg | ORAL_TABLET | Freq: Two times a day (BID) | ORAL | Status: DC
  Administered 2014-07-08 – 2014-07-09 (×3): 100 mg via ORAL
  Filled 2014-07-08 (×4): qty 1

## 2014-07-08 MED ORDER — FENTANYL CITRATE 0.05 MG/ML IJ SOLN
INTRAMUSCULAR | Status: AC
  Filled 2014-07-08: qty 2

## 2014-07-08 MED ORDER — ACETAMINOPHEN 325 MG PO TABS: 325.0000 mg | ORAL_TABLET | ORAL | Status: DC | PRN

## 2014-07-08 MED ORDER — SODIUM CHLORIDE 0.9 % IJ SOLN: 3.0000 mL | INTRAMUSCULAR | Status: DC | PRN

## 2014-07-08 MED ORDER — IRBESARTAN 75 MG PO TABS
75.0000 mg | ORAL_TABLET | Freq: Every day | ORAL | Status: DC
  Administered 2014-07-09: 75 mg via ORAL
  Filled 2014-07-08: qty 1

## 2014-07-08 MED ORDER — SODIUM CHLORIDE 0.9 % IV SOLN
INTRAVENOUS | Status: DC
  Administered 2014-07-08: 09:00:00 via INTRAVENOUS

## 2014-07-08 MED ORDER — ONDANSETRON HCL 4 MG/2ML IJ SOLN
4.0000 mg | Freq: Four times a day (QID) | INTRAMUSCULAR | Status: DC | PRN
  Administered 2014-07-08: 4 mg via INTRAVENOUS
  Filled 2014-07-08: qty 2

## 2014-07-08 MED ORDER — CHLORHEXIDINE GLUCONATE 4 % EX LIQD
60.0000 mL | Freq: Once | CUTANEOUS | Status: DC
  Filled 2014-07-08: qty 60

## 2014-07-08 MED ORDER — MUPIROCIN 2 % EX OINT
1.0000 "application " | TOPICAL_OINTMENT | Freq: Once | CUTANEOUS | Status: AC
  Administered 2014-07-08: 1 via TOPICAL
  Filled 2014-07-08: qty 22

## 2014-07-08 MED ORDER — CEFAZOLIN SODIUM 1-5 GM-% IV SOLN
1.0000 g | Freq: Four times a day (QID) | INTRAVENOUS | Status: AC
  Administered 2014-07-08 – 2014-07-09 (×3): 1 g via INTRAVENOUS
  Filled 2014-07-08 (×3): qty 50

## 2014-07-08 MED ORDER — SPIRONOLACTONE 12.5 MG HALF TABLET
12.5000 mg | ORAL_TABLET | Freq: Every day | ORAL | Status: DC
  Administered 2014-07-09: 12.5 mg via ORAL
  Filled 2014-07-08: qty 1

## 2014-07-08 MED ORDER — PANTOPRAZOLE SODIUM 40 MG PO TBEC
40.0000 mg | DELAYED_RELEASE_TABLET | Freq: Every day | ORAL | Status: DC
  Administered 2014-07-09: 40 mg via ORAL
  Filled 2014-07-08: qty 1

## 2014-07-08 MED ORDER — MIDAZOLAM HCL 5 MG/5ML IJ SOLN
INTRAMUSCULAR | Status: AC
  Filled 2014-07-08: qty 5

## 2014-07-08 MED ORDER — MUPIROCIN 2 % EX OINT
TOPICAL_OINTMENT | CUTANEOUS | Status: AC
  Administered 2014-07-08: 1 via TOPICAL
  Filled 2014-07-08: qty 22

## 2014-07-08 MED ORDER — LEVOTHYROXINE SODIUM 125 MCG PO TABS
125.0000 ug | ORAL_TABLET | Freq: Every day | ORAL | Status: DC
  Administered 2014-07-09: 125 ug via ORAL
  Filled 2014-07-08 (×3): qty 1

## 2014-07-08 MED ORDER — SODIUM CHLORIDE 0.9 % IV SOLN: 250.0000 mL | INTRAVENOUS | Status: DC

## 2014-07-08 NOTE — Discharge Instructions (Signed)
° ° °  Supplemental Discharge Instructions for  Pacemaker/Defibrillator Patients  Activity No heavy lifting or vigorous activity with your left/right arm for 6 to 8 weeks.  Do not raise your left/right arm above your head for one week.  Gradually raise your affected arm as drawn below.           _            07-12-14                  07-13-14                   07-14-14                    3-15-16_  NO DRIVING for  1 week   ; you may begin driving on  3-87-56   .  WOUND CARE - Keep the wound area clean and dry.  Do not get this area wet for one week. No showers for one week; you may shower on 07-15-14    . - The tape/steri-strips on your wound will fall off; do not pull them off.  No bandage is needed on the site.  DO  NOT apply any creams, oils, or ointments to the wound area. - If you notice any drainage or discharge from the wound, any swelling or bruising at the site, or you develop a fever > 101? F after you are discharged home, call the office at once.  Special Instructions - You are still able to use cellular telephones; use the ear opposite the side where you have your pacemaker/defibrillator.  Avoid carrying your cellular phone near your device. - When traveling through airports, show security personnel your identification card to avoid being screened in the metal detectors.  Ask the security personnel to use the hand wand. - Avoid arc welding equipment, MRI testing (magnetic resonance imaging), TENS units (transcutaneous nerve stimulators).  Call the office for questions about other devices. - Avoid electrical appliances that are in poor condition or are not properly grounded. - Microwave ovens are safe to be near or to operate.  Additional information for defibrillator patients should your device go off: - If your device goes off ONCE and you feel fine afterward, notify the device clinic nurses. - If your device goes off ONCE and you do not feel well afterward, call 911. - If your  device goes off TWICE, call 911. - If your device goes off THREE times in one day, call 911.  DO NOT DRIVE YOURSELF OR A FAMILY MEMBER WITH A DEFIBRILLATOR TO THE HOSPITAL--CALL 911.

## 2014-07-08 NOTE — Discharge Summary (Signed)
ELECTROPHYSIOLOGY PROCEDURE DISCHARGE SUMMARY    Patient ID: Sylvia Moore,  MRN: [id number], DOB/AGE: 64-Dec-1952 64 y.o.  Admit date: 07/08/2014 Discharge date: 07/09/2014  Primary Care Physician: Kandice Hams, MD Primary Cardiologist: Irish Lack Electrophysiologist: Santonio Speakman  Primary Discharge Diagnosis:  Non ischemic cardiomyopathy, LBBB, congestive heart failure status post CRTD implant this admission  Secondary Discharge Diagnosis:  1.  Hodgins lymphoma s/p chemo and radiation 2.  HOH 3.  Depression 4.  Hypothyroidism 5.  Hypertension  Allergies  Allergen Reactions  . Codeine Nausea And Vomiting     Procedures This Admission:  1.  Implantation of a STJ CRTD on 07-08-14 by Dr Rayann Heman.  The patient received a STJ model number Quadra Assura ICD with model number 2088 right atrial lead, 7122 right ventricular lead, and 1458 left ventricular lead.  DFT's were deferred at time of implant.  There were no immediate post procedure complications. 2.  CXR on 07-09-14 will be reviewed prior to discharge.  Brief HPI: Sylvia Moore is a 64 y.o. female was referred to electrophysiology for consideration of CRTD implantation.  Past medical history includes non ischemic cardiomyopathy, congestive heart failure, and LBBB.  The patient has persistent LV dysfunction despite guideline directed therapy.  Risks, benefits, and alternatives to ICD implantation were reviewed with the patient who wished to proceed.   Hospital Course:  The patient was admitted and underwent implantation of a STJ CRTD with details as outlined above. She was monitored on telemetry overnight which demonstrated AV pacing.  Left chest was without hematoma or ecchymosis.  The device was interrogated and found to be functioning normally.  CXR will be reviewed prior to discharge.  Wound care, arm mobility, and restrictions were reviewed with the patient.  The patient was examined and considered stable for discharge to home.    The patient's discharge medications include an ARB (Valsaratan) and beta blocker (Carvedilol).   Physical Exam: Filed Vitals:   07/08/14 1500 07/08/14 1603 07/08/14 2017 07/09/14 0538  BP: 109/62 110/68 105/67 97/62  Pulse: 73 73 91 95  Temp:   98.3 F (36.8 C) 98.3 F (36.8 C)  TempSrc:   Oral Oral  Resp: 18 18 19 17   Height:      Weight:      SpO2: 100% 100% 95% 94%    GEN- The patient is well appearing, alert and oriented x 3 today.   HEENT: normocephalic, atraumatic; sclera clear, conjunctiva pink; hearing intact; oropharynx clear; neck supple, no JVP Lymph- no cervical lymphadenopathy Lungs- Clear to ausculation bilaterally, normal work of breathing.  No wheezes, rales, rhonchi Heart- Regular rate and rhythm, no murmurs, rubs or gallops  GI- soft, non-tender, non-distended, bowel sounds present, no hepatosplenomegaly Extremities- no clubbing, cyanosis, or edema; DP/PT/radial pulses 2+ bilaterally MS- no significant deformity or atrophy Skin- warm and dry, no rash or lesion, left chest without hematoma/ecchymosis Psych- euthymic mood, full affect Neuro- strength and sensation are intact   Labs:   Lab Results  Component Value Date   WBC 9.1 07/04/2014   HGB 13.9 07/04/2014   HCT 40.4 07/04/2014   MCV 98.8 07/04/2014   PLT 398.0 07/04/2014     Recent Labs Lab 07/04/14 0910  NA 133*  K 4.7  CL 100  CO2 31  BUN 24*  CREATININE 1.03  CALCIUM 10.7*  GLUCOSE 98    Discharge Medications:    Medication List    TAKE these medications        allopurinol 100  MG tablet  Commonly known as:  ZYLOPRIM  Take 100 mg by mouth 2 (two) times daily.     carvedilol 12.5 MG tablet  Commonly known as:  COREG  Take 0.5 tablets (6.25 mg total) by mouth 2 (two) times daily with a meal.     furosemide 20 MG tablet  Commonly known as:  LASIX  Take 1 tablet (20 mg total) by mouth daily.     levothyroxine 125 MCG tablet  Commonly known as:  SYNTHROID, LEVOTHROID   Take 125 mcg by mouth daily.     MULTIVITAMIN PO  Take 1 tablet by mouth daily.     nitroGLYCERIN 0.4 MG SL tablet  Commonly known as:  NITROSTAT  Place 1 tablet (0.4 mg total) under the tongue every 5 (five) minutes as needed for chest pain.     omeprazole 20 MG tablet  Commonly known as:  PRILOSEC OTC  Take 20 mg by mouth daily.     spironolactone 25 MG tablet  Commonly known as:  ALDACTONE  Take 0.5 tablets (12.5 mg total) by mouth daily.     valsartan 80 MG tablet  Commonly known as:  DIOVAN  TAKE 1 TABLET ONCE DAILY.     venlafaxine XR 37.5 MG 24 hr capsule  Commonly known as:  EFFEXOR-XR  Take 37.5 mg by mouth daily with breakfast.        Disposition:   Follow-up Information    Follow up with [facility] On 07/17/2014.   Why:  at 10:30AM   Contact information:   [place] [address]       Follow up with Patsey Berthold, NP On 08/25/2014.   Specialty:  Nurse Practitioner   Why:  at 9:30AM   Contact information:   [place] [address] [phone]       Follow up with Thompson Grayer, MD On 10/08/2014.   Specialty:  Cardiology   Why:  at 11:30AM   Contact information:   [place] [address] [place] [address] [phone]       Follow up with [name]., MD.   Specialty:  Interventional Cardiology   Why:  in August - you will receive letter 2 months in advance to schedule appointment   Contact information:   [address] [place] [address] [phone]       Duration of Discharge Encounter: Greater than 30 minutes including physician time.  Signed, Chanetta Marshall, NP 07/09/2014 7:03 AM     Thompson Grayer MD

## 2014-07-08 NOTE — H&P (View-Only) (Signed)
ELECTROPHYSIOLOGY CONSULT NOTE    Patient ID: Sylvia Moore MRN: [id number], DOB/AGE: [birth year] 64 y.o.  Admit date: 06/18/2014 Date of Consult: 06/20/2014  Primary Physician: Kandice Hams, MD Primary Cardiologist: Irish Lack  Reason for Consultation: non ischemic cardiomyopathy, LBB  HPI:  Sylvia Moore is a 64 y.o. 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  She was admitted on 06/18/14 with heart failure and has been diuresed.  Medication titration is limited by hypotension.  EP has been asked to evaluate for consideration of CRT.   Echocardiogram 06/19/14 demonstrated EF 42-59%, grade 2 diastolic dysfunction, moderate MR, moderate TR, LA 34.  Echo 2012 demonstrated EF 40-45%, Myoview 2014 demonstrated no ischemia and EF 35%.   Lab work this admission is significant for creat 1.49 on admission (1.38 today), negative enzymes, WBC 12.3, Hgb 11.7, TSH 6.101.  She reports significant improvement in chest tightness and shortness of breath with diuresis.  She thinks she is close to baseline.  She did have some recurrent chest tightness this morning but states it was during an emotional time.  No exertional chest discomfort.  She denies palpitations.  She has occasional orthostatic intolerance.  No pre-syncope or syncope.  No recent fevers, chills, nausea, vomiting.  ROS is otherwise negative.   Past Medical History  Diagnosis Date  . Chronic systolic heart failure   . Depression   . Hypothyroidism   . Gout   . HOH (hard of hearing)     has hearing aids  . Orthostatic hypotension   . Schwannoma of spinal cord     T3-T4  . Orthostatic hypotension        . NICM (nonischemic cardiomyopathy)     a. 2/2 radiation and chemotherapy b. echo 2/16  EF 20-25% c. non ischemic myoview 5/14  . Hodgkin's lymphoma     a. s/p rad'  and chemo  in 1975-1977 b. s/p splenectomy 1970  . GERD (gastroesophageal reflux disease)      Surgical History:  Past Surgical History  Procedure Laterality Date  . Hysteroscopy w/d&c    . Colonoscopy w/ polypectomy    . Lumbar fusion  07/2011    T11-L1 posterior lateral  . Dilation and curettage of uterus    . Abdominal exploration surgery  1975    r/t hodgkins disease - had chemo and rad  . Left knee surgery    . Colposcopy N/A 08/14/2012    Procedure: COLPOSCOPY;  Surgeon: [name], MD;  Location: [facility];  Service: Gynecology;  Laterality: N/A;  Colpo of Vulva and Vagina  . Lesion removal N/A 08/14/2012    Procedure: EXCISION VAGINAL LESION;  Surgeon: [name], MD;  Location: [facility];  Service: Gynecology;  Laterality: N/A;  Wide Local Excision of Right Labia Majori  . Rectal biopsy N/A 08/14/2012    Procedure: BIOPSY RECTAL;  Surgeon: [name], MD;  Location: [facility];  Service: Gynecology;  Laterality: N/A;  peri-anal biopsy  . Cholecystectomy  10 yrs ago  . Esophagogastroduodenoscopy (egd) with propofol N/A 01/20/2014    Procedure: ESOPHAGOGASTRODUODENOSCOPY (EGD) WITH PROPOFOL;  Surgeon: [name], MD;  Location: [facility];  Service: Endoscopy;  Laterality: N/A;  . Colonoscopy with propofol N/A 01/20/2014    Procedure: COLONOSCOPY WITH PROPOFOL;  Surgeon: [name], MD;  Location: [facility];  Service: Endoscopy;  Laterality: N/A;  . Knee arthroscopy Right  2015     Prescriptions prior to admission  Medication Sig Dispense Refill Last Dose  . allopurinol (ZYLOPRIM) 100 MG tablet Take 100 mg by mouth 2 (two) times daily.   06/17/2014 at Unknown time  . carvedilol (COREG) 12.5 MG tablet TAKE 1 TABLET TWICE DAILY WITH FOOD. 60 tablet 6 06/17/2014 at 2100  . levothyroxine (SYNTHROID, LEVOTHROID) 125 MCG tablet Take 125 mcg by mouth daily.   06/17/2014 at Unknown time  . Multiple Vitamins-Minerals (MULTIVITAMIN PO) Take 1 tablet by mouth daily.   06/17/2014  at Unknown time  . omeprazole (PRILOSEC OTC) 20 MG tablet Take 20 mg by mouth daily.   06/17/2014 at Unknown time  . spironolactone (ALDACTONE) 25 MG tablet TAKE 1 TABLET ONCE DAILY. 30 tablet 6 06/17/2014 at Unknown time  . valsartan (DIOVAN) 80 MG tablet Take 80 mg by mouth at bedtime.   Past Week at Unknown time  . venlafaxine XR (EFFEXOR-XR) 37.5 MG 24 hr capsule Take 37.5 mg by mouth daily with breakfast.   06/18/2014 at Unknown time  . ibuprofen (ADVIL,MOTRIN) 200 MG tablet Take 400 mg by mouth every 6 (six) hours as needed for mild pain or moderate pain.   Unk    Inpatient Medications:  . allopurinol  100 mg Oral BID  . aspirin EC  81 mg Oral Daily  . atorvastatin  40 mg Oral q1800  . carvedilol  6.25 mg Oral BID WC  . furosemide  20 mg Oral Daily  . heparin  5,000 Units Subcutaneous 3 times per day  . levothyroxine  125 mcg Oral QAC breakfast  . pantoprazole  40 mg Oral Daily  . spironolactone  12.5 mg Oral Daily  . venlafaxine XR  37.5 mg Oral Q breakfast    Allergies:  Allergies  Allergen Reactions  . Codeine Nausea And Vomiting    History   Social History  . Marital Status: Single    Spouse Name: N/A  . Number of Children: N/A  . Years of Education: N/A   Occupational History  . Not on file.   Social History Main Topics  . Smoking status: Former Smoker -- 0.25 packs/day for 1 years    Types: Cigarettes    Quit date: 05/02/1968  . Smokeless tobacco: Never Used  . Alcohol Use: Yes     Comment: weekends  . Drug Use: Yes    Special: Marijuana     Comment: for 8 yrs - quit 28 yrs ago  . Sexual Activity: Not on file   Other Topics Concern  . Not on file   Social History Narrative     Family History  Problem Relation Age of Onset  . Hypertension Father   . Stroke Father   . Heart failure Father   . Pneumonia Mother   . Healthy Sister   . Healthy Brother     BP 100/64 mmHg  Pulse 92  Temp(Src) 98.4 F (36.9 C) (Oral)  Resp 16  Ht 5\' 6"  (1.676 m)   Wt 212 lb 9.6 oz (96.435 kg)  BMI 34.33 kg/m2  SpO2 96%  Physical Exam: Filed Vitals:   06/19/14 2120 06/20/14 0214 06/20/14 0613 06/20/14 0930  BP: 106/59 92/55 93/55  100/64  Pulse: 88 89 88 92  Temp: 98 F (36.7 C)  98 F (36.7 C) 98.4 F (36.9 C)  TempSrc: Oral  Oral Oral  Resp: 17 18 16 16   Height:      Weight:   212 lb 9.6 oz (96.435  kg)   SpO2: 95% 92% 93% 96%    GEN- The patient is well appearing, alert and oriented x 3 today.   Head- normocephalic, atraumatic Eyes-  Sclera clear, conjunctiva pink Ears- hearing intact Oropharynx- clear Neck- supple, Lungs- Clear to ausculation bilaterally, normal work of breathing Heart- Regular rate and rhythm, no murmurs, rubs or gallops, PMI not laterally displaced GI- soft, NT, ND, + BS Extremities- no clubbing, cyanosis, or edema MS- no significant deformity or atrophy Skin- no rash or lesion Psych- euthymic mood, full affect Neuro- strength and sensation are intact     Labs:   Lab Results  Component Value Date   WBC 12.3* 06/19/2014   HGB 11.7* 06/19/2014   HCT 34.8* 06/19/2014   MCV 100.0 06/19/2014   PLT 334 06/19/2014    Recent Labs Lab 06/18/14 1352  06/20/14 0555  NA  --   < > 136  K  --   < > 3.9  CL  --   < > 101  CO2  --   < > 29  BUN  --   < > 25*  CREATININE  --   < > 1.38*  CALCIUM  --   < > 9.6  PROT 9.0*  --   --   BILITOT 1.4*  --   --   ALKPHOS 106  --   --   ALT 29  --   --   AST 35  --   --   GLUCOSE  --   < > 91  < > = values in this interval not displayed.   Radiology/Studies: Dg Chest 2 View 06/17/2014   CLINICAL DATA:  There is no evidence of pneumonia nor other acute cardiopulmonary abnormality.  EXAM: CHEST  2 VIEW  COMPARISON:  PA and lateral chest x-ray of July 10, 2013  FINDINGS: The lungs are slightly less well inflated today than on the previous study. There is no focal infiltrate. There is stable linear scarring in the retrosternal region. There is no pleural effusion. The  cardiac silhouette is top-normal in size. The pulmonary vascularity is not engorged. The mediastinum is normal in width. There is metallic hardware from lower thoracic and upper lumbar posterior fusion.  IMPRESSION: Borderline enlargement of cardiac silhouette without evidence of pulmonary edema. No active cardiopulmonary disease.   Electronically Signed   By: David  Martinique   On: 06/17/2014 08:41   Ct Angio Chest Pe W/cm &/or Wo Cm 06/17/2014   CLINICAL DATA:  Short of breath. History Hodgkin's lymphoma 1975. Chemo and radiation.  EXAM: CT ANGIOGRAPHY CHEST WITH CONTRAST  TECHNIQUE: Multidetector CT imaging of the chest was performed using the standard protocol during bolus administration of intravenous contrast. Multiplanar CT image reconstructions and MIPs were obtained to evaluate the vascular anatomy.  CONTRAST:  132mL OMNIPAQUE IOHEXOL 350 MG/ML SOLN  COMPARISON:  CT chest 07/11/2013  FINDINGS: Negative for pulmonary embolism. Mild cardiac enlargement. Negative for pericardial effusion. Mild atherosclerotic aortic arch without aneurysm. Lack of adequate opacification precludes the ability to evaluate for aortic dissection.  Subpleural mass in the left posterior apex is unchanged from the prior study. This measures approximately 16 x 34 mm and has homogeneous density and does not appear to enhance. This extends toward the T3-4 foramen which is not enlarged. No bony destruction or remodeling is present. This is unchanged from a CT of 08/06/2012 and appears stable. This may represent a nerve sheath tumor of an intercostal nerve or possibly a cyst such  is a lateral meningocele. Density of the mass is similar to that of water.  Scarring in the medial apex bilaterally is stable and consistent with prior radiation treatment. Negative for pneumonia or effusion. No adenopathy in the mediastinum.  Review of the MIP images confirms the above findings.  IMPRESSION: Negative for pulmonary embolism.  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 No new findings.   Electronically Signed   By: Franchot Gallo M.D.   On: 06/17/2014 13:22    UXL:KGMWN rhythm, LBBB, QRS 134  TELEMETRY: sinus rhythm, occasional PVC's  A/P 1. Nonischemic CM (EF 20%), NYHA Class III CHF The patient has a nonischemic CM (EF 20%), NYHA Class III CHF, and LBBB. At this time, she meets SCD-HeFT criteria for ICD implantation for primary prevention of sudden death. Given LBBB QRS >130 msec she also may benefit from CRT (Class IIa indiation).  Risks, benefits, alternatives to BiV ICD implantation were discussed in detail with the patient today. The patient  understands that the risks include but are not limited to bleeding, infection, pneumothorax, perforation, tamponade, vascular damage, renal failure, MI, stroke, death, inappropriate shocks, and lead dislodgement and wishes to proceed.  To reduce risks of infection and other procedural complications, I would advise that she is discharged and return for the procedure rather than proceed this admission. I have therefore scheduled her for the procedure on March 1st as second case.  She will need to come to [facility] short stay for this procedure at 9am. She will continue diuresis while here and will likely be discharged over the weekend.  Electrophysiology team to see as needed while here. Please call with questions.

## 2014-07-08 NOTE — Op Note (Signed)
SURGEON:  Thompson Grayer, MD      PREPROCEDURE DIAGNOSES:   1. Nonischemic cardiomyopathy.   2. New York Heart Association class III, heart failure chronically.   3. Left bundle-branch block.      POSTPROCEDURE DIAGNOSES:   1. Nonischemic cardiomyopathy.   2. New York Heart Association class III heart failure chronically.   3. Left bundle-branch block.      PROCEDURES:    1.  Biventricular ICD implantation.     INTRODUCTION:  Sylvia Moore is a 64 y.o. female with a nonischemic CM (EF 20%), NYHA Class III CHF, and LBBB QRS morophology. At this time, she meets MADIT II/ SCD-HeFT criteria for ICD implantation for primary prevention of sudden death.  Given LBBB, the patient may also be expected to benefit from resynchronization therapy. The patient has been treated with an optimal medical regimen but continues to have a depressed ejection fraction and NYHA Class III CHF symptoms.  she therefore  presents today for a biventricular ICD implantation.      DESCRIPTION OF PROCEDURE:  Informed written consent was obtained and the patient was brought to the electrophysiology lab in the fasting state. The patient was adequately sedated with intravenous Versed, and fentanyl as outlined in the nursing report.  The patient's left chest was prepped and draped in the usual sterile fashion by the EP lab staff.  The skin overlying the left deltopectoral region was infiltrated with lidocaine for local analgesia.  A 5-cm incision was made over the left deltopectoral region.  A left subcutaneous defibrillator pocket was fashioned using a combination of sharp and blunt dissection.  Electrocautery was used to assure hemostasis.   RA/RV Lead Placement: The left axillary vein was cannulated with fluoroscopic visualization.  No contrast was required for this endeavor.  Through the left axillary vein, a St. Jude Medical Tendril STS, model 2088TC-52  (serial # [id number]) right atrial lead and a St. Jude Medical [place],  model 7122Q-65 (serial number [id number]) right ventricular defibrillator lead were advanced with fluoroscopic visualization into the right atrial appendage and right ventricular apex positions respectively.  Initial atrial lead P-waves measured 2.7 mV with an impedance of 622 ohms and a threshold of 1.2 volts at 0.5 milliseconds.  The right ventricular lead R-wave measured 21 mV with impedance of 760 ohms and a threshold of 0.5 volts at 0.5 milliseconds.   LV Lead Placement: A Medtronic MB-2 guide was advanced through the left axillary vein into the low lateral right atrium.  A Bard curved Damato catheter was introduced through the MB-2 guide and used to cannulate the coronary sinus.  Coronary sinus cannulation was confirmed with electrogram recording from the hexapolar catheter.   A selective coronary sinus venogram was performed by hand injection of nonionic contrast.  This demonstrated a moderate sized lateral branch.  A Whisper CSJ wire was introduced through the guide and advanced into the lateral branch.  [place] [id number] (serial number [id number]) lead was advanced through the MB-2 into the lateral branch.   This was  approximately one-thirds from the base to the apex in a very lateral position.  In this location, the left ventricular lead R-waves measured  31 mV with impedance of 1471 ohms and a threshold of 1 volt at 0.5  milliseconds in the M3-D4 bipolar configuration with no diaphragmatic stimulation observed when pacing at 10 volts output.  The MB-2 guide was  therefore removed.     All three leads were  secured to the pectoralis  fascia using #2 silk suture over the suture sleeves.  The pocket then  irrigated with copious gentamicin solution.  The leads were then  connected to a [place] CD [place] (serial  Number [id number]) biventricular ICD.  The defibrillator was placed into the  pocket.  The pocket was then closed in 2 layers with 2.0  Vicryl suture  for the subcutaneous and subcuticular layers. EBL<66ml.  Steri-Strips and a  sterile dressing were then applied.   Defibrillation Threshold testing was not performed today.   There were no early apparent complications.     CONCLUSIONS:   1. Nonischemic cardiomyopathy with Left bundle-branch block and chronic New York Heart Association class III heart failure.   2. Successful biventricular ICD implantation.   3. DFT not performed.   4. No early apparent complications.    Thompson Grayer MD 07/08/2014 1:15 PM

## 2014-07-08 NOTE — Interval H&P Note (Signed)
History and Physical Interval Note:  07/08/2014 10:45 AM  Sylvia Moore  has presented today for surgery, with the diagnosis of cm  The various methods of treatment have been discussed with the patient and family. After consideration of risks, benefits and other options for treatment, the patient has consented to  Procedure(s): BI-VENTRICULAR IMPLANTABLE CARDIOVERTER DEFIBRILLATOR  (CRT-D) (N/A) as a surgical intervention .  The patient's history has been reviewed, patient examined, no change in status, stable for surgery.  I have reviewed the patient's chart and labs.  Questions were answered to the patient's satisfaction.     ICD Criteria  Current LVEF:20% ;Obtained < 1 month ago.  NYHA Functional Classification: Class III  Heart Failure History:  Yes, Duration of heart failure since onset is > 9 months  Non-Ischemic Dilated Cardiomyopathy History:  Yes, timeframe is > 9 months  Atrial Fibrillation/Atrial Flutter:  No.  Ventricular Tachycardia History:  No.  Cardiac Arrest History:  No  History of Syndromes with Risk of Sudden Death:  No.  Previous ICD:  No.  Electrophysiology Study: No.  Prior MI: No.  PPM: No.  OSA:  No  Patient Life Expectancy of >=1 year: Yes.  Anticoagulation Therapy:  Patient is NOT on anticoagulation therapy.   Beta Blocker Therapy:  Yes.   Ace Inhibitor/ARB Therapy:  Yes.   Thompson Grayer

## 2014-07-09 ENCOUNTER — Ambulatory Visit (HOSPITAL_COMMUNITY): Payer: BC Managed Care – PPO

## 2014-07-09 ENCOUNTER — Other Ambulatory Visit: Payer: Self-pay

## 2014-07-09 DIAGNOSIS — I429 Cardiomyopathy, unspecified: Secondary | ICD-10-CM

## 2014-07-09 DIAGNOSIS — I447 Left bundle-branch block, unspecified: Secondary | ICD-10-CM

## 2014-07-09 DIAGNOSIS — I420 Dilated cardiomyopathy: Secondary | ICD-10-CM

## 2014-07-09 DIAGNOSIS — Z8571 Personal history of Hodgkin lymphoma: Secondary | ICD-10-CM | POA: Diagnosis not present

## 2014-07-09 DIAGNOSIS — I951 Orthostatic hypotension: Secondary | ICD-10-CM | POA: Diagnosis not present

## 2014-07-11 ENCOUNTER — Other Ambulatory Visit: Payer: BC Managed Care – PPO

## 2014-07-15 ENCOUNTER — Other Ambulatory Visit: Payer: BC Managed Care – PPO

## 2014-07-17 ENCOUNTER — Ambulatory Visit: Payer: BC Managed Care – PPO

## 2014-07-18 ENCOUNTER — Ambulatory Visit (INDEPENDENT_AMBULATORY_CARE_PROVIDER_SITE_OTHER): Payer: BC Managed Care – PPO | Admitting: *Deleted

## 2014-07-18 DIAGNOSIS — I428 Other cardiomyopathies: Secondary | ICD-10-CM

## 2014-07-18 DIAGNOSIS — I447 Left bundle-branch block, unspecified: Secondary | ICD-10-CM

## 2014-07-18 DIAGNOSIS — I420 Dilated cardiomyopathy: Secondary | ICD-10-CM | POA: Diagnosis not present

## 2014-07-18 DIAGNOSIS — I429 Cardiomyopathy, unspecified: Secondary | ICD-10-CM | POA: Diagnosis not present

## 2014-07-18 LAB — MDC_IDC_ENUM_SESS_TYPE_INCLINIC
Battery Remaining Longevity: 76.8 mo
Brady Statistic RV Percent Paced: 99.46 %
HighPow Impedance: 57.375
Implantable Pulse Generator Serial Number: 7204299
Lead Channel Impedance Value: 450 Ohm
Lead Channel Impedance Value: 700 Ohm
Lead Channel Pacing Threshold Amplitude: 0.5 V
Lead Channel Pacing Threshold Amplitude: 2 V
Lead Channel Pacing Threshold Pulse Width: 0.5 ms
Lead Channel Pacing Threshold Pulse Width: 0.5 ms
Lead Channel Pacing Threshold Pulse Width: 0.7 ms
Lead Channel Sensing Intrinsic Amplitude: 2.1 mV
Lead Channel Setting Pacing Amplitude: 1.75 V
Lead Channel Setting Pacing Amplitude: 2 V
Lead Channel Setting Pacing Amplitude: 3 V
Lead Channel Setting Pacing Pulse Width: 0.5 ms
Lead Channel Setting Pacing Pulse Width: 0.7 ms
Lead Channel Setting Sensing Sensitivity: 0.5 mV
MDC IDC MSMT LEADCHNL RA PACING THRESHOLD AMPLITUDE: 0.75 V
MDC IDC MSMT LEADCHNL RV IMPEDANCE VALUE: 600 Ohm
MDC IDC MSMT LEADCHNL RV SENSING INTR AMPL: 12 mV
MDC IDC SESS DTM: 20160318114525
MDC IDC SET ZONE DETECTION INTERVAL: 250 ms
MDC IDC SET ZONE DETECTION INTERVAL: 280 ms
MDC IDC STAT BRADY RA PERCENT PACED: 0.03 %
Zone Setting Detection Interval: 340 ms

## 2014-07-18 NOTE — Progress Notes (Signed)
Pt seen in clinic for follow up of ICD and wound check.  Wound well healed without redness, swelling or drainage.  No complaints of chest pain, shortness of breath, dizziness, palpitations, or shocks.  Device functioning normally at this time.  For full details, see PaceArt report.  No programming changes made today.  Plan to follow up with Chanetta Marshall, NP 08/25/14 and Greggory Brandy 10/16/14.  Ranee Gosselin, RN, BSN 07/18/2014 10:56 AM

## 2014-07-22 ENCOUNTER — Telehealth: Payer: Self-pay | Admitting: Internal Medicine

## 2014-07-22 NOTE — Telephone Encounter (Signed)
Spoke with Sylvia Moore @ Dr. Vicente Serene' office. It is recommended for patient to NOT have dental work for at least 6-8 weeks following her 07/08/14 surgery/procedure of BI-VENTRICULAR IMPLANTABLE CARDIOVERTER DEFIBRILLATOR (CRT-D).  Patient can discuss use of ABX for dental work during her device check/CRT-D follow up on April 25th with provider.

## 2014-07-22 NOTE — Telephone Encounter (Signed)
New Message    1. What dental office are you calling from? [facility]   What is your office phone and fax number? [phone], Fax [phone] 2. What type of procedure is the patient having performed?clean, spot bleeding will occur  3. What date is procedure scheduled? 07/22/14   4. What is your question (ex. Antibiotics prior to procedure, holding medication-we need to know how long dentist wants pt to hold med)? Does she need to be premedicated.

## 2014-08-05 ENCOUNTER — Encounter: Payer: Self-pay | Admitting: Internal Medicine

## 2014-08-20 ENCOUNTER — Telehealth: Payer: Self-pay | Admitting: Interventional Cardiology

## 2014-08-20 NOTE — Telephone Encounter (Signed)
Spoke with pt and she states that the last two mornings she has woken up with some dizziness. Pt's BP this morning was 84/51, no pulse available. Pt states that she also has a dull headache. Pt went out to work in the yard but had to come back inside. Pt states that she did take all of her morning medications this morning. No other sx noted. Informed pt to drink some fluids, not too much due to EF. Informed pt to prop her feet up and to call me back this afternoon to update me on how she is doing. Informed pt that in the meantime I would route this information to Dr. Irish Lack for review and advisement. Pt verbalized understanding and was in agreement with this plan.

## 2014-08-20 NOTE — Telephone Encounter (Signed)
agree

## 2014-08-20 NOTE — Telephone Encounter (Signed)
Follow up      FYI Want Anderson Malta to know----pt is feeling much better.  Bp is 107/86----no more dizziness

## 2014-08-20 NOTE — Telephone Encounter (Signed)
New message      Pt is having a lot of dizziness----bp 84/51.  This has been in the mornings for the last couple of mornings.  By afternoon, she is better.  Please advise

## 2014-08-20 NOTE — Telephone Encounter (Signed)
Spoke with pt and she states that she is feeling much better. Pt states that she took a nap with her feet propped up and was much better when she woke up. Informed pt that if these sx continue to occur daily to certainly call our office and let us know. Pt verbalized understanding and was in agreement with this plan.

## 2014-08-21 ENCOUNTER — Telehealth: Payer: Self-pay | Admitting: Interventional Cardiology

## 2014-08-21 NOTE — Telephone Encounter (Signed)
New Message   Pt calling to speak w/ Rn. Pt stated- she took all medication except Lasix this am (4/21) BP, just now is 84/55 and is feeling lightheaded. Please call back and discuss.

## 2014-08-21 NOTE — Telephone Encounter (Signed)
Spoke with pt and she states that this morning she took her meds and an hour and half later she was noted to have dizziness and her BP was 84/55.  Pt states that she did not take her Coreg last night and she didn't take her Lasix this morning but took her other medications as ordered.  Encouraged pt to elevate her legs and rest and that I would call her back after speaking with the Flex PA.  Spoke with Sylvia Jester, PA-C and she ordered for pt to stop Sprinolactone and to continue to monitor BP and symptoms.  Informed pt of these orders and to contact our office if symptoms continue and to update her condition on Monday at her f/u appt with Sylvia Marshall, NP on Monday as well. Pt verbalized understanding and was in agreement with this plan.

## 2014-08-23 ENCOUNTER — Encounter: Payer: Self-pay | Admitting: Nurse Practitioner

## 2014-08-23 NOTE — Progress Notes (Addendum)
Electrophysiology Office Note Date: 08/25/2014  ID:  Sylvia Moore, DOB 1950-07-02, MRN [id number]  PCP: Kandice Hams, MD Primary Cardiologist: Irish Lack Electrophysiologist: Allred  CC: 6 week post CRT implant follow up  Sylvia Moore is a 64 y.o. female is seen today for Dr Rayann Heman.  She presents today for routine electrophysiology followup. She underwent CRTD implantation 6 weeks ago for non-ischemic cardiomyopathy, congestive heart failure, and LBBB. She has had some problems with hypotension since discharge and her spironolactone was discontinued. She feels that her  shortness of breath is significantly improved since device implantation. She continues to have problems with hypotension but denies chest pain, palpitations, dyspnea, PND, orthopnea, nausea, vomiting, syncope, edema, weight gain, or early satiety.  She has not had ICD shocks.   Device History: STJ CRTD implanted 07/2014 for non-ischemic cardiomyopathy, LBBB History of appropriate therapy: No History of AAD therapy: No   Past Medical History  Diagnosis Date  . Chronic systolic heart failure   . Depression   . Gout        . HOH (hard of hearing)     has hearing aids  . Schwannoma of spinal cord     T3-T4  . Orthostatic hypotension        . NICM (nonischemic cardiomyopathy)     a. 2/2 radiation and chemotherapy b. echo 2/16  EF 20-25% c. non ischemic myoview 5/14 d. s/p STJ CRTD  . GERD (gastroesophageal reflux disease)   . LBBB (left bundle branch block)   . Pneumonia 1960's X 1; 2014  . Hypothyroidism     a. s/p thyroid radiation  . History of blood transfusion ~ 1976    "related to cyst on my lung"  . Hepatitis ~ 1978    "got hepatitis w/mono; don't know which kind"  . Hodgkin's lymphoma     a. s/p rad' and chemo  in 1975-1977 b. s/p splenectomy 1970's   Past Surgical History  Procedure Laterality Date  . Hysteroscopy w/d&c    . Colonoscopy w/ polypectomy    . Lumbar fusion  07/2011    T11-L1  posterior lateral  . Dilation and curettage of uterus    . Abdominal exploration surgery  1975    r/t hodgkins disease - had chemo and rad  . Knee arthroscopy Left   . Colposcopy N/A 08/14/2012    Procedure: COLPOSCOPY;  Surgeon: [name], MD;  Location: [facility];  Service: Gynecology;  Laterality: N/A;  Colpo of Vulva and Vagina  . Lesion removal N/A 08/14/2012    Procedure: EXCISION VAGINAL LESION;  Surgeon: [name], MD;  Location: [facility];  Service: Gynecology;  Laterality: N/A;  Wide Local Excision of Right Labia Majori  . Rectal biopsy N/A 08/14/2012    Procedure: BIOPSY RECTAL;  Surgeon: [name], MD;  Location: [facility];  Service: Gynecology;  Laterality: N/A;  peri-anal biopsy  . Esophagogastroduodenoscopy (egd) with propofol N/A 01/20/2014    Procedure: ESOPHAGOGASTRODUODENOSCOPY (EGD) WITH PROPOFOL;  Surgeon: [name], MD;  Location: [facility];  Service: Endoscopy;  Laterality: N/A;  . Colonoscopy with propofol N/A 01/20/2014    Procedure: COLONOSCOPY WITH PROPOFOL;  Surgeon: [name], MD;  Location: [facility];  Service: Endoscopy;  Laterality: N/A;  . Knee arthroscopy Right 2015    "torn meniscus"  . Left heart catheterization with coronary angiogram N/A 07/01/2014    Procedure: LEFT HEART CATHETERIZATION WITH CORONARY ANGIOGRAM;  Surgeon: [name], MD;  Location: [facility];  Service: Cardiovascular;  Laterality: N/A;  . Back surgery    . Laparoscopic cholecystectomy  ~ 2005  . Neck lesion biopsy Right 1975  . Splenectomy  1970's  . Bi-ventricular implantable cardioverter defibrillator N/A 07/08/2014    STJ Quadra Assura CRTD implanted by Dr Rayann Heman    Current Outpatient Prescriptions  Medication Sig Dispense Refill  . allopurinol (ZYLOPRIM) 100 MG tablet Take 100 mg by mouth 2 (two) times daily.    . carvedilol (COREG) 12.5 MG tablet Take 0.5 tablets (6.25 mg total) by mouth 2 (two) times daily with a meal. 60 tablet 6  . furosemide (LASIX)  20 MG tablet Take 1 tablet (20 mg total) by mouth every other day. 30 tablet 11  . levothyroxine (SYNTHROID, LEVOTHROID) 125 MCG tablet Take 125 mcg by mouth daily.    . Multiple Vitamins-Minerals (MULTIVITAMIN PO) Take 1 tablet by mouth daily.    . nitroGLYCERIN (NITROSTAT) 0.4 MG SL tablet Place 1 tablet (0.4 mg total) under the tongue every 5 (five) minutes as needed for chest pain. 25 tablet 3  . omeprazole (PRILOSEC OTC) 20 MG tablet Take 20 mg by mouth daily.    . valsartan (DIOVAN) 40 MG tablet Take 1 tablet (40 mg total) by mouth daily. 30 tablet 11  . venlafaxine XR (EFFEXOR-XR) 37.5 MG 24 hr capsule Take 37.5 mg by mouth daily with breakfast.     No current facility-administered medications for this visit.    Allergies:   Codeine   Social History: History   Social History  . Marital Status: Married    Spouse Name: N/A  . Number of Children: N/A  . Years of Education: N/A   Occupational History  . Not on file.   Social History Main Topics  . Smoking status: Former Smoker -- 0.25 packs/day for 1 years    Types: Cigarettes    Quit date: 05/02/1968  . Smokeless tobacco: Never Used  . Alcohol Use: 1.8 oz/week    3 Shots of liquor per week     Comment: weekends; if that  . Drug Use: Yes    Special: Marijuana     Comment: for 8 yrs - quit in 1987  . Sexual Activity: Yes    Birth Control/ Protection: Post-menopausal   Other Topics Concern  . Not on file   Social History Narrative    Family History: Family History  Problem Relation Age of Onset  . Hypertension Father   . Stroke Father   . Heart failure Father   . Pneumonia Mother   . Healthy Sister   . Healthy Brother     Review of Systems: General: No chills, fever, night sweats or weight changes  Cardiovascular:  No chest pain, dyspnea on exertion, edema, orthopnea, palpitations, paroxysmal nocturnal dyspnea; denies ICD shocks Dermatological: No rash, lesions or masses Respiratory: No cough,  dyspnea Urologic: No hematuria, dysuria Abdominal: No nausea, vomiting, diarrhea, bright red blood per rectum, melena, or hematemesis Neurologic: No visual changes, weakness, changes in mental status All other systems reviewed and are otherwise negative except as noted above.   Physical Exam: VS:  BP 100/60 mmHg  Pulse 91  Ht 5\' 6"  (1.676 m)  Wt 208 lb 6.4 oz (94.53 kg)  BMI 33.65 kg/m2 , BMI Body mass index is 33.65 kg/(m^2).  GEN- The patient is well appearing, alert and oriented x 3 today.   HEENT: normocephalic, atraumatic; sclera clear, conjunctiva pink; hearing intact; oropharynx clear; neck supple, no JVP Lymph-  no cervical lymphadenopathy Lungs- Clear to ausculation bilaterally, normal work of breathing.  No wheezes, rales, rhonchi Heart- Regular rate and rhythm, no murmurs, rubs or gallops, PMI not laterally displaced GI- soft, non-tender, non-distended, bowel sounds present, no hepatosplenomegaly Extremities- no clubbing, cyanosis, or edema; DP/PT/radial pulses 2+ bilaterally MS- no significant deformity or atrophy Skin- warm and dry, no rash or lesion; ICD pocket well healed Psych- euthymic mood, full affect Neuro- strength and sensation are intact  ICD interrogation- reviewed in detail today,  See PACEART report  EKG:  EKG is ordered today. The ekg ordered today shows sinus rhythm with V pacing   Recent Labs: 08/26/2013: Pro B Natriuretic peptide (BNP) 321.0* 06/18/2014: ALT 29; B Natriuretic Peptide 432.5*; Magnesium 1.9; TSH 6.101* 07/04/2014: BUN 24*; Creatinine 1.03; Hemoglobin 13.9; Platelets 398.0; Potassium 4.7; Sodium 133*   Wt Readings from Last 3 Encounters:  08/25/14 208 lb 6.4 oz (94.53 kg)  07/08/14 209 lb (94.802 kg)  06/27/14 210 lb (95.255 kg)     Assessment and Plan:  1.  Chronic systolic dysfunction Normal ICD function See Pace Art report No changes today Euvolemic on exam. She has had some problems with hypotension post CRTD implant.   Spironolactone has been discontinued.  Decrease Diovan to 40mg  daily today.  Decrease Lasix to every other day and monitor weights, pt aware to take daily if gains >2 pounds in 1 day or >5 pounds in 1 week Her heart rate histograms are right shifted.  Corlanor may be effective at lowering sinus rate and further improving symptoms.  Will discuss with Drs Rayann Heman and Irish Lack.  BMET, CBC today Repeat echo 6 months post device implant (01/2015)   Current medicines are reviewed at length with the patient today.   The patient does not have concerns regarding her medicines.  The following changes were made today:  Decrease Diovan to 40mg  daily, decrease Lasix to every other day  Labs/ tests ordered today include:  Orders Placed This Encounter  Procedures  . Basic Metabolic Panel (BMET)  . CBC  . EKG 12-Lead  . 2D Echocardiogram with contrast     Disposition:   Follow up with Dr Rayann Heman  6 weeks as scheduled. Follow up with Dr Irish Lack 01/2015   Signed, Chanetta Marshall, NP 08/25/2014 10:31 AM  [place] [address] [phone] (office) [phone] (fax)

## 2014-08-25 ENCOUNTER — Encounter: Payer: Self-pay | Admitting: Nurse Practitioner

## 2014-08-25 ENCOUNTER — Encounter: Payer: Self-pay | Admitting: Internal Medicine

## 2014-08-25 ENCOUNTER — Ambulatory Visit (INDEPENDENT_AMBULATORY_CARE_PROVIDER_SITE_OTHER): Payer: BC Managed Care – PPO | Admitting: Nurse Practitioner

## 2014-08-25 VITALS — BP 100/60 | HR 91 | Ht 66.0 in | Wt 208.4 lb

## 2014-08-25 DIAGNOSIS — I420 Dilated cardiomyopathy: Secondary | ICD-10-CM | POA: Diagnosis not present

## 2014-08-25 DIAGNOSIS — I5022 Chronic systolic (congestive) heart failure: Secondary | ICD-10-CM

## 2014-08-25 DIAGNOSIS — I10 Essential (primary) hypertension: Secondary | ICD-10-CM | POA: Diagnosis not present

## 2014-08-25 LAB — MDC_IDC_ENUM_SESS_TYPE_INCLINIC
Brady Statistic RA Percent Paced: 1 %
Brady Statistic RV Percent Paced: 99 %
Lead Channel Impedance Value: 450 Ohm
Lead Channel Pacing Threshold Amplitude: 1.875 V
Lead Channel Sensing Intrinsic Amplitude: 12 mV
Lead Channel Sensing Intrinsic Amplitude: 3.5 mV
Lead Channel Setting Pacing Amplitude: 2 V
Lead Channel Setting Pacing Amplitude: 3 V
Lead Channel Setting Sensing Sensitivity: 0.5 mV
MDC IDC MSMT LEADCHNL LV IMPEDANCE VALUE: 1150 Ohm
MDC IDC MSMT LEADCHNL LV PACING THRESHOLD PULSEWIDTH: 0.7 ms
MDC IDC MSMT LEADCHNL RA PACING THRESHOLD AMPLITUDE: 0.75 V
MDC IDC MSMT LEADCHNL RA PACING THRESHOLD PULSEWIDTH: 0.5 ms
MDC IDC MSMT LEADCHNL RV IMPEDANCE VALUE: 550 Ohm
MDC IDC MSMT LEADCHNL RV PACING THRESHOLD AMPLITUDE: 0.5 V
MDC IDC MSMT LEADCHNL RV PACING THRESHOLD PULSEWIDTH: 0.5 ms
MDC IDC PG SERIAL: 7204299
MDC IDC SET LEADCHNL LV PACING PULSEWIDTH: 0.7 ms
MDC IDC SET LEADCHNL RA PACING AMPLITUDE: 1.75 V
MDC IDC SET LEADCHNL RV PACING PULSEWIDTH: 0.5 ms
MDC IDC SET ZONE DETECTION INTERVAL: 280 ms
MDC IDC SET ZONE DETECTION INTERVAL: 340 ms
Zone Setting Detection Interval: 250 ms

## 2014-08-25 LAB — BASIC METABOLIC PANEL
BUN: 23 mg/dL (ref 6–23)
CALCIUM: 10.2 mg/dL (ref 8.4–10.5)
CHLORIDE: 101 meq/L (ref 96–112)
CO2: 26 mEq/L (ref 19–32)
Creatinine, Ser: 0.79 mg/dL (ref 0.40–1.20)
GFR: 77.97 mL/min (ref >60.00)
GLUCOSE: 98 mg/dL (ref 70–99)
Potassium: 4.4 mEq/L (ref 3.5–5.1)
SODIUM: 132 meq/L — AB (ref 135–145)

## 2014-08-25 LAB — CBC
HCT: 38.9 % (ref 36.0–46.0)
Hemoglobin: 13.3 g/dL (ref 12.0–15.0)
MCHC: 34.2 g/dL (ref 30.0–36.0)
MCV: 98.2 fl (ref 78.0–100.0)
Platelets: 444 10*3/uL — ABNORMAL HIGH (ref 150.0–400.0)
RBC: 3.96 Mil/uL (ref 3.87–5.11)
RDW: 14 % (ref 11.5–15.5)
WBC: 12.4 10*3/uL — ABNORMAL HIGH (ref 4.0–10.5)

## 2014-08-25 MED ORDER — VALSARTAN 40 MG PO TABS: 40.0000 mg | ORAL_TABLET | Freq: Every day | ORAL | Status: DC

## 2014-08-25 MED ORDER — FUROSEMIDE 20 MG PO TABS: 20.0000 mg | ORAL_TABLET | ORAL | Status: DC

## 2014-08-25 NOTE — Patient Instructions (Addendum)
Medication Instructions:  Your physician has recommended you make the following change in your medication:  1. DECREASE Diovan to 40mg  once a day 2. DECREASE Furosemide to 20mg  every other day--continue to monitor your weight 3. Remain off of Spironolactone  Labwork: Your physician recommends that you have lab work today: BMP and CBC   Testing/Procedures: Your physician has requested that you have an echocardiogram in September. Echocardiography is a painless test that uses sound waves to create images of your heart. It provides your doctor with information about the size and shape of your heart and how well your heart's chambers and valves are working. This procedure takes approximately one hour. There are no restrictions for this procedure.  Follow-Up: Your physician recommends that you keep your scheduled follow-up appointment with Dr Rayann Heman.  Your physician recommends that you schedule a follow-up appointment in: September with Dr Irish Lack (after Echo)  Any Other Special Instructions Will Be Listed Below (If Applicable).

## 2014-08-26 ENCOUNTER — Telehealth: Payer: Self-pay | Admitting: *Deleted

## 2014-08-26 ENCOUNTER — Telehealth: Payer: Self-pay | Admitting: Internal Medicine

## 2014-08-26 MED ORDER — IVABRADINE HCL 5 MG PO TABS: 5.0000 mg | ORAL_TABLET | Freq: Two times a day (BID) | ORAL | Status: DC

## 2014-08-26 NOTE — Telephone Encounter (Signed)
Sylvia Moore, [place] Triage           I spoke with the patient about possibly trialing Corlanor yesterday. Can you call her and let her know that Drs Rayann Heman and Irish Lack agree we should try it? Please start her on 5mg  twice daily. We will do a device download in 2 weeks to reassess heart rates.   Thank you

## 2014-08-26 NOTE — Telephone Encounter (Signed)
Spoke to Patient and she agreed to try Corlanor 5 mg twice daily, per order from Chanetta Marshall, NP, and agreement from Dr. Rayann Heman and Dr. Irish Lack.

## 2014-08-26 NOTE — Telephone Encounter (Signed)
New message      corlanor 5mg  was faxed to the pharmacy today to be filled-----this medication requires prior authorization

## 2014-08-28 ENCOUNTER — Telehealth: Payer: Self-pay | Admitting: *Deleted

## 2014-08-28 NOTE — Telephone Encounter (Signed)
S/w Rolinda Roan @ [phone] for Prior Auth on Corlanor ( 5 mg ) bid was approved for 07/29/2014 till 08/28/2015 # [id number].

## 2014-09-01 ENCOUNTER — Telehealth: Payer: Self-pay

## 2014-09-01 ENCOUNTER — Telehealth: Payer: Self-pay | Admitting: Interventional Cardiology

## 2014-09-01 NOTE — Telephone Encounter (Signed)
Received faxed approval from [facility] for Corlanor. Approval good 07/29/14 thru 08/28/2015. Case ID [id number]. Patient informed.

## 2014-09-01 NOTE — Telephone Encounter (Signed)
New message        Pt is on day 7 of doxyclcline hyclate 100mg  for an infected cyst   pt wanted you to know just in case you wanted her to do something else

## 2014-09-01 NOTE — Telephone Encounter (Signed)
LM on VM that patient's message will be forwarded to her MD.

## 2014-09-03 NOTE — Telephone Encounter (Signed)
Received a call from patient who called back for advice.  She states she is currently taking a 10 day course of doxycycline for a leg infection as prescribed by her dermatologist.  She states the infection is getting better and she is due to finish the tx on Friday 5/6.  Patient would like to know if there is anything else she should do to protect the relatively new pacemaker (placed 3/8).  I advised patient that taking care of the infection is the main concern as any systemic infection could be potentially dangerous.  Patient verbalized understanding and is in agreement to contact her dermatologist if infection does not clear at the end of the course of doxycycline.  I advised patient to call back with questions or concerns. Patient thanked me for the call.

## 2014-09-09 ENCOUNTER — Telehealth: Payer: Self-pay | Admitting: Cardiology

## 2014-09-09 ENCOUNTER — Encounter: Payer: BC Managed Care – PPO | Admitting: *Deleted

## 2014-09-09 NOTE — Telephone Encounter (Signed)
Spoke with pt and reminded pt of remote transmission that is due today. Pt verbalized understanding.   

## 2014-09-10 ENCOUNTER — Telehealth: Payer: Self-pay | Admitting: Internal Medicine

## 2014-09-10 NOTE — Telephone Encounter (Signed)
New message       What dental office are you calling from? Dr Gordy Levan 1. What is your office phone and fax number? Fax  [phone]  2. What type of procedure is the patient having performed?  Dental cleaning 3. What date is procedure scheduled?  Today at 9am  4. What is your question (ex. Antibiotics prior to procedure, holding medication-we need to know how long dentist wants pt to hold med)?  Pt has a pacemaker( since march)-----will she need an antibiotic prior to cleaning?

## 2014-09-10 NOTE — Telephone Encounter (Signed)
No antibiotic needed per Dr Rayann Heman

## 2014-09-12 ENCOUNTER — Encounter: Payer: Self-pay | Admitting: Cardiology

## 2014-09-16 ENCOUNTER — Encounter: Payer: Self-pay | Admitting: Internal Medicine

## 2014-10-08 ENCOUNTER — Encounter: Payer: BC Managed Care – PPO | Admitting: Internal Medicine

## 2014-10-16 ENCOUNTER — Encounter: Payer: Self-pay | Admitting: Internal Medicine

## 2014-10-16 ENCOUNTER — Ambulatory Visit (INDEPENDENT_AMBULATORY_CARE_PROVIDER_SITE_OTHER): Payer: BC Managed Care – PPO | Admitting: Internal Medicine

## 2014-10-16 VITALS — BP 126/82 | HR 79 | Ht 66.0 in | Wt 210.8 lb

## 2014-10-16 DIAGNOSIS — I447 Left bundle-branch block, unspecified: Secondary | ICD-10-CM | POA: Diagnosis not present

## 2014-10-16 DIAGNOSIS — I5023 Acute on chronic systolic (congestive) heart failure: Secondary | ICD-10-CM | POA: Diagnosis not present

## 2014-10-16 DIAGNOSIS — I429 Cardiomyopathy, unspecified: Secondary | ICD-10-CM | POA: Diagnosis not present

## 2014-10-16 DIAGNOSIS — I428 Other cardiomyopathies: Secondary | ICD-10-CM

## 2014-10-16 LAB — CUP PACEART INCLINIC DEVICE CHECK
Brady Statistic RV Percent Paced: 99.65 %
Date Time Interrogation Session: 20160616140122
HighPow Impedance: 61.875
Lead Channel Impedance Value: 487.5 Ohm
Lead Channel Pacing Threshold Amplitude: 1.325 V
Lead Channel Pacing Threshold Pulse Width: 0.5 ms
Lead Channel Pacing Threshold Pulse Width: 0.5 ms
Lead Channel Setting Pacing Amplitude: 2 V
Lead Channel Setting Pacing Amplitude: 2.5 V
Lead Channel Setting Pacing Pulse Width: 0.7 ms
MDC IDC MSMT BATTERY REMAINING LONGEVITY: 78 mo
MDC IDC MSMT LEADCHNL LV IMPEDANCE VALUE: 1125 Ohm
MDC IDC MSMT LEADCHNL LV PACING THRESHOLD AMPLITUDE: 1.375 V
MDC IDC MSMT LEADCHNL LV PACING THRESHOLD PULSEWIDTH: 0.7 ms
MDC IDC MSMT LEADCHNL RA IMPEDANCE VALUE: 462.5 Ohm
MDC IDC MSMT LEADCHNL RA SENSING INTR AMPL: 3 mV
MDC IDC MSMT LEADCHNL RV PACING THRESHOLD AMPLITUDE: 0.5 V
MDC IDC MSMT LEADCHNL RV SENSING INTR AMPL: 12 mV
MDC IDC SET LEADCHNL RA PACING AMPLITUDE: 2.25 V
MDC IDC SET LEADCHNL RV PACING PULSEWIDTH: 0.5 ms
MDC IDC SET LEADCHNL RV SENSING SENSITIVITY: 0.5 mV
MDC IDC STAT BRADY RA PERCENT PACED: 0.22 %
Pulse Gen Serial Number: 7204299
Zone Setting Detection Interval: 250 ms
Zone Setting Detection Interval: 280 ms
Zone Setting Detection Interval: 340 ms

## 2014-10-16 MED ORDER — IVABRADINE HCL 5 MG PO TABS: 7.5000 mg | ORAL_TABLET | Freq: Two times a day (BID) | ORAL | Status: DC

## 2014-10-16 NOTE — Patient Instructions (Signed)
Medication Instructions:  Your physician has recommended you make the following change in your medication:  1) Increase Corlanor to 7.5 mg twice daily   Labwork: None ordered  Testing/Procedures: None ordered  Follow-Up: Remote monitoring is used to monitor your Pacemaker of ICD from home. This monitoring reduces the number of office visits required to check your device to one time per year. It allows [place] to keep an eye on the functioning of your device to ensure it is working properly. You are scheduled for a device check from home on 01/15/15. You may send your transmission at any time that day. If you have a wireless device, the transmission will be sent automatically. After your physician reviews your transmission, you will receive a postcard with your next transmission date.   Your physician wants you to follow-up in: 12 months with Chanetta Marshall, NP You will receive a reminder letter in the mail two months in advance.   If you don't receive a letter, please call our office to schedule the follow-up appointment.  ICM clinic with Alvis Lemmings, RN  Any Other Special Instructions Will Be Listed Below (If Applicable).

## 2014-10-16 NOTE — Progress Notes (Signed)
Electrophysiology Office Note   Date:  10/16/2014   ID:  Sylvia Moore, DOB Feb 21, 1951, MRN [id number]  PCP:  Kandice Hams, MD  Cardiologist:  Dr Irish Lack Primary Electrophysiologist: Thompson Grayer, MD    Chief Complaint  Patient presents with  . CHF  . NICM     History of Present Illness: Sylvia Moore is a 64 y.o. female who presents today for electrophysiology evaluation.   Reports significant clinical improvement with CRT.  Hypotension has improved with reduced lasix and stopping spironolactone.  Today, she denies symptoms of palpitations, chest pain, shortness of breath, orthopnea, PND, lower extremity edema, claudication, dizziness, presyncope, syncope, bleeding, or neurologic sequela. The patient is tolerating medications without difficulties and is otherwise without complaint today.    Past Medical History  Diagnosis Date  . Chronic systolic heart failure   . Depression   . Gout        . HOH (hard of hearing)     has hearing aids  . Schwannoma of spinal cord     T3-T4  . Orthostatic hypotension        . NICM (nonischemic cardiomyopathy)     a. 2/2 radiation and chemotherapy b. echo 2/16  EF 20-25% c. non ischemic myoview 5/14 d. s/p STJ CRTD  . GERD (gastroesophageal reflux disease)   . LBBB (left bundle branch block)   . Pneumonia 1960's X 1; 2014  . Hypothyroidism     a. s/p thyroid radiation  . History of blood transfusion ~ 1976    "related to cyst on my lung"  . Hepatitis ~ 1978    "got hepatitis w/mono; don't know which kind"  . Hodgkin's lymphoma     a. s/p rad' and chemo  in 1975-1977 b. s/p splenectomy 1970's   Past Surgical History  Procedure Laterality Date  . Hysteroscopy w/d&c    . Colonoscopy w/ polypectomy    . Lumbar fusion  07/2011    T11-L1 posterior lateral  . Dilation and curettage of uterus    . Abdominal exploration surgery  1975    r/t hodgkins disease - had chemo and rad  . Knee arthroscopy Left   . Colposcopy N/A  08/14/2012    Procedure: COLPOSCOPY;  Surgeon: [name], MD;  Location: [facility];  Service: Gynecology;  Laterality: N/A;  Colpo of Vulva and Vagina  . Lesion removal N/A 08/14/2012    Procedure: EXCISION VAGINAL LESION;  Surgeon: [name], MD;  Location: [facility];  Service: Gynecology;  Laterality: N/A;  Wide Local Excision of Right Labia Majori  . Rectal biopsy N/A 08/14/2012    Procedure: BIOPSY RECTAL;  Surgeon: [name], MD;  Location: [facility];  Service: Gynecology;  Laterality: N/A;  peri-anal biopsy  . Esophagogastroduodenoscopy (egd) with propofol N/A 01/20/2014    Procedure: ESOPHAGOGASTRODUODENOSCOPY (EGD) WITH PROPOFOL;  Surgeon: [name], MD;  Location: [facility];  Service: Endoscopy;  Laterality: N/A;  . Colonoscopy with propofol N/A 01/20/2014    Procedure: COLONOSCOPY WITH PROPOFOL;  Surgeon: [name], MD;  Location: [facility];  Service: Endoscopy;  Laterality: N/A;  . Knee arthroscopy Right 2015    "torn meniscus"  . Left heart catheterization with coronary angiogram N/A 07/01/2014    Procedure: LEFT HEART CATHETERIZATION WITH CORONARY ANGIOGRAM;  Surgeon: [name], MD;  Location: [facility] C[facility] LAB;  Service: Cardiovascular;  Laterality: N/A;  . Back surgery    . Laparoscopic cholecystectomy  ~ 2005  . Neck lesion biopsy  Right 1975  . Splenectomy  1970's  . Bi-ventricular implantable cardioverter defibrillator N/A 07/08/2014    STJ Quadra Assura CRTD implanted by Dr Rayann Heman     Current Outpatient Prescriptions  Medication Sig Dispense Refill  . allopurinol (ZYLOPRIM) 100 MG tablet Take 100 mg by mouth 2 (two) times daily.    . carvedilol (COREG) 12.5 MG tablet Take 0.5 tablets (6.25 mg total) by mouth 2 (two) times daily with a meal. 60 tablet 6  . furosemide (LASIX) 20 MG tablet Take 1 tablet (20 mg total) by mouth every other day. 30 tablet 11  . ivabradine (CORLANOR) 5 MG TABS tablet Take 1.5 tablets (7.5 mg total) by mouth 2 (two) times daily  with a meal. 90 tablet 3  . levothyroxine (SYNTHROID, LEVOTHROID) 125 MCG tablet Take 125 mcg by mouth daily.    . Multiple Vitamins-Minerals (MULTIVITAMIN PO) Take 1 tablet by mouth daily.    . nitroGLYCERIN (NITROSTAT) 0.4 MG SL tablet Place 1 tablet (0.4 mg total) under the tongue every 5 (five) minutes as needed for chest pain. 25 tablet 3  . omeprazole (PRILOSEC OTC) 20 MG tablet Take 20 mg by mouth daily.    . valsartan (DIOVAN) 40 MG tablet Take 1 tablet (40 mg total) by mouth daily. 30 tablet 11  . venlafaxine XR (EFFEXOR-XR) 37.5 MG 24 hr capsule Take 37.5 mg by mouth daily with breakfast.     No current facility-administered medications for this visit.    Allergies:   Codeine   Social History:  The patient  reports that she quit smoking about 46 years ago. Her smoking use included Cigarettes. She has a .25 pack-year smoking history. She has never used smokeless tobacco. She reports that she drinks about 1.8 oz of alcohol per week. She reports that she uses illicit drugs (Marijuana).   Family History:  The patient's family history includes Healthy in her brother and sister; Heart failure in her father; Hypertension in her father; Pneumonia in her mother; Stroke in her father.    ROS:  Please see the history of present illness.   All other systems are reviewed and negative.    PHYSICAL EXAM: VS:  BP 126/82 mmHg  Pulse 79  Ht 5\' 6"  (1.676 m)  Wt 95.618 kg (210 lb 12.8 oz)  BMI 34.04 kg/m2 , BMI Body mass index is 34.04 kg/(m^2). GEN: Well nourished, well developed, in no acute distress HEENT: normal Neck: no JVD, carotid bruits, or masses Cardiac: RRR; no murmurs, rubs, or gallops,no edema  Respiratory:  clear to auscultation bilaterally, normal work of breathing GI: soft, nontender, nondistended, + BS MS: no deformity or atrophy Skin: warm and dry, device pocket is well healed Neuro:  Strength and sensation are intact Psych: euthymic mood, full affect  EKG:  EKG is  ordered today. The ekg ordered today shows sinus with BiV pacing  Device interrogation is reviewed today in detail.  See PaceArt for details.   Recent Labs: 06/18/2014: ALT 29; B Natriuretic Peptide 432.5*; Magnesium 1.9; TSH 6.101* 08/25/2014: BUN 23; Creatinine, Ser 0.79; Hemoglobin 13.3; Platelets 444.0*; Potassium 4.4; Sodium 132*    Lipid Panel     Component Value Date/Time   CHOL 164 06/19/2014 0057   TRIG 97 06/19/2014 0057   HDL 42 06/19/2014 0057   CHOLHDL 3.9 06/19/2014 0057   VLDL 19 06/19/2014 0057   LDLCALC 103* 06/19/2014 0057     Wt Readings from Last 3 Encounters:  10/16/14 95.618 kg (210 lb 12.8  oz)  08/25/14 94.53 kg (208 lb 6.4 oz)  07/08/14 94.802 kg (209 lb)      ASSESSMENT AND PLAN:  1.  Nonischemic CM/ LBBB/ chronic systolic dysfunction euvolemic today Increase corlanor to 7.5mg  BID Will enroll in the ICM device clinic Normal BiV ICD function See Pace Art report No changes today  Repeat echo in 3 months to assess response to CRT.  Follow-up- merlin, return to se EP NP in 1 year,  Follow-up with Dr Beau Fanny as scheduled  Current medicines are reviewed at length with the patient today.   The patient does not have concerns regarding her medicines.  The following changes were made today:  none  Labs/ tests ordered today include:  Orders Placed This Encounter  Procedures  . EKG 12-Lead     Signed, Thompson Grayer, MD  10/16/2014 11:16 AM     [facility] [address] [place] [address] [phone] (office) [phone] (fax)

## 2014-11-17 ENCOUNTER — Encounter: Payer: Self-pay | Admitting: *Deleted

## 2014-11-17 ENCOUNTER — Telehealth: Payer: Self-pay | Admitting: Interventional Cardiology

## 2014-11-17 ENCOUNTER — Ambulatory Visit (INDEPENDENT_AMBULATORY_CARE_PROVIDER_SITE_OTHER): Payer: BC Managed Care – PPO | Admitting: *Deleted

## 2014-11-17 DIAGNOSIS — I5023 Acute on chronic systolic (congestive) heart failure: Secondary | ICD-10-CM

## 2014-11-17 DIAGNOSIS — Z95810 Presence of automatic (implantable) cardiac defibrillator: Secondary | ICD-10-CM | POA: Diagnosis not present

## 2014-11-17 NOTE — Telephone Encounter (Signed)
I spoke with the patient. 
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
## 2014-11-17 NOTE — Telephone Encounter (Signed)
New message    Sylvia Moore please call regarding her remote check this morning.  Please call to discuss

## 2014-11-21 ENCOUNTER — Telehealth: Payer: Self-pay | Admitting: Internal Medicine

## 2014-11-21 NOTE — Telephone Encounter (Signed)
Follow up     Pt returning Shakila's call.

## 2014-11-21 NOTE — Telephone Encounter (Signed)
LMTCB//sss 

## 2014-11-21 NOTE — Telephone Encounter (Signed)
Informed patient that she would not have to take the equipment on her cruise if she didn't want to bc she's only going to be gone for 2 weeks. I did tell pt that she definitely could take it with her if she would feel more comfortable. I explained to patient that her cell adaptor reaches out to the local cell towers and sends the information so she would be able to use the monitor anywhere she has cell svc. Instructed patient on how to send a manual transmission. Patient voiced understanding and appreciation for information given.

## 2014-11-21 NOTE — Telephone Encounter (Signed)
New message     Pt has merlin device.  Pt going on vacation for 2 weeks and wants to know if device/machine needs wi-fi. Please call to discuss

## 2014-12-18 ENCOUNTER — Ambulatory Visit (INDEPENDENT_AMBULATORY_CARE_PROVIDER_SITE_OTHER): Payer: BC Managed Care – PPO | Admitting: *Deleted

## 2014-12-18 ENCOUNTER — Encounter: Payer: Self-pay | Admitting: *Deleted

## 2014-12-18 DIAGNOSIS — I420 Dilated cardiomyopathy: Secondary | ICD-10-CM

## 2014-12-18 DIAGNOSIS — Z95810 Presence of automatic (implantable) cardiac defibrillator: Secondary | ICD-10-CM | POA: Diagnosis not present

## 2014-12-18 NOTE — Progress Notes (Signed)
EPIC Encounter for ICM Monitoring  Patient Name: Sylvia Moore is a 64 y.o. female Date: 12/18/2014 Primary Care Physican: Kandice Hams, MD Primary Cardiologist: Irish Lack Electrophysiologist: Allred Dry Weight: 211 lbs  Bi-V pacing: >99%       In the past month, have you:  1. Gained more than 2 pounds in a day or more than 5 pounds in a week? Yes- she weighed after getting home from her cruise and was up 4 lbs.  2. Had changes in your medications (with verification of current medications)? Yes- She has taken her lasix daily instead of every other day since she has been home from her cruise ~ 8/8.  3. Had more shortness of breath than is usual for you? Yes- the patient was recently on a cruise the first week of August and notice increased SOB and propping up on extra pillows.  4. Limited your activity because of shortness of breath? no  5. Not been able to sleep because of shortness of breath? no  6. Had increased swelling in your feet or ankles? Yes- she reports that she took a red eye flight home from her cruise and she had a significant amount of feet/ ankle edema.  7. Had symptoms of dehydration (dizziness, dry mouth, increased thirst, decreased urine output) no  8. Had changes in sodium restriction? Yes- she was eating differerently on her cruise and drinking more.  9. Been compliant with medication? Yes   ICM trend:   Follow-up plan: ICM clinic phone appointment: 01/21/15. The patient's corvue readings were showing a rise in her fluids from ~7/28-8/5. She as on a cruise during this time. She did have a 4 lb weight gain/ increased SOB/ swelling during this time. She has increased her lasix to once daily from every other day since returning home ~ 8/8. She is down 3 lbs and her swelling his resolved. She still develops a cough when she lays down at night. She is still propping up on pillows. I have advised her to continue her daily lasix until her symptoms are completely  resolved. She also has noticed some occasional fleeting chest heaviness over the last week. I have advised her to monitor this and if the heaviness becomes sustained, to try and NTG and call and let us know if this helps. She verbalizes understanding.  Copy of note sent to patient's primary care physician, primary cardiologist, and device following physician.  Alvis Lemmings, RN 12/18/2014 1:00 PM

## 2014-12-26 ENCOUNTER — Other Ambulatory Visit: Payer: Self-pay | Admitting: Nurse Practitioner

## 2014-12-26 ENCOUNTER — Other Ambulatory Visit: Payer: Self-pay | Admitting: Internal Medicine

## 2014-12-26 MED ORDER — VENLAFAXINE HCL ER 37.5 MG PO CP24: 37.5000 mg | ORAL_CAPSULE | Freq: Every day | ORAL | Status: DC

## 2015-01-14 ENCOUNTER — Telehealth: Payer: Self-pay | Admitting: Interventional Cardiology

## 2015-01-14 NOTE — Telephone Encounter (Signed)
New message     Pt is having problems with dizziness.  Please advise

## 2015-01-14 NOTE — Telephone Encounter (Signed)
Called patient back about her call. Patient complaining of dizziness on days when taking her Lasix.  Patient stated that her BP and HR are low for her, BP 128/67 &  HR 67. Informed patient that her BP and HR are good. Dizziness comes on during standing and does not go away until she sits down. Patient stated she is fine when sitting. Encouraged patient to reposition slowly. Patient denies any chest pain, SOB, or any other symptoms. Will forward to Dr. Irish Lack for further advisement.

## 2015-01-14 NOTE — Telephone Encounter (Signed)
If she is not noticing [facility] or other signs of fluid overload like edema, can reduce the frequency of Lasix.  If she does start to retain fluid, she needs the Lasix and should not wait too long to avoid fluid retention requiring hospitalization.

## 2015-01-15 NOTE — Telephone Encounter (Signed)
Called patient back with Dr. Hassell Done advise. Patient verbalized understanding and will try taking her Lasix every two to three days, instead of every other day. Encouraged patient to give the office an update on Monday to see how this is working for her. Patient stated she has already stopped taking her Lasix for the last two days and her dizziness has gotten better. Patient will let the office know how she is doing on Monday and confirmed her appointment for echo on Monday as well.

## 2015-01-15 NOTE — Telephone Encounter (Signed)
Follow up    Pt states she did not receive call back yesterday from nurse regarding dizziness Please call for an update

## 2015-01-16 ENCOUNTER — Telehealth: Payer: Self-pay | Admitting: Interventional Cardiology

## 2015-01-16 NOTE — Telephone Encounter (Signed)
Patient calling about her dizziness. Patient stated she is still dizzy, but it has improved. Patient has not taken her Lasix since Tuesday. Patient denies SOB, edema, or more than 3 lbs weight gain. Reinformed patient of Dr. Hassell Done response from 01/14/2015. Patient's BP 107/57 and HR 77. Informed patient if she is not having SOB, edema, or weight gain, that she should be fine not taking her Lasix, but should take it as soon as she has any of these symptoms. Patent concerned with BP. Informed patient that BP and HR are fine, and her HR is going to be lower with taking Corlanor. Infromed patient that this medication should not be lowering her BP, and to call our office if SBP goes below 100. Will send to Dr. Irish Lack for review and any further advisement.  [name], MD at 01/14/2015 1:12 PM     Status: Signed       Expand All Collapse All   If she is not noticing [facility] or other signs of fluid overload like edema, can reduce the frequency of Lasix. If she does start to retain fluid, she needs the Lasix and should not wait too long to avoid fluid retention requiring hospitalization.

## 2015-01-16 NOTE — Telephone Encounter (Signed)
New message     Pt c/o medication issue:  1. Name of Medication: Lasix  2. How are you currently taking this medication (dosage and times per day)? 20mg   3. Are you having a reaction (difficulty breathing--STAT)? No  4. What is your medication issue? Pt having dizziness; pt hasn't taken since Tuesday                        Pt wanting to know if she needs to be seen before her next appt. with Dr Irish Lack

## 2015-01-19 ENCOUNTER — Other Ambulatory Visit: Payer: Self-pay

## 2015-01-19 ENCOUNTER — Ambulatory Visit (HOSPITAL_COMMUNITY): Payer: BC Managed Care – PPO | Attending: Nurse Practitioner

## 2015-01-19 DIAGNOSIS — I5022 Chronic systolic (congestive) heart failure: Secondary | ICD-10-CM | POA: Diagnosis present

## 2015-01-19 DIAGNOSIS — E039 Hypothyroidism, unspecified: Secondary | ICD-10-CM | POA: Insufficient documentation

## 2015-01-19 DIAGNOSIS — I10 Essential (primary) hypertension: Secondary | ICD-10-CM | POA: Diagnosis not present

## 2015-01-19 DIAGNOSIS — I351 Nonrheumatic aortic (valve) insufficiency: Secondary | ICD-10-CM | POA: Insufficient documentation

## 2015-01-19 DIAGNOSIS — I340 Nonrheumatic mitral (valve) insufficiency: Secondary | ICD-10-CM | POA: Diagnosis not present

## 2015-01-19 DIAGNOSIS — I071 Rheumatic tricuspid insufficiency: Secondary | ICD-10-CM | POA: Insufficient documentation

## 2015-01-19 NOTE — Telephone Encounter (Signed)
Agree with above.  BP well controlled.

## 2015-01-21 ENCOUNTER — Encounter: Payer: Self-pay | Admitting: Internal Medicine

## 2015-01-21 ENCOUNTER — Ambulatory Visit (INDEPENDENT_AMBULATORY_CARE_PROVIDER_SITE_OTHER): Payer: BC Managed Care – PPO | Admitting: *Deleted

## 2015-01-21 DIAGNOSIS — I429 Cardiomyopathy, unspecified: Secondary | ICD-10-CM | POA: Diagnosis not present

## 2015-01-21 DIAGNOSIS — Z95810 Presence of automatic (implantable) cardiac defibrillator: Secondary | ICD-10-CM

## 2015-01-21 DIAGNOSIS — I428 Other cardiomyopathies: Secondary | ICD-10-CM

## 2015-01-21 DIAGNOSIS — I5023 Acute on chronic systolic (congestive) heart failure: Secondary | ICD-10-CM | POA: Diagnosis not present

## 2015-01-21 NOTE — Progress Notes (Signed)
Remote ICD transmission.   

## 2015-01-21 NOTE — Progress Notes (Addendum)
EPIC Encounter for ICM Monitoring  Patient Name: Sylvia Moore is a 64 y.o. female Date: 01/21/2015 Primary Care Physican: Kandice Hams, MD Primary Cardiologist: Irish Lack Electrophysiologist: Allred Dry Weight: 208 lb       In the past month, have you:  1. Gained more than 2 pounds in a day or more than 5 pounds in a week? no  2. Had changes in your medications (with verification of current medications)? Yes, Lasix prn  3. Had more shortness of breath than is usual for you? no  4. Limited your activity because of shortness of breath? no  5. Not been able to sleep because of shortness of breath? No, she normally sleeps with 1 pillow  6. Had increased swelling in your feet or ankles? no  7. Had symptoms of dehydration (dizziness, dry mouth, increased thirst, decreased urine output) no  8. Had changes in sodium restriction? no  9. Been compliant with medication? Yes   ICM trend:   Follow-up plan: ICM clinic phone appointment 03/02/2015.  Impedance above baseline from ~01/09/2015 to 01/20/2015 and explained it appears she may have had some dehydration during that period of time.  She reported she has been having dizziness in the last 3 months but was worse in the last 2 weeks.  Education given on dehydration symptoms which could include low blood pressure and dizziness.  She is taking Lasix prn as discussed with Dr Irish Lack.  She is uncertain if she should limit the amount of fluid intake and encouraged her to discuss at the appointment on 01/26/2015 with Dr Irish Lack.  She denied any HF symptoms.  She stated her EF % has improved and she is very happy about that.  She asked if her meds Lasix or Corlanor can cause dizziness and I stated yes, they both can have that side effect.  No changes today.     Copy of note sent to patient's primary care physician, primary cardiologist, and device following physician.  Rosalene Billings, RN, CCM 01/21/2015 3:34 PM

## 2015-01-24 LAB — CUP PACEART REMOTE DEVICE CHECK
Battery Remaining Longevity: 79 mo
Battery Remaining Percentage: 86 %
Battery Voltage: 3.05 V
Brady Statistic AP VP Percent: 1.1 %
Brady Statistic AS VP Percent: 98 %
HIGH POWER IMPEDANCE MEASURED VALUE: 70 Ohm
HighPow Impedance: 70 Ohm
Lead Channel Impedance Value: 1350 Ohm
Lead Channel Impedance Value: 600 Ohm
Lead Channel Pacing Threshold Amplitude: 1.125 V
Lead Channel Pacing Threshold Amplitude: 1.375 V
Lead Channel Pacing Threshold Pulse Width: 0.5 ms
Lead Channel Pacing Threshold Pulse Width: 0.7 ms
Lead Channel Sensing Intrinsic Amplitude: 12 mV
Lead Channel Setting Pacing Amplitude: 2 V
Lead Channel Setting Pacing Amplitude: 2.125
Lead Channel Setting Pacing Amplitude: 2.375
Lead Channel Setting Pacing Pulse Width: 0.5 ms
Lead Channel Setting Pacing Pulse Width: 0.7 ms
Lead Channel Setting Sensing Sensitivity: 0.5 mV
MDC IDC MSMT LEADCHNL RA IMPEDANCE VALUE: 490 Ohm
MDC IDC MSMT LEADCHNL RA PACING THRESHOLD PULSEWIDTH: 0.5 ms
MDC IDC MSMT LEADCHNL RA SENSING INTR AMPL: 4.5 mV
MDC IDC MSMT LEADCHNL RV PACING THRESHOLD AMPLITUDE: 0.625 V
MDC IDC SESS DTM: 20160921073317
MDC IDC SET ZONE DETECTION INTERVAL: 250 ms
MDC IDC SET ZONE DETECTION INTERVAL: 280 ms
MDC IDC STAT BRADY AP VS PERCENT: 1 %
MDC IDC STAT BRADY AS VS PERCENT: 1 %
MDC IDC STAT BRADY RA PERCENT PACED: 1 %
Pulse Gen Serial Number: 7204299
Zone Setting Detection Interval: 340 ms

## 2015-01-26 ENCOUNTER — Encounter: Payer: Self-pay | Admitting: Interventional Cardiology

## 2015-01-26 ENCOUNTER — Ambulatory Visit (INDEPENDENT_AMBULATORY_CARE_PROVIDER_SITE_OTHER): Payer: BC Managed Care – PPO | Admitting: Interventional Cardiology

## 2015-01-26 VITALS — BP 110/70 | HR 80 | Ht 66.0 in | Wt 208.0 lb

## 2015-01-26 DIAGNOSIS — I5022 Chronic systolic (congestive) heart failure: Secondary | ICD-10-CM | POA: Diagnosis not present

## 2015-01-26 DIAGNOSIS — I429 Cardiomyopathy, unspecified: Secondary | ICD-10-CM

## 2015-01-26 DIAGNOSIS — R42 Dizziness and giddiness: Secondary | ICD-10-CM | POA: Diagnosis not present

## 2015-01-26 DIAGNOSIS — I428 Other cardiomyopathies: Secondary | ICD-10-CM

## 2015-01-26 DIAGNOSIS — R2681 Unsteadiness on feet: Secondary | ICD-10-CM | POA: Diagnosis not present

## 2015-01-26 NOTE — Progress Notes (Signed)
Patient ID: Sylvia Moore, female   DOB: May 20, 1950, 64 y.o.   MRN: [id number]     Cardiology Office Note   Date:  01/26/2015   ID:  Sylvia Moore, DOB 10/13/1950, MRN [id number]  PCP:  Kandice Hams, MD    No chief complaint on file.  follow-up cardiomyopathy   Wt Readings from Last 3 Encounters:  01/26/15 208 lb (94.348 kg)  10/16/14 210 lb 12.8 oz (95.618 kg)  08/25/14 208 lb 6.4 oz (94.53 kg)       History of Present Illness: Sylvia Moore is a 64 y.o. 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 Rest/stress myocardial perfusion scan on 09/04/12 EF was 35%, no ischemia noted.  She had a clean cath in March 2016.  She also has a history of Hodgkin's lymphoma, status post splenectomy in 1970, history of decompression of T3-T4 spinal schwannoma, hypothyroidism, and orthostatic hypotension.  She has had low blood pressure. Readings are better after stopping furosemide and spironolactone. She underwent biventricular pacer implantation in March 2016. Since that time, her ejection fraction is improved from 25-30% up to the 35-40% range. She feels better. She is not retaining fluids despite not taking diuretics. She makes sure to take meds with foods. She denied any edema, shortness of breath or high blood pressure.   She continues to have episodic dizziness. This is her biggest problem. It is more of an unsteadiness. She feels like she is "drunk." She has difficulty walking straight and needs to hold onto something for balance. She is not lightheaded and has not passed out. Her device checks have shown that she is not fluid overloaded. No arrhythmias have been noted. Dizziness is occurring at any blood pressure. She has very rare episodes of lower blood pressures, down to 92 systolic. Most blood pressures are in the 100 to 9:35 range systolic. She can even have dizziness at those levels.  Of note, she has been on Effexor or for many  years. The dose was increased back to 75 mg daily.    Past Medical History  Diagnosis Date  . Chronic systolic heart failure   . Depression   . Gout        . HOH (hard of hearing)     has hearing aids  . Schwannoma of spinal cord     T3-T4  . Orthostatic hypotension        . NICM (nonischemic cardiomyopathy)     a. 2/2 radiation and chemotherapy b. echo 2/16  EF 20-25% c. non ischemic myoview 5/14 d. s/p STJ CRTD  . GERD (gastroesophageal reflux disease)   . LBBB (left bundle branch block)   . Pneumonia 1960's X 1; 2014  . Hypothyroidism     a. s/p thyroid radiation  . History of blood transfusion ~ 1976    "related to cyst on my lung"  . Hepatitis ~ 1978    "got hepatitis w/mono; don't know which kind"  . Hodgkin's lymphoma     a. s/p rad' and chemo  in 1975-1977 b. s/p splenectomy 1970's    Past Surgical History  Procedure Laterality Date  . Hysteroscopy w/d&c    . Colonoscopy w/ polypectomy    . Lumbar fusion  07/2011    T11-L1 posterior lateral  . Dilation and curettage of uterus    . Abdominal exploration surgery  1975    r/t hodgkins disease - had chemo and rad  . Knee arthroscopy Left   .  Colposcopy N/A 08/14/2012    Procedure: COLPOSCOPY;  Surgeon: [name], MD;  Location: [facility];  Service: Gynecology;  Laterality: N/A;  Colpo of Vulva and Vagina  . Lesion removal N/A 08/14/2012    Procedure: EXCISION VAGINAL LESION;  Surgeon: [name], MD;  Location: [facility];  Service: Gynecology;  Laterality: N/A;  Wide Local Excision of Right Labia Majori  . Rectal biopsy N/A 08/14/2012    Procedure: BIOPSY RECTAL;  Surgeon: [name], MD;  Location: [facility];  Service: Gynecology;  Laterality: N/A;  peri-anal biopsy  . Esophagogastroduodenoscopy (egd) with propofol N/A 01/20/2014    Procedure: ESOPHAGOGASTRODUODENOSCOPY (EGD) WITH PROPOFOL;  Surgeon: [name], MD;  Location: [facility];  Service: Endoscopy;  Laterality: N/A;  . Colonoscopy with propofol  N/A 01/20/2014    Procedure: COLONOSCOPY WITH PROPOFOL;  Surgeon: [name], MD;  Location: [facility];  Service: Endoscopy;  Laterality: N/A;  . Knee arthroscopy Right 2015    "torn meniscus"  . Left heart catheterization with coronary angiogram N/A 07/01/2014    Procedure: LEFT HEART CATHETERIZATION WITH CORONARY ANGIOGRAM;  Surgeon: [name], MD;  Location: [facility];  Service: Cardiovascular;  Laterality: N/A;  . Back surgery    . Laparoscopic cholecystectomy  ~ 2005  . Neck lesion biopsy Right 1975  . Splenectomy  1970's  . Bi-ventricular implantable cardioverter defibrillator N/A 07/08/2014    STJ Quadra Assura CRTD implanted by Dr Rayann Heman     Current Outpatient Prescriptions  Medication Sig Dispense Refill  . allopurinol (ZYLOPRIM) 100 MG tablet Take 100 mg by mouth 2 (two) times daily.    . carvedilol (COREG) 12.5 MG tablet Take 0.5 tablets (6.25 mg total) by mouth 2 (two) times daily with a meal. 60 tablet 6  . ivabradine (CORLANOR) 5 MG TABS tablet Take 1.5 tablets (7.5 mg total) by mouth 2 (two) times daily with a meal. 90 tablet 3  . levothyroxine (SYNTHROID, LEVOTHROID) 125 MCG tablet Take 125 mcg by mouth daily.    . Multiple Vitamins-Minerals (MULTIVITAMIN PO) Take 1 tablet by mouth daily.    . nitroGLYCERIN (NITROSTAT) 0.4 MG SL tablet Place 1 tablet (0.4 mg total) under the tongue every 5 (five) minutes as needed for chest pain. 25 tablet 3  . omeprazole (PRILOSEC OTC) 20 MG tablet Take 20 mg by mouth daily.    . valsartan (DIOVAN) 40 MG tablet Take 1 tablet (40 mg total) by mouth daily. 30 tablet 11  . venlafaxine XR (EFFEXOR-XR) 75 MG 24 hr capsule Take 75 mg by mouth daily.     No current facility-administered medications for this visit.    Allergies:   Codeine    Social History:  The patient  reports that she quit smoking about 46 years ago. Her smoking use included Cigarettes. She has a .25 pack-year smoking history. She has never used smokeless  tobacco. She reports that she drinks about 1.8 oz of alcohol per week. She reports that she uses illicit drugs (Marijuana).   Family History:  The patient's family history includes Healthy in her brother and sister; Heart failure in her father; Hypertension in her father; Pneumonia in her mother; Stroke in her father.    ROS:  Please see the history of present illness.   Otherwise, review of systems are positive for dizziness.   All other systems are reviewed and negative.    PHYSICAL EXAM: VS:  BP 110/70 mmHg  Pulse 80  Ht 5\' 6"  (3.762 m)  Wt 208 lb (94.348 kg)  BMI 33.59 kg/m2 , BMI Body mass index is 33.59 kg/(m^2). GEN: Well nourished, well developed, in no acute distress HEENT: normal Neck: no JVD, carotid bruits, or masses Cardiac: RRR; no murmurs, rubs, or gallops,no edema  Respiratory:  clear to auscultation bilaterally, normal work of breathing GI: soft, nontender, nondistended, + BS MS: no deformity or atrophy Skin: warm and dry, no rash Neuro:  Strength and sensation are intact Psych: euthymic mood, full affect     Recent Labs: 06/18/2014: ALT 29; B Natriuretic Peptide 432.5*; Magnesium 1.9; TSH 6.101* 08/25/2014: BUN 23; Creatinine, Ser 0.79; Hemoglobin 13.3; Platelets 444.0*; Potassium 4.4; Sodium 132*   Lipid Panel    Component Value Date/Time   CHOL 164 06/19/2014 0057   TRIG 97 06/19/2014 0057   HDL 42 06/19/2014 0057   CHOLHDL 3.9 06/19/2014 0057   VLDL 19 06/19/2014 0057   LDLCALC 103* 06/19/2014 0057     Other studies Reviewed: Additional studies/ records that were reviewed today with results demonstrating: Echocardiogram from September 2016 showing improved ejection fraction as noted above. Cath report from March 2016 reviewed.    ASSESSMENT AND PLAN:  1. NICM: Cardiac function much improved after CRT.  Followed by EP.  Continue carvedilol and correlate or. 2. Chronic systolic heart failure: Well compensated at this time even off of diuretics. She  can use the Lasix as needed. She has not taken a single dose in over a week. She notes that her ankles are staying skinny. 3. Dizziness/unsteadiness: Not clear that this is from a cardiac cause.  WOuld ceck if Effexor could be contributing since it is listed as  aknown side effect.  Dizziness happening with normal blood pressures. Would not cut back on any of her cardiac meds at this time as her blood pressure is well controlled. Continue to hold off on diuretics as she has remained euvolemic with increased LV function.   Current medicines are reviewed at length with the patient today.  The patient concerns regarding her medicines were addressed.  The following changes have been made:  No change  Labs/ tests ordered today include: No orders of the defined types were placed in this encounter.    Recommend 150 minutes/week of aerobic exercise Low fat, low carb, high fiber diet recommended  Disposition:   FU in 1 year   Teresita Madura., MD  01/26/2015 8:50 AM    [facility] [address], [place], [place]  [address] Phone: [phone]; Fax: [phone]

## 2015-01-26 NOTE — Patient Instructions (Addendum)
Medication Instructions:  Your physician recommends that you continue on your current medications as directed. Please refer to the Current Medication list given to you today.   Labwork: None ordered  Testing/Procedures: None ordered  Follow-Up: Your physician wants you to follow-up in: [place] DR. VARANAIS.  You will receive a reminder letter in the mail two months in advance. If you don't receive a letter, please call our office to schedule the follow-up appointment.   Any Other Special Instructions Will Be Listed Below (If Applicable). Please contact your Primary Care Physician re: Venlafaxine and the symptoms you are currently experiencing to discuss the side effects of this medication.

## 2015-01-27 ENCOUNTER — Telehealth: Payer: Self-pay | Admitting: Interventional Cardiology

## 2015-01-27 NOTE — Telephone Encounter (Signed)
New problem   Pt has also had two severe headache and forget to let doctors know during visit.

## 2015-01-27 NOTE — Telephone Encounter (Signed)
Spoke with pt's spouse, Lelon Frohlich, and she states that when pt was here yesterday to see Dr. Irish Lack, she forgot to mention in the last 3-4 weeks she has had 2 migraines which she has not had since starting cardiac medications. Ann states pt did have some dizziness yesterday. Pt is also going to now start taking a half dose of her Effexor BID instead of taking the full dose QD. Delton See that I will route this information to Dr. Irish Lack to make him aware and will call pt if he has any suggestions or changes, otherwise they can certainly call if any concerns/questions arise. Ann verbalized understanding and was appreciative for return call.

## 2015-01-28 NOTE — Telephone Encounter (Signed)
Would check with PharmD if Corlanor can cause migraines.

## 2015-01-28 NOTE — Telephone Encounter (Signed)
Spoke with Sylvia Moore, Sylvia Moore and she states that it's not really that Corlanor would cause a migraine but that it can cause visual disturbances or light sensitivity which could be triggers for the pt's migraines. Spoke with pt and she states that she has only had the two severe headaches/migraines but she does get headaches about every 4-5 days that comes and goes fairly quickly. Pt states that she does not feel she has had any side effects from the Corlanor. Corlanor was increased back in June to 7.5mg  BID but pt says she cant really say if the headaches started occuring more often after the increase. Pt states that she is feeling great today, no dizziness or balance issues thus far with the change in splitting the dose of Effexor. Will forward to Dr. Irish Lack for review.

## 2015-01-29 NOTE — Telephone Encounter (Signed)
OK. No changes for now. COntinue to monitor.

## 2015-01-29 NOTE — Telephone Encounter (Signed)
Spoke with pt and informed her no changes for now and to continue to monitor. Pt verbalized understanding and was appreciative for the call.

## 2015-02-05 ENCOUNTER — Encounter: Payer: Self-pay | Admitting: Interventional Cardiology

## 2015-03-02 ENCOUNTER — Ambulatory Visit (INDEPENDENT_AMBULATORY_CARE_PROVIDER_SITE_OTHER): Payer: BC Managed Care – PPO

## 2015-03-02 DIAGNOSIS — Z95810 Presence of automatic (implantable) cardiac defibrillator: Secondary | ICD-10-CM | POA: Diagnosis not present

## 2015-03-02 DIAGNOSIS — I5022 Chronic systolic (congestive) heart failure: Secondary | ICD-10-CM | POA: Diagnosis not present

## 2015-03-02 NOTE — Progress Notes (Signed)
EPIC Encounter for ICM Monitoring  Patient Name: Sylvia Moore is a 64 y.o. female Date: 03/02/2015 Primary Care Physican: Kandice Hams, MD Primary Cardiologist: Irish Lack Electrophysiologist: Allred Dry Weight: 209 lb       Bi-V Pacing > 99%  In the past month, have you:  1. Gained more than 2 pounds in a day or more than 5 pounds in a week? no  2. Had changes in your medications (with verification of current medications)? no  3. Had more shortness of breath than is usual for you? no  4. Limited your activity because of shortness of breath? no  5. Not been able to sleep because of shortness of breath? no  6. Had increased swelling in your feet or ankles? no  7. Had symptoms of dehydration (dizziness, dry mouth, increased thirst, decreased urine output) no  8. Had changes in sodium restriction? no  9. Been compliant with medication? Yes   ICM trend: 03/02/2015    Follow-up plan: ICM clinic phone appointment on 04/02/2015.  Corvue impedance below baseline 01/31/2015 to 02/15/2015 suggesting fluid retention and above baseline 02/18/2015 to 03/01/2015 suggesting dryness.  She reported she was not taking Furosemide 20mg  at the first of the month but decided she felt like she was having fluid and started taking daily for the last 2 weeks.  Advised when taking Furosemide she may need to increase her fluid intake.  Dr Irish Lack reported at office visit on 01/26/2015, she can take Furosemide prn.  She has no HF symptoms at this time and reviewed symptoms to report and to use Furosemide as prescribed at last office visit.  No changes today.  Copy of note sent to patient's primary care physician, primary cardiologist, and device following physician.    Rosalene Billings, RN, CCM 03/02/2015 10:42 AM

## 2015-03-09 ENCOUNTER — Telehealth: Payer: Self-pay | Admitting: Interventional Cardiology

## 2015-03-09 NOTE — Telephone Encounter (Signed)
New message      Pt c/o BP issue: STAT if pt c/o blurred vision, one-sided weakness or slurred speech  1. What are your last 5 BP readings? 158/89, 135/70, 2. Are you having any other symptoms (ex. Dizziness, headache, blurred vision, passed out)? Bad headache all day sunday 3. What is your BP issue?  Pt states her bp is high. Usually it is 110/70

## 2015-03-09 NOTE — Telephone Encounter (Signed)
The pt states that she had a bad headache all day yesterday and that her BP last night was 158/89. She feels that her BP was elevated due to the pain of her headache. This morning she reports that her headache is gone and that she checked her BP this morning before she took her medications and it was 135/70. She states that she took her medications at 9 am this morning and has not re checked her BP since taking them. I requested that she check her BP while we were on the phone and she agreed. Her BP had come down to 129/69. She is advised to check her BP once an hour today and if her BP starts to elevate to call me back. She verbalized understanding and is in agreement with plan. Will forward to Dr Irish Lack as Juluis Rainier.

## 2015-03-11 ENCOUNTER — Telehealth: Payer: Self-pay | Admitting: Interventional Cardiology

## 2015-03-11 NOTE — Telephone Encounter (Signed)
New message    Patient calling    Pt C/O BP issue:  1. What are your last 5 BP readings? Yesterday 125/69  / This am @ 7:30  150/75  / Retake 123/60 2. Are you having any other symptoms (ex. Dizziness, headache, blurred vision, passed out)? Weight gain 4 lbs  3. What is your medication issue? Does she need to do anything different.  Patient verbalize she taken the daily diuretics

## 2015-03-11 NOTE — Telephone Encounter (Signed)
Pt called because yesterday her BP was 125/69, this morning at 7:30 AM today  prior taken her medication  BP 150/75 after 1 and 1/2 hours  of taken her medication BP was 123/60 . Pt said that her BP usually runs in the 110/70. Pt also is concern that she has gain 4 lbs over 4 days pt denies edema , but she can feel the extra weight in her body. Pt takes Lasix 20 mg once daily, Carvedilol 6.25 mg twice a day and Diovan 40 mg daily in the evenings. Pt thinks that she needs to take something different then Furosemide 20 mg for her weigh gain.

## 2015-03-12 NOTE — Telephone Encounter (Signed)
OK to take Lasix 40 mg daily for the next 2 days and see how she feels.  If she feels that weight is better, she can go back to 20 mg daily.  Otherwise she can continue higher dose until Monday.

## 2015-03-16 NOTE — Telephone Encounter (Signed)
**Note De-Identified Sylvia Moore Obfuscation** The pt states that her weight continues to be 4 lbs greater than normal. Since this is Monday and the pt was not advised of Dr Hassell Done recommendations last week as I was on vacation she is advised today. She is advised to take 40 mg of Lasix daily X 2 days if she feels better to go back to 20 mg daily. Otherwise she can take higher dose until Thursday. She verbalized understanding and thanked m for calling her back.

## 2015-03-19 ENCOUNTER — Encounter: Payer: Self-pay | Admitting: Interventional Cardiology

## 2015-03-22 ENCOUNTER — Other Ambulatory Visit: Payer: Self-pay | Admitting: Internal Medicine

## 2015-03-31 NOTE — Telephone Encounter (Signed)
Gay Filler,  Please see the message regarding Corlanor.  Thanks.  JV

## 2015-04-02 ENCOUNTER — Ambulatory Visit (INDEPENDENT_AMBULATORY_CARE_PROVIDER_SITE_OTHER): Payer: BC Managed Care – PPO

## 2015-04-02 DIAGNOSIS — Z95810 Presence of automatic (implantable) cardiac defibrillator: Secondary | ICD-10-CM

## 2015-04-02 DIAGNOSIS — I5022 Chronic systolic (congestive) heart failure: Secondary | ICD-10-CM | POA: Diagnosis not present

## 2015-04-03 NOTE — Progress Notes (Signed)
OK to take an extra 20 mg of furosemide if she senses fluid overload.

## 2015-04-03 NOTE — Progress Notes (Signed)
EPIC Encounter for ICM Monitoring  Patient Name: Sylvia Moore is a 64 y.o. female Date: 04/03/2015 Primary Care Physican: Kandice Hams, MD Primary Cardiologist: Irish Lack Electrophysiologist: Allred Dry Weight: 213 lbs   Bi-V Pacing  >99%      In the past month, have you:  1. Gained more than 2 pounds in a day or more than 5 pounds in a week? Yes, about 3 pounds  2. Had changes in your medications (with verification of current medications)? no  3. Had more shortness of breath than is usual for you? no  4. Limited your activity because of shortness of breath? no  5. Not been able to sleep because of shortness of breath? no  6. Had increased swelling in your feet or ankles? Swelling in fingers  7. Had symptoms of dehydration (dizziness, dry mouth, increased thirst, decreased urine output) no  8. Had changes in sodium restriction? no  9. Been compliant with medication? Yes   ICM trend: 04/02/2015   Follow-up plan: ICM clinic phone appointment 04/21/2015.  Corvue daily impedance below baseline 03/23/2015 to 03/30/2015 suggesting fluid retention.  She reported weight gain and swelling in fingers during that time.  She was instructed by Dr Irish Lack to Furosemide 40 mg x 2 days and then resume Furosemide 20 mg daily which was effective for resolving most of the fluid at that time.  She reported her weight is still up a little so she took extra Furosemide in the last week.   Advised her to follow low sodium diet and fluid intake to about 64 oz daily.  Encouraged her to call the office is weight increases or she has other HF symptoms.  No changes today.   Copy of note sent to patient's primary care physician, primary cardiologist, and device following physician.  Rosalene Billings, RN, CCM 04/03/2015 10:28 AM

## 2015-04-06 NOTE — Progress Notes (Signed)
Patient notified of Dr Hassell Done recommendation to take extra Furosemide 20 mg if she senses fluid overload.  She reported she still has a weight gain 2-3 pounds and she had dry cough x 2 nights and having to sit up to breathe better.  She will take additional Furosemide for the next 2 days.  Advised if symptoms do not improve after taking extra med for 3-4 days or her symptoms worsen to call the office. Weight today 212 lbs.

## 2015-04-08 ENCOUNTER — Encounter: Payer: Self-pay | Admitting: Interventional Cardiology

## 2015-04-21 ENCOUNTER — Ambulatory Visit (INDEPENDENT_AMBULATORY_CARE_PROVIDER_SITE_OTHER): Payer: BC Managed Care – PPO

## 2015-04-21 DIAGNOSIS — Z95810 Presence of automatic (implantable) cardiac defibrillator: Secondary | ICD-10-CM

## 2015-04-21 DIAGNOSIS — I5022 Chronic systolic (congestive) heart failure: Secondary | ICD-10-CM

## 2015-04-22 NOTE — Progress Notes (Signed)
EPIC Encounter for ICM Monitoring  Patient Name: Sylvia Moore is a 64 y.o. female Date: 04/22/2015 Primary Care Physican: Kandice Hams, MD Primary Cardiologist: Irish Lack Electrophysiologist: Allred Dry Weight: 213 lbs  Bi-V Pacing >99%       In the past month, have you:  1. Gained more than 2 pounds in a day or more than 5 pounds in a week? Yes 2 lbs  2. Had changes in your medications (with verification of current medications)? no  3. Had more shortness of breath than is usual for you? no  4. Limited your activity because of shortness of breath? no  5. Not been able to sleep because of shortness of breath? no  6. Had increased swelling in your feet or ankles? no  7. Had symptoms of dehydration (dizziness, dry mouth, increased thirst, decreased urine output) no  8. Had changes in sodium restriction? no  9. Been compliant with medication? Yes   ICM trend: 3 month view   ICM Trend: 1 year view   Follow-up plan: ICM clinic phone appointment on 05/07/2015.  Corvue daily impedance above baseline 12/2 to 12/9, after increasing Furosemide dosage on 04/03/2015 suggesting medication was effective for fluid retention.  Impedance below baseline 04/11/2015 to 04/18/2015 suggesting fluid retention and she reported increase in SOB, 2 lb weight gain and hand swelling.  She reported she took a couple of extra Furosemide and impedance is back to baseline starting 04/19/2015.  She denied any current HF symptoms. She stated she thinks the fluid retention was from eating [place] food.   She has returned to low sodium diet.  Encouraged her to call for HF symptoms.  No changes today.  She confirmed she has the next ICM transmission date and no letter needed.   Copy of note sent to patient's primary care physician, primary cardiologist, and device following physician.  Rosalene Billings, RN, CCM 04/22/2015 3:30 PM

## 2015-04-23 ENCOUNTER — Other Ambulatory Visit: Payer: Self-pay

## 2015-04-23 MED ORDER — FUROSEMIDE 20 MG PO TABS: 20.0000 mg | ORAL_TABLET | Freq: Every day | ORAL | Status: DC

## 2015-04-29 ENCOUNTER — Telehealth: Payer: Self-pay

## 2015-04-29 NOTE — Telephone Encounter (Signed)
**Note De-Identified Moore Obfuscation** Called pharmacy ([facility]) to give clarification on Lasix dose. Per pt chart she should still get Lasix 20 MG daily not Lasix 20 MG 1-2 tablets daily. Explained if patient wanted to call us and discuss with Nurse/Dr about Lasix being increased, she can do that.  Sylvia Boston Via, LPN at 075-GRM 075-GRM PM     Status: Signed       Expand All Collapse All   The pt states that her weight continues to be 4 lbs greater than normal. Since this is Monday and the pt was not advised of Dr Hassell Done recommendations last week as I was on vacation she is advised today. She is advised to take 40 mg of Lasix daily X 2 days if she feels better to go back to 20 mg daily. Otherwise she can take higher dose until Thursday. She verbalized understanding and thanked m for calling her back.         Sylvia Billings, RN at 04/22/2015 3:29 PM  Follow-up plan: ICM clinic phone appointment on 05/07/2015. Corvue daily impedance above baseline 12/2 to 12/9, after increasing Furosemide dosage on 04/03/2015 suggesting medication was effective for fluid retention. Impedance below baseline 04/11/2015 to 04/18/2015 suggesting fluid retention and she reported increase in SOB, 2 lb weight gain and hand swelling. She reported she took a couple of extra Furosemide and impedance is back to baseline starting 04/19/2015. She denied any current HF symptoms. She stated she thinks the fluid retention was from eating [place] food. She has returned to low sodium diet. Encouraged her to call for HF symptoms. No changes today. She confirmed she has the next ICM transmission date and no letter needed.

## 2015-05-05 ENCOUNTER — Telehealth: Payer: Self-pay

## 2015-05-05 NOTE — Telephone Encounter (Signed)
Call to patient and she stated the monitor does not turn on it all.  She has tried several electrical outlets, reset button and the start button but nothing comes on.  Advised her to call [facility] and she stated she has the number.  ICM transmission changed from 05/07/2015 to 05/12/2015 to allow her more time to call the company and get it working.    Received voice mail from patient requesting a return call to assist with the monitor.

## 2015-05-06 ENCOUNTER — Telehealth: Payer: Self-pay | Admitting: Cardiology

## 2015-05-06 NOTE — Telephone Encounter (Signed)
Spoke w/ pt and requested that she send a remote transmission b/c her home monitor has not updated in at least 8 days. Pt verbalized understanding and stated that she is aware her home monitor is not working and that she is going to call [employer] tech support tomorrow.

## 2015-05-07 ENCOUNTER — Ambulatory Visit (INDEPENDENT_AMBULATORY_CARE_PROVIDER_SITE_OTHER): Payer: BC Managed Care – PPO | Admitting: *Deleted

## 2015-05-07 DIAGNOSIS — I428 Other cardiomyopathies: Secondary | ICD-10-CM

## 2015-05-07 DIAGNOSIS — I429 Cardiomyopathy, unspecified: Secondary | ICD-10-CM | POA: Diagnosis not present

## 2015-05-08 NOTE — Progress Notes (Signed)
Remote ICD transmission.   

## 2015-05-15 ENCOUNTER — Ambulatory Visit (INDEPENDENT_AMBULATORY_CARE_PROVIDER_SITE_OTHER): Payer: BC Managed Care – PPO

## 2015-05-15 DIAGNOSIS — I5022 Chronic systolic (congestive) heart failure: Secondary | ICD-10-CM

## 2015-05-15 DIAGNOSIS — Z95810 Presence of automatic (implantable) cardiac defibrillator: Secondary | ICD-10-CM

## 2015-05-15 NOTE — Progress Notes (Signed)
EPIC Encounter for ICM Monitoring  Patient Name: Sylvia Moore is a 65 y.o. female Date: 05/15/2015 Primary Care Physican: Kandice Hams, MD Primary Cardiologist: Irish Lack Electrophysiologist: Allred Dry Weight: 214 lbs  Bi-V Pacing >99%        In the past month, have you:  1. Gained more than 2 pounds in a day or more than 5 pounds in a week? no  2. Had changes in your medications (with verification of current medications)? no  3. Had more shortness of breath than is usual for you? no  4. Limited your activity because of shortness of breath? no  5. Not been able to sleep because of shortness of breath? no  6. Had increased swelling in your feet or ankles? no  7. Had symptoms of dehydration (dizziness, dry mouth, increased thirst, decreased urine output) no  8. Had changes in sodium restriction? no  9. Been compliant with medication? Yes   ICM trend: 1 year view 05/12/2015  ICM trend: 3 month view 05/12/2015   Follow-up plan: ICM clinic phone appointment on 06/17/2015.  CORVUE:  Daily impedance above reference impedance 05/04/2015 to 05/11/2015 suggesting dryness.  Encouraged her to drink fluids and she stated she is doing very well.  She denied any problems at this time.  Encouraged her to call for any HF symptoms.  No changes today.   Copy of note sent to patient's primary care physician, primary cardiologist, and device following physician.  Rosalene Billings, RN, CCM 05/15/2015 9:51 AM

## 2015-05-19 ENCOUNTER — Ambulatory Visit (INDEPENDENT_AMBULATORY_CARE_PROVIDER_SITE_OTHER): Payer: BC Managed Care – PPO

## 2015-05-19 DIAGNOSIS — I5022 Chronic systolic (congestive) heart failure: Secondary | ICD-10-CM

## 2015-05-19 DIAGNOSIS — Z95810 Presence of automatic (implantable) cardiac defibrillator: Secondary | ICD-10-CM

## 2015-05-19 NOTE — Progress Notes (Signed)
EPIC Encounter for ICM Monitoring  Patient Name: Sylvia Moore is a 65 y.o. female Date: 05/19/2015 Primary Care Physican: Kandice Hams, MD Primary Cardiologist: Irish Lack Electrophysiologist: Allred Dry Weight: 214 lbs  Bi-V Pacing 99%       In the past month, have you:  1. Gained more than 2 pounds in a day or more than 5 pounds in a week? no  2. Had changes in your medications (with verification of current medications)? no  3. Had more shortness of breath than is usual for you? no  4. Limited your activity because of shortness of breath? no  5. Not been able to sleep because of shortness of breath? no  6. Had increased swelling in your feet or ankles? no  7. Had symptoms of dehydration (dizziness, dry mouth, increased thirst, decreased urine output) Yes, patient complains of dizziness, lightheadedness  8. Had changes in sodium restriction? no  9. Been compliant with medication? Yes   ICM trend: 3 month view  ICM trend: 1 year view 05/19/2015   Follow-up plan: ICM clinic phone appointment 06/17/2015.  Received call from patient asking if she could send transmission to see if she is dehydrated because of dizziness, lightheadedness and off balance for the last few days.   Covue daily thoracic impedance below baseline 05/14/2015 to 05/15/2015 and 05/16/2015 - 05/17/2015 suggesting fluid retention.  She stated the dizziness is better today.  Explained she may have had dehydration 05/03/2017 to 05/11/2015 since impedance was above the baseline.  She has increased the fluids a little.  She discussed with Dr Irish Lack on 04/06/2016 and he thought it could be related to her Effexor which she takes 1/2 tablet bid.    Advised would send to Dr Irish Lack and Dr Rayann Heman for review and will call her back with any recommendations.     Copy of note sent to patient's primary care physician, primary cardiologist, and device following physician.  Rosalene Billings, RN, CCM 05/19/2015 3:30  PM   Reviewed all medications with [facility] Pharmacist, Tana Coast.  She stated if patient was having orthostatic hypotension the dizziness could be related to cardiac meds.  She stated she thought maybe the Effexor may be causing the dizziness.   Advised Dr Rayann Heman all meds were reviewed with pharmacist in relation to dizziness.   No further recommendations at this time and if her situation changes to call back.          Call to patient and advised reviewed meds with pharmacist and dizziness as a side effect is low percentage unless related to orthstatic hypotension.  Patient confirmed the dizziness is not related to position changes.  Recommended to       call PCP for follow up on dizziness.

## 2015-05-22 ENCOUNTER — Encounter (HOSPITAL_COMMUNITY): Payer: Self-pay | Admitting: *Deleted

## 2015-05-22 ENCOUNTER — Emergency Department (HOSPITAL_COMMUNITY)
Admission: EM | Admit: 2015-05-22 | Discharge: 2015-05-22 | Disposition: A | Discharge status: Home / Self Care | Payer: BC Managed Care – PPO | Attending: Emergency Medicine | Admitting: Emergency Medicine

## 2015-05-22 ENCOUNTER — Emergency Department (HOSPITAL_COMMUNITY): Payer: BC Managed Care – PPO

## 2015-05-22 DIAGNOSIS — Z8701 Personal history of pneumonia (recurrent): Secondary | ICD-10-CM | POA: Insufficient documentation

## 2015-05-22 DIAGNOSIS — Z87891 Personal history of nicotine dependence: Secondary | ICD-10-CM | POA: Diagnosis not present

## 2015-05-22 DIAGNOSIS — H9190 Unspecified hearing loss, unspecified ear: Secondary | ICD-10-CM | POA: Diagnosis not present

## 2015-05-22 DIAGNOSIS — K219 Gastro-esophageal reflux disease without esophagitis: Secondary | ICD-10-CM | POA: Insufficient documentation

## 2015-05-22 DIAGNOSIS — F329 Major depressive disorder, single episode, unspecified: Secondary | ICD-10-CM | POA: Insufficient documentation

## 2015-05-22 DIAGNOSIS — Z862 Personal history of diseases of the blood and blood-forming organs and certain disorders involving the immune mechanism: Secondary | ICD-10-CM | POA: Insufficient documentation

## 2015-05-22 DIAGNOSIS — R079 Chest pain, unspecified: Secondary | ICD-10-CM | POA: Diagnosis not present

## 2015-05-22 DIAGNOSIS — M109 Gout, unspecified: Secondary | ICD-10-CM | POA: Diagnosis not present

## 2015-05-22 DIAGNOSIS — E039 Hypothyroidism, unspecified: Secondary | ICD-10-CM | POA: Insufficient documentation

## 2015-05-22 DIAGNOSIS — Z8571 Personal history of Hodgkin lymphoma: Secondary | ICD-10-CM | POA: Insufficient documentation

## 2015-05-22 DIAGNOSIS — Z79899 Other long term (current) drug therapy: Secondary | ICD-10-CM | POA: Diagnosis not present

## 2015-05-22 DIAGNOSIS — I5022 Chronic systolic (congestive) heart failure: Secondary | ICD-10-CM | POA: Insufficient documentation

## 2015-05-22 LAB — I-STAT TROPONIN, ED: TROPONIN I, POC: 0.02 ng/mL (ref 0.00–0.08)

## 2015-05-22 LAB — CBC
HCT: 40.6 % (ref 36.0–46.0)
Hemoglobin: 13.8 g/dL (ref 12.0–15.0)
MCH: 34.5 pg — ABNORMAL HIGH (ref 26.0–34.0)
MCHC: 34 g/dL (ref 30.0–36.0)
MCV: 101.5 fL — ABNORMAL HIGH (ref 78.0–100.0)
PLATELETS: 378 10*3/uL (ref 150–400)
RBC: 4 MIL/uL (ref 3.87–5.11)
RDW: 13 % (ref 11.5–15.5)
WBC: 15.6 10*3/uL — AB (ref 4.0–10.5)

## 2015-05-22 LAB — BASIC METABOLIC PANEL
ANION GAP: 8 (ref 5–15)
BUN: 24 mg/dL — AB (ref 6–20)
CALCIUM: 9.9 mg/dL (ref 8.9–10.3)
CO2: 25 mmol/L (ref 22–32)
Chloride: 104 mmol/L (ref 101–111)
Creatinine, Ser: 1.05 mg/dL — ABNORMAL HIGH (ref 0.44–1.00)
GFR calc Af Amer: >60 mL/min (ref >60)
GFR, EST NON AFRICAN AMERICAN: 55 mL/min — AB (ref >60)
GLUCOSE: 86 mg/dL (ref 65–99)
POTASSIUM: 4 mmol/L (ref 3.5–5.1)
SODIUM: 137 mmol/L (ref 135–145)

## 2015-05-22 NOTE — ED Notes (Signed)
Pt reports mid-sternal cp today around 1600 while getting her hair done.  Pt reports SOB with exacerbation.  Pt reports hx of CHF.  Has an on-demand defib placed in March d/t complete heart block.  Pt is A&Ox 4.  Pt also reports dizziness which has been going on prior to onset of cp.

## 2015-05-22 NOTE — ED Provider Notes (Signed)
CSN: [id number]     Arrival date & time 05/22/15  1811 History   First MD Initiated Contact with Patient 05/22/15 2150     Chief Complaint  Patient presents with  . Chest Pain     (Consider location/radiation/quality/duration/timing/severity/associated sxs/prior Treatment) HPI   Sylvia Moore is a 65 y.o. female who presents for evaluation of chest pain, lower sternal region, which started at 3:30 PM today, and has persisted. The pain is worse with deep breathing. The pain is between 6 and 5/10. Pain is worse when supine, and improves with standing and walking. No associated shortness of breath, fever, chills, cough, weakness or dizziness. No trauma. No similar in the past. There are no other known modifying factors.   Past Medical History  Diagnosis Date  . Chronic systolic heart failure ([place])   . Depression   . Gout        . HOH (hard of hearing)     has hearing aids  . 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 Orthostatic hypotension        . NICM (nonischemic cardiomyopathy) ([place])     a. 2/2 radiation and chemotherapy b. echo 2/16  EF 20-25% c. non ischemic myoview 5/14 d. s/p STJ CRTD  . GERD (gastroesophageal reflux disease)   . LBBB (left bundle branch block)   . Pneumonia 1960's X 1; 2014  . Hypothyroidism     a. s/p thyroid radiation  . History of blood transfusion ~ 1976    "related to cyst on my lung"  . Hepatitis ~ 1978    "got hepatitis w/mono; don't know which kind"  . Hodgkin's lymphoma ([place])     a. s/p rad' and chemo  in 1975-1977 b. s/p splenectomy 1970's   Past Surgical History  Procedure Laterality Date  . Hysteroscopy w/d&c    . Colonoscopy w/ polypectomy    . Lumbar fusion  07/2011    T11-L1 posterior lateral  . Dilation and curettage of uterus    . Abdominal exploration surgery  1975    r/t hodgkins disease - had chemo and rad  . Knee arthroscopy Left   . Colposcopy N/A 08/14/2012    Procedure: COLPOSCOPY;  Surgeon: [name], MD;   Location: [facility];  Service: Gynecology;  Laterality: N/A;  Colpo of Vulva and Vagina  . Lesion removal N/A 08/14/2012    Procedure: EXCISION VAGINAL LESION;  Surgeon: [name], MD;  Location: [facility];  Service: Gynecology;  Laterality: N/A;  Wide Local Excision of Right Labia Majori  . Rectal biopsy N/A 08/14/2012    Procedure: BIOPSY RECTAL;  Surgeon: [name], MD;  Location: [facility];  Service: Gynecology;  Laterality: N/A;  peri-anal biopsy  . Esophagogastroduodenoscopy (egd) with propofol N/A 01/20/2014    Procedure: ESOPHAGOGASTRODUODENOSCOPY (EGD) WITH PROPOFOL;  Surgeon: [name], MD;  Location: [facility];  Service: Endoscopy;  Laterality: N/A;  . Colonoscopy with propofol N/A 01/20/2014    Procedure: COLONOSCOPY WITH PROPOFOL;  Surgeon: [name], MD;  Location: [facility];  Service: Endoscopy;  Laterality: N/A;  . Knee arthroscopy Right 2015    "torn meniscus"  . Left heart catheterization with coronary angiogram N/A 07/01/2014    Procedure: LEFT HEART CATHETERIZATION WITH CORONARY ANGIOGRAM;  Surgeon: [name], MD;  Location: [facility];  Service: Cardiovascular;  Laterality: N/A;  . Back surgery    . Laparoscopic cholecystectomy  ~ 2005  . Neck lesion biopsy  Right 1975  . Splenectomy  1970's  . Bi-ventricular implantable cardioverter defibrillator N/A 07/08/2014    STJ Quadra Assura CRTD implanted by Dr Rayann Heman   Family History  Problem Relation Age of Onset  . Hypertension Father   . Stroke Father   . Heart failure Father   . Pneumonia Mother   . Healthy Sister   . Healthy Brother    Social History  Substance Use Topics  . Smoking status: Former Smoker -- 0.25 packs/day for 1 years    Types: Cigarettes    Quit date: 05/02/1968  . Smokeless tobacco: Never Used  . Alcohol Use: 1.8 oz/week    3 Shots of liquor per week     Comment: weekends; if that   OB History    Gravida Para Term Preterm AB TAB SAB Ectopic Multiple Living   1 0              Review of Systems  All other systems reviewed and are negative.     Allergies  Codeine  Home Medications   Prior to Admission medications   Medication Sig Start Date End Date Taking? Authorizing Provider  allopurinol (ZYLOPRIM) 100 MG tablet Take 100 mg by mouth 2 (two) times daily.   Yes Historical Provider, MD  carvedilol (COREG) 12.5 MG tablet Take 0.5 tablets (6.25 mg total) by mouth 2 (two) times daily with a meal. 06/21/14  Yes Eileen Stanford, PA-C  CORLANOR 5 MG TABS tablet TAKE 1 & 1/2 TABLETS TWICE DAILY WITH MEALS. 03/23/15  Yes Thompson Grayer, MD  furosemide (LASIX) 20 MG tablet Take 1 tablet (20 mg total) by mouth daily. 04/23/15  Yes [name], MD  levothyroxine (SYNTHROID, LEVOTHROID) 125 MCG tablet Take 125 mcg by mouth daily. Reported on 05/22/2015   Yes Historical Provider, MD  Multiple Vitamins-Minerals (MULTIVITAMIN PO) Take 1 tablet by mouth daily.   Yes Historical Provider, MD  omeprazole (PRILOSEC OTC) 20 MG tablet Take 20 mg by mouth at bedtime.    Yes Historical Provider, MD  valsartan (DIOVAN) 40 MG tablet Take 1 tablet (40 mg total) by mouth daily. 08/25/14  Yes Amber Sena Slate, NP  venlafaxine XR (EFFEXOR-XR) 37.5 MG 24 hr capsule Take 37.5 mg by mouth 2 (two) times daily.   Yes Historical Provider, MD  nitroGLYCERIN (NITROSTAT) 0.4 MG SL tablet Place 1 tablet (0.4 mg total) under the tongue every 5 (five) minutes as needed for chest pain. 06/28/14   Rhonda G Barrett, PA-C   BP 136/59 mmHg  Pulse 76  Temp(Src) 97.7 F (36.5 C) (Oral)  Resp 21  SpO2 99% Physical Exam  Constitutional: She is oriented to person, place, and time. She appears well-developed and well-nourished. No distress.  HENT:  Head: Normocephalic and atraumatic.  Right Ear: External ear normal.  Left Ear: External ear normal.  Eyes: Conjunctivae and EOM are normal. Pupils are equal, round, and reactive to light.  Neck: Normal range of motion and phonation normal. Neck supple.   Cardiovascular: Normal rate, regular rhythm and normal heart sounds.   Pulmonary/Chest: Effort normal and breath sounds normal. She exhibits no tenderness and no bony tenderness.  Abdominal: Soft. There is no tenderness.  Musculoskeletal: Normal range of motion. She exhibits no edema or tenderness.  Neurological: She is alert and oriented to person, place, and time. No cranial nerve deficit or sensory deficit. She exhibits normal muscle tone. Coordination normal.  Skin: Skin is warm, dry and intact.  Psychiatric: She has a  normal mood and affect. Her behavior is normal. Judgment and thought content normal.  Nursing note and vitals reviewed.   ED Course  Procedures (including critical care time) Labs Review Labs Reviewed  BASIC METABOLIC PANEL - Abnormal; Notable for the following:    BUN 24 (*)    Creatinine, Ser 1.05 (*)    GFR calc non Af Amer 55 (*)    All other components within normal limits  CBC - Abnormal; Notable for the following:    WBC 15.6 (*)    MCV 101.5 (*)    MCH 34.5 (*)    All other components within normal limits  I-STAT TROPOININ, ED    Imaging Review Dg Chest 2 View  05/22/2015  CLINICAL DATA:  Shortness of breath and chest pain for several hours EXAM: CHEST - 2 VIEW COMPARISON:  07/09/2014 FINDINGS: Cardiac shadow is stable. A pacing device is again seen and stable. Postsurgical changes at the thoracolumbar junction are noted. The lungs are clear. No acute infiltrate is seen. No acute bony abnormality is noted. IMPRESSION: No acute abnormality seen. Electronically Signed   By: Inez Catalina M.D.   On: 05/22/2015 19:00   I have personally reviewed and evaluated these images and lab results as part of my medical decision-making.   EKG Interpretation   Date/Time:  Friday May 22 2015 18:18:46 EST Ventricular Rate:  79 PR Interval:  185 QRS Duration: 115 QT Interval:  433 QTC Calculation: 496 R Axis:   -78 Text Interpretation:  Atrial-sensed  ventricular-paced complexes No further  analysis attempted due to paced rhythm Baseline wander in lead(s) V3 since  last tracing no significant change Confirmed by Leyanna Bittman  MD, Szymon Foiles [id number])  on 05/22/2015 9:52:23 PM      MDM   Final diagnoses:  Nonspecific chest pain    Nonspecific chest pain, low risk for coronary disease. Possible pericarditis. Doubt PE or pneumonia.   Nursing Notes Reviewed/ Care Coordinated Applicable Imaging Reviewed Interpretation of Laboratory Data incorporated into ED treatment  The patient appears reasonably screened and/or stabilized for discharge and I doubt any other medical condition or other [facility] requiring further screening, evaluation, or treatment in the ED at this time prior to discharge.  Plan: Home Medications- IBU; Home Treatments- rest; return here if the recommended treatment, does not improve the symptoms; Recommended follow up- Cardiology on Monday, 05/25/15, and prn   Daleen Bo, MD 05/22/15 2227

## 2015-05-22 NOTE — Discharge Instructions (Signed)
Use ibuprofen, 400 mg 3 times a day with meals for 5 days. Try using heat in your chest. Return here if needed, for problems.   Nonspecific Chest Pain  Chest pain can be caused by many different conditions. There is always a chance that your pain could be related to something serious, such as a heart attack or a blood clot in your lungs. Chest pain can also be caused by conditions that are not life-threatening. If you have chest pain, it is very important to follow up with your health care provider. CAUSES  Chest pain can be caused by:  Heartburn.  Pneumonia or bronchitis.  Anxiety or stress.  Inflammation around your heart (pericarditis) or lung (pleuritis or pleurisy).  A blood clot in your lung.  A collapsed lung (pneumothorax). It can develop suddenly on its own (spontaneous pneumothorax) or from trauma to the chest.  Shingles infection (varicella-zoster virus).  Heart attack.  Damage to the bones, muscles, and cartilage that make up your chest wall. This can include:  Bruised bones due to injury.  Strained muscles or cartilage due to frequent or repeated coughing or overwork.  Fracture to one or more ribs.  Sore cartilage due to inflammation (costochondritis). RISK FACTORS  Risk factors for chest pain may include:  Activities that increase your risk for trauma or injury to your chest.  Respiratory infections or conditions that cause frequent coughing.  Medical conditions or overeating that can cause heartburn.  Heart disease or family history of heart disease.  Conditions or health behaviors that increase your risk of developing a blood clot.  Having had chicken pox (varicella zoster). SIGNS AND SYMPTOMS Chest pain can feel like:  Burning or tingling on the surface of your chest or deep in your chest.  Crushing, pressure, aching, or squeezing pain.  Dull or sharp pain that is worse when you move, cough, or take a deep breath.  Pain that is also felt in  your back, neck, shoulder, or arm, or pain that spreads to any of these areas. Your chest pain may come and go, or it may stay constant. DIAGNOSIS Lab tests or other studies may be needed to find the cause of your pain. Your health care provider may have you take a test called an ambulatory ECG (electrocardiogram). An ECG records your heartbeat patterns at the time the test is performed. You may also have other tests, such as:  Transthoracic echocardiogram (TTE). During echocardiography, sound waves are used to create a picture of all of the heart structures and to look at how blood flows through your heart.  Transesophageal echocardiogram (TEE).This is a more advanced imaging test that obtains images from inside your body. It allows your health care provider to see your heart in finer detail.  Cardiac monitoring. This allows your health care provider to monitor your heart rate and rhythm in real time.  Holter monitor. This is a portable device that records your heartbeat and can help to diagnose abnormal heartbeats. It allows your health care provider to track your heart activity for several days, if needed.  Stress tests. These can be done through exercise or by taking medicine that makes your heart beat more quickly.  Blood tests.  Imaging tests. TREATMENT  Your treatment depends on what is causing your chest pain. Treatment may include:  Medicines. These may include:  Acid blockers for heartburn.  Anti-inflammatory medicine.  Pain medicine for inflammatory conditions.  Antibiotic medicine, if an infection is present.  Medicines to dissolve  blood clots.  Medicines to treat coronary artery disease.  Supportive care for conditions that do not require medicines. This may include:  Resting.  Applying heat or cold packs to injured areas.  Limiting activities until pain decreases. HOME CARE INSTRUCTIONS  If you were prescribed an antibiotic medicine, finish it all even if  you start to feel better.  Avoid any activities that bring on chest pain.  Do not use any tobacco products, including cigarettes, chewing tobacco, or electronic cigarettes. If you need help quitting, ask your health care provider.  Do not drink alcohol.  Take medicines only as directed by your health care provider.  Keep all follow-up visits as directed by your health care provider. This is important. This includes any further testing if your chest pain does not go away.  If heartburn is the cause for your chest pain, you may be told to keep your head raised (elevated) while sleeping. This reduces the chance that acid will go from your stomach into your esophagus.  Make lifestyle changes as directed by your health care provider. These may include:  Getting regular exercise. Ask your health care provider to suggest some activities that are safe for you.  Eating a heart-healthy diet. A registered dietitian can help you to learn healthy eating options.  Maintaining a healthy weight.  Managing diabetes, if necessary.  Reducing stress. SEEK MEDICAL CARE IF:  Your chest pain does not go away after treatment.  You have a rash with blisters on your chest.  You have a fever. SEEK IMMEDIATE MEDICAL CARE IF:   Your chest pain is worse.  You have an increasing cough, or you cough up blood.  You have severe abdominal pain.  You have severe weakness.  You faint.  You have chills.  You have sudden, unexplained chest discomfort.  You have sudden, unexplained discomfort in your arms, back, neck, or jaw.  You have shortness of breath at any time.  You suddenly start to sweat, or your skin gets clammy.  You feel nauseous or you vomit.  You suddenly feel light-headed or dizzy.  Your heart begins to beat quickly, or it feels like it is skipping beats. These symptoms may represent a serious problem that is an emergency. Do not wait to see if the symptoms will go away. Get medical  help right away. Call your local emergency services (911 in the [place] Do not drive yourself to the hospital.   This information is not intended to replace advice given to you by your health care provider. Make sure you discuss any questions you have with your health care provider.   Document Released: 01/26/2005 Document Revised: 05/09/2014 Document Reviewed: 11/22/2013 Elsevier Interactive Patient Education [employer].

## 2015-05-22 NOTE — ED Notes (Signed)
Patient d/c'd self care with family.  F/U discussed.  Patient verbalized understanding.

## 2015-05-22 NOTE — ED Notes (Signed)
Bed: WA06 Expected date:  Expected time:  Means of arrival:  Comments: Triage 5

## 2015-05-25 ENCOUNTER — Other Ambulatory Visit: Payer: Self-pay

## 2015-05-25 DIAGNOSIS — Z1231 Encounter for screening mammogram for malignant neoplasm of breast: Secondary | ICD-10-CM

## 2015-05-26 LAB — CUP PACEART REMOTE DEVICE CHECK
Battery Remaining Longevity: 76 mo
Battery Remaining Percentage: 83 %
Battery Voltage: 3.02 V
HIGH POWER IMPEDANCE MEASURED VALUE: 73 Ohm
HIGH POWER IMPEDANCE MEASURED VALUE: 73 Ohm
Implantable Lead Implant Date: 20160308
Implantable Lead Implant Date: 20160308
Implantable Lead Location: 753857
Implantable Lead Location: 753860
Lead Channel Impedance Value: 1300 Ohm
Lead Channel Impedance Value: 590 Ohm
Lead Channel Pacing Threshold Pulse Width: 0.5 ms
Lead Channel Sensing Intrinsic Amplitude: 12 mV
Lead Channel Setting Pacing Amplitude: 2 V
Lead Channel Setting Pacing Amplitude: 2.25 V
MDC IDC LEAD IMPLANT DT: 20160308
MDC IDC LEAD LOCATION: 753859
MDC IDC MSMT LEADCHNL LV PACING THRESHOLD AMPLITUDE: 1.25 V
MDC IDC MSMT LEADCHNL LV PACING THRESHOLD PULSEWIDTH: 0.7 ms
MDC IDC MSMT LEADCHNL RA IMPEDANCE VALUE: 490 Ohm
MDC IDC MSMT LEADCHNL RA PACING THRESHOLD AMPLITUDE: 1 V
MDC IDC MSMT LEADCHNL RA SENSING INTR AMPL: 3.6 mV
MDC IDC MSMT LEADCHNL RV PACING THRESHOLD AMPLITUDE: 0.625 V
MDC IDC MSMT LEADCHNL RV PACING THRESHOLD PULSEWIDTH: 0.5 ms
MDC IDC SESS DTM: 20170106041555
MDC IDC SET LEADCHNL LV PACING PULSEWIDTH: 0.7 ms
MDC IDC SET LEADCHNL RV PACING AMPLITUDE: 2 V
MDC IDC SET LEADCHNL RV PACING PULSEWIDTH: 0.5 ms
MDC IDC SET LEADCHNL RV SENSING SENSITIVITY: 0.5 mV
MDC IDC STAT BRADY AP VP PERCENT: 1.2 %
MDC IDC STAT BRADY AP VS PERCENT: 1 %
MDC IDC STAT BRADY AS VP PERCENT: 98 %
MDC IDC STAT BRADY AS VS PERCENT: 1 %
MDC IDC STAT BRADY RA PERCENT PACED: 1 %
Pulse Gen Serial Number: 7204299

## 2015-05-29 ENCOUNTER — Encounter: Payer: Self-pay | Admitting: Cardiology

## 2015-06-17 ENCOUNTER — Ambulatory Visit (INDEPENDENT_AMBULATORY_CARE_PROVIDER_SITE_OTHER): Payer: BC Managed Care – PPO

## 2015-06-17 DIAGNOSIS — Z95810 Presence of automatic (implantable) cardiac defibrillator: Secondary | ICD-10-CM | POA: Diagnosis not present

## 2015-06-17 DIAGNOSIS — I5022 Chronic systolic (congestive) heart failure: Secondary | ICD-10-CM | POA: Diagnosis not present

## 2015-06-18 ENCOUNTER — Telehealth: Payer: Self-pay

## 2015-06-18 NOTE — Telephone Encounter (Signed)
Remote ICM transmission received.  Attempted patient call and left message for return call.   

## 2015-06-19 NOTE — Telephone Encounter (Signed)
Spoke with patient.

## 2015-06-19 NOTE — Progress Notes (Signed)
EPIC Encounter for ICM Monitoring  Patient Name: Sylvia Moore is a 65 y.o. female Date: 06/19/2015 Primary Care Physican: Kandice Hams, MD Primary Cardiologist: Irish Lack Electrophysiologist: Allred Dry Weight: 210 lbs  Bi-V Pacing 99%       In the past month, have you:  1. Gained more than 2 pounds in a day or more than 5 pounds in a week? no  2. Had changes in your medications (with verification of current medications)? no  3. Had more shortness of breath than is usual for you? no  4. Limited your activity because of shortness of breath? no  5. Not been able to sleep because of shortness of breath? no  6. Had increased swelling in your feet or ankles? no  7. Had symptoms of dehydration (dizziness, dry mouth, increased thirst, decreased urine output) no  8. Had changes in sodium restriction? no  9. Been compliant with medication? Yes   ICM trend: 3 month view for 06/17/2015   ICM trend: 1 year view for 06/17/2015   Follow-up plan: ICM clinic phone appointment on 07/22/2015.  [place]vue thoracic impedance below reference line 05/22/2015 to 05/28/2015 and 05/30/2015 to 06/09/2015 suggesting fluid accumulation.  Thoracic impedance above reference line 06/09/2015 to 06/16/2015.  She reported the fluid accumulation may have been due to she had pleurisy and went to ED on 05/22/2015 and she did not take Lasix for a couple of days due to a new job.  She is working 8-10 hours a day for 7 weeks.  She stated she is not drinking as much fluid as she normally does.  She stated she is out of her routine and this may be reason for the changes.  She stated she feels fine and is doing well.   No changes today, thoracic impedance returned to reference line 06/17/2015.   Encouraged her to stay hydrated and try to establish a new routine while she is working.    Copy of note sent to patient's primary care physician, primary cardiologist, and device following physician.  Rosalene Billings, RN,  CCM 06/19/2015 9:50 AM

## 2015-07-13 ENCOUNTER — Ambulatory Visit
Admission: RE | Admit: 2015-07-13 | Discharge: 2015-07-13 | Disposition: A | Discharge status: Home / Self Care | Payer: BC Managed Care – PPO | Source: Ambulatory Visit

## 2015-07-13 DIAGNOSIS — Z1231 Encounter for screening mammogram for malignant neoplasm of breast: Secondary | ICD-10-CM

## 2015-07-14 ENCOUNTER — Other Ambulatory Visit: Payer: Self-pay | Admitting: Physician Assistant

## 2015-07-15 ENCOUNTER — Other Ambulatory Visit: Payer: Self-pay | Admitting: Physician Assistant

## 2015-07-17 ENCOUNTER — Other Ambulatory Visit: Payer: Self-pay | Admitting: Nurse Practitioner

## 2015-07-22 ENCOUNTER — Ambulatory Visit (INDEPENDENT_AMBULATORY_CARE_PROVIDER_SITE_OTHER): Payer: BC Managed Care – PPO

## 2015-07-22 DIAGNOSIS — Z95810 Presence of automatic (implantable) cardiac defibrillator: Secondary | ICD-10-CM | POA: Diagnosis not present

## 2015-07-22 DIAGNOSIS — I5022 Chronic systolic (congestive) heart failure: Secondary | ICD-10-CM | POA: Diagnosis not present

## 2015-07-22 NOTE — Progress Notes (Signed)
EPIC Encounter for ICM Monitoring  Patient Name: Sylvia Moore is a 65 y.o. female Date: 07/22/2015 Primary Care Physican: Kandice Hams, MD Primary Cardiologist: Irish Lack Electrophysiologist: Allred Dry Weight: 204 lb   Bi-V Pacing 99%      In the past month, have you:  1. Gained more than 2 pounds in a day or more than 5 pounds in a week? no  2. Had changes in your medications (with verification of current medications)? no  3. Had more shortness of breath than is usual for you? no  4. Limited your activity because of shortness of breath? no  5. Not been able to sleep because of shortness of breath? no  6. Had increased swelling in your feet or ankles? no  7. Had symptoms of dehydration (dizziness, dry mouth, increased thirst, decreased urine output) no  8. Had changes in sodium restriction? no  9. Been compliant with medication? Yes   ICM trend: 3 month view for 07/22/2015   ICM trend: 1 year view for 07/22/2015   Follow-up plan: ICM clinic phone appointment on 08/25/2015.  Thoracic impedance above reference line from 07/06/2015 to 07/16/2015 suggesting dryness.  Patient denied any dehydration symptoms.  She stated she knows she does not always drink enough fluids.  Patient currently follow [employer] and losing weight.   Encouraged to call for any fluid symptoms.  No changes today.     Rosalene Billings, RN, CCM 07/22/2015 2:27 PM

## 2015-08-06 ENCOUNTER — Ambulatory Visit: Payer: BC Managed Care – PPO | Admitting: *Deleted

## 2015-08-06 NOTE — Progress Notes (Signed)
Remote ICD transmission.   

## 2015-08-18 ENCOUNTER — Emergency Department (HOSPITAL_COMMUNITY): Payer: BC Managed Care – PPO

## 2015-08-18 ENCOUNTER — Inpatient Hospital Stay (HOSPITAL_COMMUNITY): Payer: BC Managed Care – PPO | Admitting: Certified Registered Nurse Anesthetist

## 2015-08-18 ENCOUNTER — Inpatient Hospital Stay (HOSPITAL_COMMUNITY): Payer: BC Managed Care – PPO

## 2015-08-18 ENCOUNTER — Encounter (HOSPITAL_COMMUNITY)
Admission: EM | Disposition: A | Discharge status: Hospice | Payer: Self-pay | Source: Home / Self Care | Attending: Internal Medicine

## 2015-08-18 ENCOUNTER — Encounter (HOSPITAL_COMMUNITY): Payer: Self-pay | Admitting: Radiology

## 2015-08-18 ENCOUNTER — Inpatient Hospital Stay (HOSPITAL_COMMUNITY)
Admission: EM | Admit: 2015-08-18 | Discharge: 2015-09-01 | DRG: 023 | Disposition: A | Discharge status: Hospice | Payer: BC Managed Care – PPO | Attending: Pulmonary Disease | Admitting: Pulmonary Disease

## 2015-08-18 DIAGNOSIS — I5022 Chronic systolic (congestive) heart failure: Secondary | ICD-10-CM | POA: Diagnosis present

## 2015-08-18 DIAGNOSIS — B9620 Unspecified Escherichia coli [E. coli] as the cause of diseases classified elsewhere: Secondary | ICD-10-CM | POA: Diagnosis not present

## 2015-08-18 DIAGNOSIS — D72829 Elevated white blood cell count, unspecified: Secondary | ICD-10-CM | POA: Diagnosis present

## 2015-08-18 DIAGNOSIS — R402362 Coma scale, best motor response, obeys commands, at arrival to emergency department: Secondary | ICD-10-CM | POA: Diagnosis present

## 2015-08-18 DIAGNOSIS — N390 Urinary tract infection, site not specified: Secondary | ICD-10-CM | POA: Diagnosis not present

## 2015-08-18 DIAGNOSIS — R402212 Coma scale, best verbal response, none, at arrival to emergency department: Secondary | ICD-10-CM | POA: Diagnosis present

## 2015-08-18 DIAGNOSIS — R9089 Other abnormal findings on diagnostic imaging of central nervous system: Secondary | ICD-10-CM

## 2015-08-18 DIAGNOSIS — R402142 Coma scale, eyes open, spontaneous, at arrival to emergency department: Secondary | ICD-10-CM | POA: Diagnosis present

## 2015-08-18 DIAGNOSIS — Z885 Allergy status to narcotic agent status: Secondary | ICD-10-CM | POA: Diagnosis not present

## 2015-08-18 DIAGNOSIS — G8191 Hemiplegia, unspecified affecting right dominant side: Secondary | ICD-10-CM | POA: Diagnosis present

## 2015-08-18 DIAGNOSIS — E669 Obesity, unspecified: Secondary | ICD-10-CM | POA: Diagnosis present

## 2015-08-18 DIAGNOSIS — Z79899 Other long term (current) drug therapy: Secondary | ICD-10-CM | POA: Diagnosis not present

## 2015-08-18 DIAGNOSIS — F329 Major depressive disorder, single episode, unspecified: Secondary | ICD-10-CM | POA: Diagnosis present

## 2015-08-18 DIAGNOSIS — G935 Compression of brain: Secondary | ICD-10-CM | POA: Diagnosis present

## 2015-08-18 DIAGNOSIS — H9190 Unspecified hearing loss, unspecified ear: Secondary | ICD-10-CM | POA: Diagnosis present

## 2015-08-18 DIAGNOSIS — Z515 Encounter for palliative care: Secondary | ICD-10-CM

## 2015-08-18 DIAGNOSIS — I110 Hypertensive heart disease with heart failure: Secondary | ICD-10-CM | POA: Diagnosis present

## 2015-08-18 DIAGNOSIS — R1310 Dysphagia, unspecified: Secondary | ICD-10-CM

## 2015-08-18 DIAGNOSIS — I6200 Nontraumatic subdural hemorrhage, unspecified: Secondary | ICD-10-CM | POA: Diagnosis not present

## 2015-08-18 DIAGNOSIS — Z978 Presence of other specified devices: Secondary | ICD-10-CM

## 2015-08-18 DIAGNOSIS — R061 Stridor: Secondary | ICD-10-CM | POA: Diagnosis not present

## 2015-08-18 DIAGNOSIS — I611 Nontraumatic intracerebral hemorrhage in hemisphere, cortical: Secondary | ICD-10-CM | POA: Diagnosis not present

## 2015-08-18 DIAGNOSIS — E876 Hypokalemia: Secondary | ICD-10-CM | POA: Diagnosis not present

## 2015-08-18 DIAGNOSIS — R900 Intracranial space-occupying lesion found on diagnostic imaging of central nervous system: Secondary | ICD-10-CM

## 2015-08-18 DIAGNOSIS — Z9081 Acquired absence of spleen: Secondary | ICD-10-CM | POA: Diagnosis not present

## 2015-08-18 DIAGNOSIS — R4701 Aphasia: Secondary | ICD-10-CM | POA: Diagnosis present

## 2015-08-18 DIAGNOSIS — Z6831 Body mass index (BMI) 31.0-31.9, adult: Secondary | ICD-10-CM

## 2015-08-18 DIAGNOSIS — E039 Hypothyroidism, unspecified: Secondary | ICD-10-CM | POA: Diagnosis present

## 2015-08-18 DIAGNOSIS — Z23 Encounter for immunization: Secondary | ICD-10-CM | POA: Diagnosis not present

## 2015-08-18 DIAGNOSIS — D6489 Other specified anemias: Secondary | ICD-10-CM | POA: Diagnosis not present

## 2015-08-18 DIAGNOSIS — R402234 Coma scale, best verbal response, inappropriate words, 24 hours or more after hospital admission: Secondary | ICD-10-CM | POA: Diagnosis not present

## 2015-08-18 DIAGNOSIS — R569 Unspecified convulsions: Secondary | ICD-10-CM | POA: Diagnosis present

## 2015-08-18 DIAGNOSIS — F1290 Cannabis use, unspecified, uncomplicated: Secondary | ICD-10-CM | POA: Diagnosis present

## 2015-08-18 DIAGNOSIS — J9600 Acute respiratory failure, unspecified whether with hypoxia or hypercapnia: Secondary | ICD-10-CM

## 2015-08-18 DIAGNOSIS — D334 Benign neoplasm of spinal cord: Secondary | ICD-10-CM | POA: Diagnosis present

## 2015-08-18 DIAGNOSIS — Z981 Arthrodesis status: Secondary | ICD-10-CM | POA: Diagnosis not present

## 2015-08-18 DIAGNOSIS — I429 Cardiomyopathy, unspecified: Secondary | ICD-10-CM | POA: Diagnosis present

## 2015-08-18 DIAGNOSIS — Z8572 Personal history of non-Hodgkin lymphomas: Secondary | ICD-10-CM | POA: Diagnosis not present

## 2015-08-18 DIAGNOSIS — I629 Nontraumatic intracranial hemorrhage, unspecified: Secondary | ICD-10-CM

## 2015-08-18 DIAGNOSIS — G936 Cerebral edema: Secondary | ICD-10-CM | POA: Diagnosis present

## 2015-08-18 DIAGNOSIS — I609 Nontraumatic subarachnoid hemorrhage, unspecified: Secondary | ICD-10-CM | POA: Diagnosis present

## 2015-08-18 DIAGNOSIS — G9340 Encephalopathy, unspecified: Secondary | ICD-10-CM | POA: Diagnosis present

## 2015-08-18 DIAGNOSIS — R402344 Coma scale, best motor response, flexion withdrawal, 24 hours or more after hospital admission: Secondary | ICD-10-CM | POA: Diagnosis not present

## 2015-08-18 DIAGNOSIS — K117 Disturbances of salivary secretion: Secondary | ICD-10-CM

## 2015-08-18 DIAGNOSIS — Z66 Do not resuscitate: Secondary | ICD-10-CM | POA: Diagnosis not present

## 2015-08-18 DIAGNOSIS — E785 Hyperlipidemia, unspecified: Secondary | ICD-10-CM | POA: Diagnosis present

## 2015-08-18 DIAGNOSIS — R739 Hyperglycemia, unspecified: Secondary | ICD-10-CM | POA: Diagnosis not present

## 2015-08-18 DIAGNOSIS — I82402 Acute embolism and thrombosis of unspecified deep veins of left lower extremity: Secondary | ICD-10-CM

## 2015-08-18 DIAGNOSIS — R29722 NIHSS score 22: Secondary | ICD-10-CM | POA: Diagnosis present

## 2015-08-18 DIAGNOSIS — J069 Acute upper respiratory infection, unspecified: Secondary | ICD-10-CM

## 2015-08-18 DIAGNOSIS — T380X5A Adverse effect of glucocorticoids and synthetic analogues, initial encounter: Secondary | ICD-10-CM | POA: Diagnosis not present

## 2015-08-18 DIAGNOSIS — Z8571 Personal history of Hodgkin lymphoma: Secondary | ICD-10-CM

## 2015-08-18 DIAGNOSIS — J988 Other specified respiratory disorders: Secondary | ICD-10-CM | POA: Diagnosis not present

## 2015-08-18 DIAGNOSIS — I255 Ischemic cardiomyopathy: Secondary | ICD-10-CM | POA: Diagnosis not present

## 2015-08-18 DIAGNOSIS — R509 Fever, unspecified: Secondary | ICD-10-CM | POA: Diagnosis not present

## 2015-08-18 DIAGNOSIS — Z87891 Personal history of nicotine dependence: Secondary | ICD-10-CM

## 2015-08-18 DIAGNOSIS — R402114 Coma scale, eyes open, never, 24 hours or more after hospital admission: Secondary | ICD-10-CM | POA: Diagnosis not present

## 2015-08-18 DIAGNOSIS — J9589 Other postprocedural complications and disorders of respiratory system, not elsewhere classified: Secondary | ICD-10-CM | POA: Insufficient documentation

## 2015-08-18 DIAGNOSIS — I639 Cerebral infarction, unspecified: Secondary | ICD-10-CM | POA: Diagnosis not present

## 2015-08-18 DIAGNOSIS — N3000 Acute cystitis without hematuria: Secondary | ICD-10-CM | POA: Diagnosis not present

## 2015-08-18 DIAGNOSIS — I6789 Other cerebrovascular disease: Secondary | ICD-10-CM | POA: Diagnosis not present

## 2015-08-18 DIAGNOSIS — K219 Gastro-esophageal reflux disease without esophagitis: Secondary | ICD-10-CM | POA: Diagnosis present

## 2015-08-18 DIAGNOSIS — J9690 Respiratory failure, unspecified, unspecified whether with hypoxia or hypercapnia: Secondary | ICD-10-CM

## 2015-08-18 DIAGNOSIS — J9601 Acute respiratory failure with hypoxia: Secondary | ICD-10-CM | POA: Diagnosis not present

## 2015-08-18 DIAGNOSIS — I63412 Cerebral infarction due to embolism of left middle cerebral artery: Secondary | ICD-10-CM | POA: Diagnosis not present

## 2015-08-18 DIAGNOSIS — M109 Gout, unspecified: Secondary | ICD-10-CM | POA: Diagnosis present

## 2015-08-18 DIAGNOSIS — G939 Disorder of brain, unspecified: Secondary | ICD-10-CM | POA: Diagnosis not present

## 2015-08-18 DIAGNOSIS — I619 Nontraumatic intracerebral hemorrhage, unspecified: Principal | ICD-10-CM | POA: Diagnosis present

## 2015-08-18 DIAGNOSIS — Z4659 Encounter for fitting and adjustment of other gastrointestinal appliance and device: Secondary | ICD-10-CM

## 2015-08-18 DIAGNOSIS — J9811 Atelectasis: Secondary | ICD-10-CM | POA: Diagnosis present

## 2015-08-18 HISTORY — PX: CRANIOTOMY: SHX93

## 2015-08-18 LAB — URINALYSIS, ROUTINE W REFLEX MICROSCOPIC
Bilirubin Urine: NEGATIVE
GLUCOSE, UA: NEGATIVE mg/dL
KETONES UR: 15 mg/dL — AB
LEUKOCYTES UA: NEGATIVE
NITRITE: NEGATIVE
PROTEIN: NEGATIVE mg/dL
Specific Gravity, Urine: >1.046 — ABNORMAL HIGH (ref 1.005–1.030)
pH: 7.5 (ref 5.0–8.0)

## 2015-08-18 LAB — URINE MICROSCOPIC-ADD ON

## 2015-08-18 LAB — DIFFERENTIAL
Basophils Absolute: 0.1 10*3/uL (ref 0.0–0.1)
Basophils Relative: 1 %
EOS ABS: 0.3 10*3/uL (ref 0.0–0.7)
EOS PCT: 3 %
LYMPHS ABS: 3.3 10*3/uL (ref 0.7–4.0)
Lymphocytes Relative: 39 %
MONO ABS: 0.7 10*3/uL (ref 0.1–1.0)
Monocytes Relative: 9 %
NEUTROS PCT: 48 %
Neutro Abs: 4.1 10*3/uL (ref 1.7–7.7)

## 2015-08-18 LAB — GLUCOSE, CAPILLARY
GLUCOSE-CAPILLARY: 149 mg/dL — AB (ref 65–99)
Glucose-Capillary: 119 mg/dL — ABNORMAL HIGH (ref 65–99)
Glucose-Capillary: 132 mg/dL — ABNORMAL HIGH (ref 65–99)

## 2015-08-18 LAB — POCT I-STAT 7, (LYTES, BLD GAS, ICA,H+H)
ACID-BASE EXCESS: 1 mmol/L (ref 0.0–2.0)
BICARBONATE: 26.3 meq/L — AB (ref 20.0–24.0)
CALCIUM ION: 1.16 mmol/L (ref 1.13–1.30)
HEMATOCRIT: 42 % (ref 36.0–46.0)
Hemoglobin: 14.3 g/dL (ref 12.0–15.0)
O2 Saturation: 100 %
PH ART: 7.427 (ref 7.350–7.450)
POTASSIUM: 4 mmol/L (ref 3.5–5.1)
SODIUM: 138 mmol/L (ref 135–145)
TCO2: 28 mmol/L (ref 0–100)
pCO2 arterial: 39.4 mmHg (ref 35.0–45.0)
pO2, Arterial: 290 mmHg — ABNORMAL HIGH (ref 80.0–100.0)

## 2015-08-18 LAB — I-STAT CHEM 8, ED
BUN: 22 mg/dL — ABNORMAL HIGH (ref 6–20)
Calcium, Ion: 1.19 mmol/L (ref 1.13–1.30)
Chloride: 103 mmol/L (ref 101–111)
Creatinine, Ser: 0.8 mg/dL (ref 0.44–1.00)
Glucose, Bld: 130 mg/dL — ABNORMAL HIGH (ref 65–99)
HEMATOCRIT: 43 % (ref 36.0–46.0)
HEMOGLOBIN: 14.6 g/dL (ref 12.0–15.0)
Potassium: 4 mmol/L (ref 3.5–5.1)
SODIUM: 140 mmol/L (ref 135–145)
TCO2: 25 mmol/L (ref 0–100)

## 2015-08-18 LAB — COMPREHENSIVE METABOLIC PANEL
ALBUMIN: 3.8 g/dL (ref 3.5–5.0)
ALT: 26 U/L (ref 14–54)
ANION GAP: 9 (ref 5–15)
AST: 36 U/L (ref 15–41)
Alkaline Phosphatase: 85 U/L (ref 38–126)
BILIRUBIN TOTAL: 0.7 mg/dL (ref 0.3–1.2)
BUN: 18 mg/dL (ref 6–20)
CALCIUM: 10 mg/dL (ref 8.9–10.3)
CO2: 24 mmol/L (ref 22–32)
Chloride: 104 mmol/L (ref 101–111)
Creatinine, Ser: 0.89 mg/dL (ref 0.44–1.00)
GFR calc non Af Amer: >60 mL/min (ref >60)
GLUCOSE: 132 mg/dL — AB (ref 65–99)
POTASSIUM: 4 mmol/L (ref 3.5–5.1)
SODIUM: 137 mmol/L (ref 135–145)
TOTAL PROTEIN: 8.4 g/dL — AB (ref 6.5–8.1)

## 2015-08-18 LAB — CBC
HCT: 40.3 % (ref 36.0–46.0)
Hemoglobin: 13.5 g/dL (ref 12.0–15.0)
MCH: 33.1 pg (ref 26.0–34.0)
MCHC: 33.5 g/dL (ref 30.0–36.0)
MCV: 98.8 fL (ref 78.0–100.0)
PLATELETS: 388 10*3/uL (ref 150–400)
RBC: 4.08 MIL/uL (ref 3.87–5.11)
RDW: 13 % (ref 11.5–15.5)
WBC: 8.4 10*3/uL (ref 4.0–10.5)

## 2015-08-18 LAB — ETHANOL: Alcohol, Ethyl (B): <5 mg/dL (ref <5)

## 2015-08-18 LAB — I-STAT ARTERIAL BLOOD GAS, ED
Acid-Base Excess: 5 mmol/L — ABNORMAL HIGH (ref 0.0–2.0)
Bicarbonate: 29.7 mEq/L — ABNORMAL HIGH (ref 20.0–24.0)
O2 SAT: 100 %
PCO2 ART: 43 mmHg (ref 35.0–45.0)
PH ART: 7.447 (ref 7.350–7.450)
TCO2: 31 mmol/L (ref 0–100)
pO2, Arterial: 376 mmHg — ABNORMAL HIGH (ref 80.0–100.0)

## 2015-08-18 LAB — PROTIME-INR
INR: 1.12 (ref 0.00–1.49)
PROTHROMBIN TIME: 14.6 s (ref 11.6–15.2)

## 2015-08-18 LAB — TSH: TSH: 0.449 u[IU]/mL (ref 0.350–4.500)

## 2015-08-18 LAB — RAPID URINE DRUG SCREEN, HOSP PERFORMED
Amphetamines: NOT DETECTED
BARBITURATES: NOT DETECTED
Benzodiazepines: NOT DETECTED
Cocaine: NOT DETECTED
Opiates: NOT DETECTED
TETRAHYDROCANNABINOL: NOT DETECTED

## 2015-08-18 LAB — MRSA PCR SCREENING: MRSA by PCR: NEGATIVE

## 2015-08-18 LAB — I-STAT TROPONIN, ED: TROPONIN I, POC: 0 ng/mL (ref 0.00–0.08)

## 2015-08-18 LAB — TRIGLYCERIDES: Triglycerides: 108 mg/dL (ref <150)

## 2015-08-18 LAB — PREPARE RBC (CROSSMATCH)

## 2015-08-18 LAB — APTT: aPTT: 30 seconds (ref 24–37)

## 2015-08-18 SURGERY — CRANIOTOMY TUMOR EXCISION
Anesthesia: General | Laterality: Left

## 2015-08-18 MED ORDER — PROPOFOL 1000 MG/100ML IV EMUL
0.0000 ug/kg/min | INTRAVENOUS | Status: DC
  Administered 2015-08-18 – 2015-08-19 (×7): 40 ug/kg/min via INTRAVENOUS
  Administered 2015-08-19 (×2): 35 ug/kg/min via INTRAVENOUS
  Administered 2015-08-20: 10 ug/kg/min via INTRAVENOUS
  Administered 2015-08-21: 9.996 ug/kg/min via INTRAVENOUS
  Administered 2015-08-22: 10 ug/kg/min via INTRAVENOUS
  Administered 2015-08-23: 5 ug/kg/min via INTRAVENOUS
  Filled 2015-08-18 (×13): qty 100

## 2015-08-18 MED ORDER — CHLORHEXIDINE GLUCONATE 0.12% ORAL RINSE (MEDLINE KIT)
15.0000 mL | Freq: Two times a day (BID) | OROMUCOSAL | Status: DC
  Administered 2015-08-18 – 2015-09-01 (×28): 15 mL via OROMUCOSAL

## 2015-08-18 MED ORDER — FENTANYL CITRATE (PF) 100 MCG/2ML IJ SOLN
INTRAMUSCULAR | Status: AC
  Filled 2015-08-18: qty 2

## 2015-08-18 MED ORDER — FENTANYL CITRATE (PF) 100 MCG/2ML IJ SOLN
INTRAMUSCULAR | Status: DC | PRN
  Administered 2015-08-18 (×4): 50 ug via INTRAVENOUS

## 2015-08-18 MED ORDER — MICROFIBRILLAR COLL HEMOSTAT EX POWD
CUTANEOUS | Status: DC | PRN
  Administered 2015-08-18: 1 g via TOPICAL

## 2015-08-18 MED ORDER — ROCURONIUM BROMIDE 50 MG/5ML IV SOLN
INTRAVENOUS | Status: AC
  Filled 2015-08-18: qty 1

## 2015-08-18 MED ORDER — PHENYLEPHRINE HCL 10 MG/ML IJ SOLN
10.0000 mg | INTRAMUSCULAR | Status: DC | PRN
  Administered 2015-08-18: 20 ug/min via INTRAVENOUS

## 2015-08-18 MED ORDER — ACETAMINOPHEN 325 MG PO TABS
650.0000 mg | ORAL_TABLET | ORAL | Status: DC | PRN
  Administered 2015-08-19 – 2015-08-24 (×9): 650 mg via ORAL
  Filled 2015-08-18 (×11): qty 2

## 2015-08-18 MED ORDER — PROPOFOL 1000 MG/100ML IV EMUL
INTRAVENOUS | Status: AC
  Administered 2015-08-18: 5 ug/kg/min via INTRAVENOUS
  Filled 2015-08-18: qty 100

## 2015-08-18 MED ORDER — IOPAMIDOL (ISOVUE-300) INJECTION 61%
INTRAVENOUS | Status: AC
  Administered 2015-08-18: 100 mL
  Filled 2015-08-18: qty 100

## 2015-08-18 MED ORDER — CEFAZOLIN SODIUM-DEXTROSE 2-3 GM-% IV SOLR
INTRAVENOUS | Status: DC | PRN
  Administered 2015-08-18: 2 g via INTRAVENOUS

## 2015-08-18 MED ORDER — PROPOFOL 10 MG/ML IV BOLUS
INTRAVENOUS | Status: AC
  Filled 2015-08-18: qty 20

## 2015-08-18 MED ORDER — BACITRACIN ZINC 500 UNIT/GM EX OINT
TOPICAL_OINTMENT | CUTANEOUS | Status: DC | PRN
  Administered 2015-08-18: 1 via TOPICAL

## 2015-08-18 MED ORDER — SODIUM CHLORIDE 0.9 % IV SOLN
INTRAVENOUS | Status: DC | PRN
  Administered 2015-08-18: 12:00:00 via INTRAVENOUS

## 2015-08-18 MED ORDER — ACETAMINOPHEN 650 MG RE SUPP
650.0000 mg | RECTAL | Status: DC | PRN
  Administered 2015-08-20 – 2015-08-23 (×4): 650 mg via RECTAL
  Filled 2015-08-18 (×4): qty 1

## 2015-08-18 MED ORDER — LORAZEPAM 2 MG/ML IJ SOLN
INTRAMUSCULAR | Status: AC
  Filled 2015-08-18: qty 1

## 2015-08-18 MED ORDER — ETOMIDATE 2 MG/ML IV SOLN
INTRAVENOUS | Status: AC | PRN
  Administered 2015-08-18: 20 mg via INTRAVENOUS

## 2015-08-18 MED ORDER — LEVETIRACETAM 500 MG/5ML IV SOLN: 1000.0000 mg | Freq: Two times a day (BID) | INTRAVENOUS | Status: DC

## 2015-08-18 MED ORDER — FENTANYL CITRATE (PF) 250 MCG/5ML IJ SOLN
INTRAMUSCULAR | Status: AC
  Filled 2015-08-18: qty 5

## 2015-08-18 MED ORDER — STROKE: EARLY STAGES OF RECOVERY BOOK
Freq: Once | Status: DC
  Filled 2015-08-18: qty 1

## 2015-08-18 MED ORDER — DEXAMETHASONE SODIUM PHOSPHATE 10 MG/ML IJ SOLN
10.0000 mg | Freq: Once | INTRAMUSCULAR | Status: AC
Start: 2015-08-18 — End: 2015-08-18
  Administered 2015-08-18: 10 mg via INTRAVENOUS
  Filled 2015-08-18: qty 1

## 2015-08-18 MED ORDER — SODIUM CHLORIDE 0.9 % IV SOLN: Freq: Once | INTRAVENOUS | Status: DC

## 2015-08-18 MED ORDER — FENTANYL CITRATE (PF) 100 MCG/2ML IJ SOLN
100.0000 ug | INTRAMUSCULAR | Status: AC | PRN
  Administered 2015-08-18 – 2015-08-20 (×3): 100 ug via INTRAVENOUS
  Filled 2015-08-18 (×4): qty 2

## 2015-08-18 MED ORDER — ONDANSETRON HCL 4 MG/2ML IJ SOLN
INTRAMUSCULAR | Status: AC
  Filled 2015-08-18: qty 2

## 2015-08-18 MED ORDER — LEVOTHYROXINE SODIUM 100 MCG IV SOLR
62.5000 ug | Freq: Every day | INTRAVENOUS | Status: DC
  Administered 2015-08-19 – 2015-08-24 (×6): 62.5 ug via INTRAVENOUS
  Filled 2015-08-18 (×6): qty 5

## 2015-08-18 MED ORDER — FENTANYL CITRATE (PF) 100 MCG/2ML IJ SOLN
100.0000 ug | INTRAMUSCULAR | Status: DC | PRN
  Administered 2015-08-18 – 2015-08-21 (×7): 100 ug via INTRAVENOUS
  Filled 2015-08-18 (×5): qty 2

## 2015-08-18 MED ORDER — SODIUM CHLORIDE 0.9 % IV SOLN
1500.0000 mg | INTRAVENOUS | Status: AC
  Administered 2015-08-18: 1500 mg via INTRAVENOUS
  Filled 2015-08-18: qty 30

## 2015-08-18 MED ORDER — LABETALOL HCL 5 MG/ML IV SOLN: 10.0000 mg | INTRAVENOUS | Status: DC | PRN

## 2015-08-18 MED ORDER — NICARDIPINE HCL IN NACL 20-0.86 MG/200ML-% IV SOLN
3.0000 mg/h | INTRAVENOUS | Status: DC
  Filled 2015-08-18: qty 200

## 2015-08-18 MED ORDER — MICROFIBRILLAR COLL HEMOSTAT EX PADS
MEDICATED_PAD | CUTANEOUS | Status: DC | PRN
Start: 2015-08-18 — End: 2015-08-18
  Administered 2015-08-18: 1 via TOPICAL

## 2015-08-18 MED ORDER — PANTOPRAZOLE SODIUM 40 MG IV SOLR
40.0000 mg | Freq: Every day | INTRAVENOUS | Status: DC
  Administered 2015-08-18 – 2015-08-23 (×6): 40 mg via INTRAVENOUS
  Filled 2015-08-18 (×6): qty 40

## 2015-08-18 MED ORDER — PROPOFOL 1000 MG/100ML IV EMUL: 0.0000 ug/kg/min | INTRAVENOUS | Status: DC

## 2015-08-18 MED ORDER — HYDROMORPHONE HCL 1 MG/ML IJ SOLN: 0.2500 mg | INTRAMUSCULAR | Status: DC | PRN

## 2015-08-18 MED ORDER — THROMBIN 20000 UNITS EX SOLR
CUTANEOUS | Status: DC | PRN
  Administered 2015-08-18: 12:00:00 via TOPICAL

## 2015-08-18 MED ORDER — ROCURONIUM BROMIDE 50 MG/5ML IV SOLN
INTRAVENOUS | Status: AC | PRN
  Administered 2015-08-18: 100 mg via INTRAVENOUS

## 2015-08-18 MED ORDER — ANTISEPTIC ORAL RINSE SOLUTION (CORINZ)
7.0000 mL | Freq: Four times a day (QID) | OROMUCOSAL | Status: DC
  Administered 2015-08-18 – 2015-08-20 (×9): 7 mL via OROMUCOSAL

## 2015-08-18 MED ORDER — SODIUM CHLORIDE 0.9 % IV SOLN
INTRAVENOUS | Status: DC | PRN
  Administered 2015-08-18: 13:00:00 via INTRAVENOUS

## 2015-08-18 MED ORDER — SODIUM CHLORIDE 0.9 % IV SOLN
INTRAVENOUS | Status: DC
  Administered 2015-08-18 – 2015-09-01 (×7): via INTRAVENOUS

## 2015-08-18 MED ORDER — CEFAZOLIN SODIUM 1 G IJ SOLR
INTRAMUSCULAR | Status: AC
  Filled 2015-08-18: qty 20

## 2015-08-18 MED ORDER — PROPOFOL 500 MG/50ML IV EMUL
INTRAVENOUS | Status: DC | PRN
  Administered 2015-08-18: 50 ug/kg/min via INTRAVENOUS

## 2015-08-18 MED ORDER — BUPIVACAINE-EPINEPHRINE 0.5% -1:200000 IJ SOLN
INTRAMUSCULAR | Status: DC | PRN
  Administered 2015-08-18: 20 mL

## 2015-08-18 MED ORDER — THROMBIN 5000 UNITS EX SOLR
OROMUCOSAL | Status: DC | PRN
  Administered 2015-08-18: 14:00:00 via TOPICAL

## 2015-08-18 MED ORDER — IOPAMIDOL (ISOVUE-370) INJECTION 76%
INTRAVENOUS | Status: AC
  Administered 2015-08-18: 50 mL
  Filled 2015-08-18: qty 50

## 2015-08-18 MED ORDER — ONDANSETRON HCL 4 MG/2ML IJ SOLN: 4.0000 mg | Freq: Once | INTRAMUSCULAR | Status: DC | PRN

## 2015-08-18 MED ORDER — PROPOFOL 1000 MG/100ML IV EMUL
5.0000 ug/kg/min | Freq: Once | INTRAVENOUS | Status: DC
  Administered 2015-08-18: 5 ug/kg/min via INTRAVENOUS

## 2015-08-18 MED ORDER — ROCURONIUM BROMIDE 100 MG/10ML IV SOLN
INTRAVENOUS | Status: DC | PRN
  Administered 2015-08-18 (×2): 50 mg via INTRAVENOUS

## 2015-08-18 MED ORDER — SENNOSIDES-DOCUSATE SODIUM 8.6-50 MG PO TABS
1.0000 | ORAL_TABLET | Freq: Two times a day (BID) | ORAL | Status: DC
  Administered 2015-08-18 – 2015-08-30 (×10): 1
  Filled 2015-08-18 (×10): qty 1

## 2015-08-18 MED ORDER — SODIUM CHLORIDE 0.9 % IV SOLN
1500.0000 mg | Freq: Two times a day (BID) | INTRAVENOUS | Status: DC
  Administered 2015-08-18 – 2015-09-01 (×28): 1500 mg via INTRAVENOUS
  Filled 2015-08-18 (×32): qty 15

## 2015-08-18 MED ORDER — 0.9 % SODIUM CHLORIDE (POUR BTL) OPTIME
TOPICAL | Status: DC | PRN
  Administered 2015-08-18 (×3): 1000 mL

## 2015-08-18 SURGICAL SUPPLY — 90 items
APL SKNCLS STERI-STRIP NONHPOA (GAUZE/BANDAGES/DRESSINGS) ×1
BENZOIN TINCTURE PRP APPL 2/3 (GAUZE/BANDAGES/DRESSINGS) ×3 IMPLANT
BIT DRILL WIRE PASS 1.3MM (BIT) IMPLANT
BLADE ULTRA TIP 2M (BLADE) ×3 IMPLANT
BNDG GAUZE ELAST 4 BULKY (GAUZE/BANDAGES/DRESSINGS) IMPLANT
BRUSH SCRUB EZ 1% IODOPHOR (MISCELLANEOUS) ×3 IMPLANT
BUR ACORN 6.0 PRECISION (BURR) ×2 IMPLANT
BUR ACORN 6.0MM PRECISION (BURR) ×1
BUR ADDG 1.1 (BURR) ×2 IMPLANT
BUR ADDG 1.1MM (BURR) ×1
BUR MATCHSTICK NEURO 3.0 LAGG (BURR) ×3 IMPLANT
BUR SPIRAL ROUTER 2.3 (BUR) IMPLANT
BUR SPIRAL ROUTER 2.3MM (BUR)
CANISTER SUCT 3000ML PPV (MISCELLANEOUS) ×3 IMPLANT
CATH VENTRIC 35X38 W/TROCAR LG (CATHETERS) ×3 IMPLANT
CLIP TI MEDIUM 6 (CLIP) ×3 IMPLANT
CONT SPEC 4OZ CLIKSEAL STRL BL (MISCELLANEOUS) ×3 IMPLANT
COVER BACK TABLE 60X90IN (DRAPES) ×3 IMPLANT
DECANTER SPIKE VIAL GLASS SM (MISCELLANEOUS) ×3 IMPLANT
DRAIN SNY WOU 7FLT (WOUND CARE) IMPLANT
DRAIN SUBARACHNOID (WOUND CARE) IMPLANT
DRAPE CAMERA VIDEO/LASER (DRAPES) IMPLANT
DRAPE LONG LASER MIC (DRAPES) IMPLANT
DRAPE MICROSCOPE LEICA (MISCELLANEOUS) IMPLANT
DRAPE NEUROLOGICAL W/INCISE (DRAPES) ×3 IMPLANT
DRAPE SURG IRRIG POUCH 19X23 (DRAPES) IMPLANT
DRAPE WARM FLUID 44X44 (DRAPE) ×3 IMPLANT
DRILL WIRE PASS 1.3MM (BIT)
DRSG OPSITE POSTOP 4X6 (GAUZE/BANDAGES/DRESSINGS) ×3 IMPLANT
DURAMATRIX ONLAY 3X3 (Plate) ×3 IMPLANT
DURAPREP 6ML APPLICATOR 50/CS (WOUND CARE) IMPLANT
ELECT CAUTERY BLADE 6.4 (BLADE) ×3 IMPLANT
ELECT REM PT RETURN 9FT ADLT (ELECTROSURGICAL) ×3
ELECTRODE REM PT RTRN 9FT ADLT (ELECTROSURGICAL) ×1 IMPLANT
EVACUATOR 1/8 PVC DRAIN (DRAIN) IMPLANT
EVACUATOR SILICONE 100CC (DRAIN) IMPLANT
FORCEPS BIPOLAR SPETZLER 8 1.0 (NEUROSURGERY SUPPLIES) ×3 IMPLANT
GAUZE SPONGE 4X4 12PLY STRL (GAUZE/BANDAGES/DRESSINGS) ×3 IMPLANT
GAUZE SPONGE 4X4 16PLY XRAY LF (GAUZE/BANDAGES/DRESSINGS) IMPLANT
GLOVE BIO SURGEON STRL SZ7 (GLOVE) ×3 IMPLANT
GLOVE BIOGEL M 8.0 STRL (GLOVE) ×3 IMPLANT
GLOVE ECLIPSE 7.0 STRL STRAW (GLOVE) ×3 IMPLANT
GLOVE EXAM NITRILE LRG STRL (GLOVE) IMPLANT
GLOVE EXAM NITRILE MD LF STRL (GLOVE) IMPLANT
GLOVE EXAM NITRILE XL STR (GLOVE) IMPLANT
GLOVE EXAM NITRILE XS STR PU (GLOVE) IMPLANT
GLOVE INDICATOR 6.5 STRL GRN (GLOVE) ×3 IMPLANT
GLOVE INDICATOR 7.5 STRL GRN (GLOVE) ×3 IMPLANT
GLOVE SURG SS PI 6.5 STRL IVOR (GLOVE) ×6 IMPLANT
GOWN STRL REUS W/ TWL LRG LVL3 (GOWN DISPOSABLE) ×3 IMPLANT
GOWN STRL REUS W/ TWL XL LVL3 (GOWN DISPOSABLE) ×1 IMPLANT
GOWN STRL REUS W/TWL 2XL LVL3 (GOWN DISPOSABLE) IMPLANT
GOWN STRL REUS W/TWL LRG LVL3 (GOWN DISPOSABLE) ×9
GOWN STRL REUS W/TWL XL LVL3 (GOWN DISPOSABLE) ×3
HEMOSTAT POWDER SURGIFOAM 1G (HEMOSTASIS) ×3 IMPLANT
HEMOSTAT SURGICEL 2X14 (HEMOSTASIS) ×3 IMPLANT
KIT BASIN OR (CUSTOM PROCEDURE TRAY) ×3 IMPLANT
KIT DRAIN CSF ACCUDRAIN (MISCELLANEOUS) ×3 IMPLANT
KIT ROOM TURNOVER OR (KITS) ×3 IMPLANT
NEEDLE HYPO 25X1 1.5 SAFETY (NEEDLE) ×3 IMPLANT
NS IRRIG 1000ML POUR BTL (IV SOLUTION) ×3 IMPLANT
PACK CRANIOTOMY (CUSTOM PROCEDURE TRAY) ×3 IMPLANT
PAD EYE OVAL STERILE LF (GAUZE/BANDAGES/DRESSINGS) IMPLANT
PATTIES SURGICAL .25X.25 (GAUZE/BANDAGES/DRESSINGS) IMPLANT
PATTIES SURGICAL .5 X.5 (GAUZE/BANDAGES/DRESSINGS) IMPLANT
PATTIES SURGICAL .5 X3 (DISPOSABLE) IMPLANT
PATTIES SURGICAL 1/4 X 3 (GAUZE/BANDAGES/DRESSINGS) IMPLANT
PATTIES SURGICAL 1X1 (DISPOSABLE) ×3 IMPLANT
PENCIL BUTTON HOLSTER BLD 10FT (ELECTRODE) ×3 IMPLANT
PIN MAYFIELD SKULL DISP (PIN) ×3 IMPLANT
PLATE 1.5  2HOLE LNG NEURO (Plate) ×2 IMPLANT
PLATE 1.5 2HOLE LNG NEURO (Plate) ×1 IMPLANT
PLATE 1.5/0.5 13MM BURR HOLE (Plate) ×6 IMPLANT
RUBBERBAND STERILE (MISCELLANEOUS) ×6 IMPLANT
SCREW SELF DRILL HT 1.5/4MM (Screw) ×24 IMPLANT
SPONGE NEURO XRAY DETECT 1X3 (DISPOSABLE) IMPLANT
SPONGE SURGIFOAM ABS GEL 100 (HEMOSTASIS) IMPLANT
STAPLER SKIN PROX WIDE 3.9 (STAPLE) ×3 IMPLANT
STAPLER VISISTAT 35W (STAPLE) ×3 IMPLANT
SUT NURALON 4 0 TR CR/8 (SUTURE) ×9 IMPLANT
SUT SILK 0 TIES 10X30 (SUTURE) IMPLANT
SUT VIC AB 2-0 CP2 18 (SUTURE) ×6 IMPLANT
TIP SONASTAR STD MISONIX 1.9 (TRAY / TRAY PROCEDURE) IMPLANT
TOWEL OR 17X24 6PK STRL BLUE (TOWEL DISPOSABLE) ×3 IMPLANT
TOWEL OR 17X26 10 PK STRL BLUE (TOWEL DISPOSABLE) ×3 IMPLANT
TRAY FOLEY W/METER SILVER 14FR (SET/KITS/TRAYS/PACK) ×3 IMPLANT
TUBE CONNECTING 12'X1/4 (SUCTIONS) ×2
TUBE CONNECTING 12X1/4 (SUCTIONS) ×4 IMPLANT
UNDERPAD 30X30 INCONTINENT (UNDERPADS AND DIAPERS) ×3 IMPLANT
WATER STERILE IRR 1000ML POUR (IV SOLUTION) ×3 IMPLANT

## 2015-08-18 NOTE — Anesthesia Postprocedure Evaluation (Deleted)
Anesthesia Post Note  Patient: Sylvia Moore  Procedure(s) Performed: Procedure(s) (LRB): CRANIOTOMY TUMOR EXCISION. Hemorrhage (Left)  Patient location during evaluation: PACU Anesthesia Type: General Level of consciousness: awake, awake and alert and oriented Pain management: pain level controlled Respiratory status: spontaneous breathing, nonlabored ventilation and respiratory function stable Cardiovascular status: blood pressure returned to baseline Anesthetic complications: no    Last Vitals:  Filed Vitals:   08/18/15 1800 08/18/15 1900  BP: 112/58 121/64  Pulse: 81 84  Temp:  36.8 C  Resp: 18 18    Last Pain: There were no vitals filed for this visit.               Tahsin Benyo COKER

## 2015-08-18 NOTE — Anesthesia Preprocedure Evaluation (Addendum)
Anesthesia Evaluation  Patient identified by MRN, date of birth, ID band Patient unresponsive    Reviewed: Unable to perform ROS - Chart review onlyPreop documentation limited or incomplete due to emergent nature of procedure.  Airway Mallampati: Intubated       Dental   Pulmonary former smoker,    + rhonchi        Cardiovascular hypertension, +CHF   Rhythm:Regular Rate:Normal     Neuro/Psych PSYCHIATRIC DISORDERS Depression    GI/Hepatic GERD  ,  Endo/Other  Hypothyroidism   Renal/GU      Musculoskeletal   Abdominal   Peds  Hematology   Anesthesia Other Findings   Reproductive/Obstetrics                            Anesthesia Physical Anesthesia Plan  ASA: IV and emergent  Anesthesia Plan: General   Post-op Pain Management:    Induction: Inhalational  Airway Management Planned: Oral ETT  Additional Equipment: Arterial line, CVP and Ultrasound Guidance Line Placement  Intra-op Plan:   Post-operative Plan: Post-operative intubation/ventilation  Informed Consent: I have reviewed the patients History and Physical, chart, labs and discussed the procedure including the risks, benefits and alternatives for the proposed anesthesia with the patient or authorized representative who has indicated his/her understanding and acceptance.     Plan Discussed with: CRNA, Anesthesiologist and Surgeon  Anesthesia Plan Comments: (65 year old female intubated in ER  1. Acute L Fronto- temporal hemorrhage 2. Non-ischemic cardiomyopathy  3. St. Jude Defibrillator/Bi-vent pacer 4. H/O hodgkins lymphoma S/P Chemo Rx and Radiation 1977  Plan GA with art line and central line)       Anesthesia Quick Evaluation

## 2015-08-18 NOTE — Code Documentation (Signed)
65yo female arriving to [facility] via [place] at [phone].  EMS reports that patient was at home and woke up at her baseline at 0700.  Patient had sudden onset disorientation and headache per family.  EMS reports patient with speech difficulty and activated a code stroke.  EMS reports patient clammy, diaphoretic and vomited x 1.  Stroke team at the bedside on patient arrival.  Labs drawn and patient cleared for CT by Dr. Regenia Skeeter.  Patient is hard of hearing and not wearing her hearing aids, however, speaking on arrival to the ED.  Patient to CT with team.  Dr. Silverio Decamp to the bedside.  While in CT but prior to exam patient became nauseous and vomited x 1.  Then patient witnessed to have seizure-like activity.  PIV placed x 2.  Ativan and Zofran IVP given per MD order.  CT completed showing ~ 3cm left frontal parenchymal hematoma with subarachnoid and subdural extension with 41mm midline shift.  Patient transferred to Trauma C.  NIHSS 22, see documentation for details and code stroke times.  Patient markedly more drowsy and not following commands on exam.  Patient intubated by EDP.  NS consulted and patient to CT for additional imaging.  Patient to be admitted to ICU.  Bedside handoff with ED RN Lexine Baton.

## 2015-08-18 NOTE — ED Notes (Signed)
Pt. LKN at 0730 with witnessed sudden onset disorientation by wife. C/o HA and family member reports aphasia. EMS arrived and noted garbled speech, but besides that no neuro def. Pt. Ambulatory with normal gate, no gaze, no drift, and no facial assymmetry.

## 2015-08-18 NOTE — ED Notes (Signed)
Pt noted to move left arm and leg. Pt also withdraws rt arm and leg from pain.

## 2015-08-18 NOTE — Consult Note (Signed)
  Patient seen  Spoke wuth family  To go to nicu and to surgery once she is clinically rweady   Seen by CCM

## 2015-08-18 NOTE — Anesthesia Postprocedure Evaluation (Signed)
Anesthesia Post Note  Patient: Sylvia Moore  Procedure(s) Performed: Procedure(s) (LRB): CRANIOTOMY TUMOR EXCISION. Hemorrhage (Left)  Patient location during evaluation: NICU Anesthesia Type: General Level of consciousness: patient remains intubated per anesthesia plan Vital Signs Assessment: post-procedure vital signs reviewed and stable Respiratory status: patient on ventilator - see flowsheet for VS and patient remains intubated per anesthesia plan Cardiovascular status: blood pressure returned to baseline Anesthetic complications: no    Last Vitals:  Filed Vitals:   08/18/15 1800 08/18/15 1900  BP: 112/58 121/64  Pulse: 81 84  Temp:  36.8 C  Resp: 18 18    Last Pain: There were no vitals filed for this visit.               Penni Penado COKER

## 2015-08-18 NOTE — ED Notes (Signed)
Neurosurgery MD at bedside.

## 2015-08-18 NOTE — H&P (Signed)
PULMONARY / CRITICAL CARE MEDICINE   Name: Sylvia Moore MRN: [id number] DOB: 08/22/1950    ADMISSION DATE:  08/18/2015 CONSULTATION DATE:  4/18  REFERRING MD:  Regenia Skeeter  CHIEF COMPLAINT:  ICH  HISTORY OF PRESENT ILLNESS:   13 Moore c/o headache the am of 4/18-->followed by aphasia and then somnolence. Had witnessed seizure activity by staff while in CT scan. CT showed large left ICH w/ midline shift. Admitted to PCCM service, w/ plan for probable left Crani pending results of CT angio  PAST MEDICAL HISTORY :  Sylvia Moore  has a past medical history of Chronic systolic heart failure ([place]); Depression; Gout; HOH (hard of hearing); Schwannoma of spinal cord ([place]); Orthostatic hypotension; NICM (nonischemic cardiomyopathy) ([facility]); GERD (gastroesophageal reflux disease); LBBB (left bundle branch block); Pneumonia (1960's X 1; 2014); Hypothyroidism; History of blood transfusion (~ 1976); Hepatitis (~ 1978); and Hodgkin's lymphoma ([place]).  PAST SURGICAL HISTORY: Sylvia Moore  has past surgical history that includes Hysteroscopy w/D&C; Colonoscopy w/ polypectomy; Lumbar fusion (07/2011); Dilation and curettage of uterus; Abdominal exploration surgery (1975); Knee arthroscopy (Left); Colposcopy (N/A, 08/14/2012); Lesion removal (N/A, 08/14/2012); Rectal biopsy (N/A, 08/14/2012); Esophagogastroduodenoscopy (egd) with propofol (N/A, 01/20/2014); Colonoscopy with propofol (N/A, 01/20/2014); Knee arthroscopy (Right, 2015); left heart catheterization with coronary angiogram (N/A, 07/01/2014); Back surgery; Laparoscopic cholecystectomy (~ 2005); Neck lesion biopsy (Right, 1975); Splenectomy (1970's); and bi-ventricular implantable cardioverter defibrillator (N/A, 07/08/2014).  Allergies  Allergen Reactions  . Codeine Nausea And Vomiting    No current facility-administered medications on file prior to encounter.   Current Outpatient Prescriptions on File Prior to Encounter  Medication Sig  . allopurinol (ZYLOPRIM) 100 MG tablet  Take 100 mg by mouth 2 (two) times daily.  . carvedilol (COREG) 12.5 MG tablet Take 0.5 tablets (6.25 mg total) by mouth 2 (two) times daily with a meal.  . CORLANOR 5 MG TABS tablet TAKE 1 & 1/2 TABLETS TWICE DAILY WITH MEALS.  . furosemide (LASIX) 20 MG tablet TAKE 1 TABLET ONCE DAILY.  [name] levothyroxine (SYNTHROID, LEVOTHROID) 125 MCG tablet Take 125 mcg by mouth daily. Reported on 05/22/2015  . Multiple Vitamins-Minerals (MULTIVITAMIN PO) Take 1 tablet by mouth daily.  [name] omeprazole (PRILOSEC OTC) 20 MG tablet Take 20 mg by mouth at bedtime.   . valsartan (DIOVAN) 40 MG tablet TAKE 1 TABLET ONCE DAILY.  [name] venlafaxine XR (EFFEXOR-XR) 37.5 MG 24 hr capsule Take 37.5 mg by mouth daily with breakfast.   . nitroGLYCERIN (NITROSTAT) 0.4 MG SL tablet Place 1 tablet (0.4 mg total) under the tongue every 5 (five) minutes as needed for chest pain.    FAMILY HISTORY:  Her indicated that her mother is deceased. Sylvia Moore indicated that her father is deceased. Sylvia Moore indicated that her sister is alive. Sylvia Moore indicated that her brother is alive.   SOCIAL HISTORY: Sylvia Moore  reports that Sylvia Moore quit smoking about 47 years ago. Her smoking use included Cigarettes. Sylvia Moore has a .25 pack-year smoking history. Sylvia Moore has never used smokeless tobacco. Sylvia Moore reports that Sylvia Moore drinks about 1.8 oz of alcohol per week. Sylvia Moore reports that Sylvia Moore uses illicit drugs (Marijuana).  REVIEW OF SYSTEMS:   unable  SUBJECTIVE:  Unresponsive on vent   VITAL SIGNS: BP 153/95 mmHg  Pulse 88  Resp 18  SpO2 100%  HEMODYNAMICS:    VENTILATOR SETTINGS:    INTAKE / OUTPUT:    PHYSICAL EXAMINATION: General:  Sedated 65 year old white Moore, currently on full vent support Neuro:  Sedated, no motor def.  HEENT:  PRRL, orally intubated. No JVD Cardiovascular:  rrr w/out MRG Lungs:  Exp wheeze, no accessory use.  Abdomen:  Soft, + bowel sounds No OM Musculoskeletal:  Equal st and bulk Skin:  Warm and dry   LABS:  BMET  Recent Labs Lab  08/18/15 0832 08/18/15 0840  NA 137 140  K 4.0 4.0  CL 104 103  CO2 24  --   BUN 18 22*  CREATININE 0.89 0.80  GLUCOSE 132* 130*    Electrolytes  Recent Labs Lab 08/18/15 0832  CALCIUM 10.0    CBC  Recent Labs Lab 08/18/15 0832 08/18/15 0840  WBC 8.4  --   HGB 13.5 14.6  HCT 40.3 43.0  PLT 388  --     Coag's  Recent Labs Lab 08/18/15 0832  APTT 30  INR 1.12    Sepsis Markers No results for input(s): LATICACIDVEN, PROCALCITON, O2SATVEN in the last 168 hours.  ABG No results for input(s): PHART, PCO2ART, PO2ART in the last 168 hours.  Liver Enzymes  Recent Labs Lab 08/18/15 0832  AST 36  ALT 26  ALKPHOS 85  BILITOT 0.7  ALBUMIN 3.8    Cardiac Enzymes No results for input(s): TROPONINI, PROBNP in the last 168 hours.  Glucose No results for input(s): GLUCAP in the last 168 hours.  Imaging Ct Head Wo Contrast  08/18/2015  CLINICAL DATA:  Code stroke for disorientation. EXAM: CT HEAD WITHOUT CONTRAST TECHNIQUE: Contiguous axial images were obtained from the base of the skull through the vertex without intravenous contrast. COMPARISON:  None. FINDINGS: Skull and Sinuses:No posttraumatic or aggressive finding. Clear visualized paranasal sinuses and mastoids. Visualized orbits: Negative. Brain: Heterogeneous hemorrhage in the posterior left frontal lobe with a prominent rim of vasogenic type edema. Given the patchy hemorrhage, volume estimation is confounded. Maximal axial dimension is 31 x 26 mm. There is local subarachnoid and subdural extension. The subdural hemorrhage measures up to 6 mm in thickness. Inferiorly, there is a high-density 27 mm area which is homogeneous and less dense than the patchy hematoma, possible background mass. Midline shift at 6 mm. No ventricular entrapment. No evidence of acute infarct. No other focal finding. Patient has biventricular pacer. Head CT with and without contrast is recommended. Critical Value/emergent results were  called by telephone at the time of interpretation on 08/18/2015 at 9:03 am to Dr. Silverio Decamp , who was already aware of these findings. IMPRESSION: ~ 3cm left frontal parenchymal hematoma with subarachnoid and subdural extension. There may be an underlying mass and CT with and without contrast is recommended. 6 mm midline shift. Electronically Signed   By: Monte Fantasia M.D.   On: 08/18/2015 09:09   Dg Chest Portable 1 View  08/18/2015  CLINICAL DATA:  Endotracheal and nasogastric tube placements, intubation, chronic systolic heart failure, non ischemic cardiomyopathy, former smoker, personal history non-Hodgkin's lymphoma EXAM: PORTABLE CHEST 1 VIEW COMPARISON:  Portable exam 0921 hours compared to 05/22/2015 FINDINGS: Tip of endotracheal tube projects 3.4 cm above carina. Nasogastric tube extends into stomach. LEFT subclavian pacemaker/AICD leads project over RIGHT atrium, RIGHT ventricle and coronary sinus. Normal heart size, mediastinal contours and pulmonary vascularity. Atherosclerotic calcification aorta. Bibasilar atelectasis. No definite infiltrate, pleural effusion or pneumothorax. IMPRESSION: Bibasilar atelectasis. Tube positions as above. Electronically Signed   By: Lavonia Dana M.D.   On: 08/18/2015 09:51     STUDIES:  4/18:~ 3cm left frontal parenchymal hematoma with subarachnoid and subdural extension. 4/18 CT chest/abd/pelvis: 1. No evidence of primary malignancy or metastatic  disease in the chest or abdomen. 2. 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 3. Low volume chest with bilateral atelectasis. 4/18 Repeat CT head: Large area of hemorrhage in the left hemisphere has progressed since 1 hour ago. There is parenchymal hemorrhage with active extravasation in the left frontal operculum. There is also increase in left hemispheric subdural hemorrhage. There is a small amount of subarachnoid hemorrhage in the left sylvian fissure. Increase in  low-density edema on the left frontal lobe. Increase in midline shift which is now 11 mm to the right 4/18 CT angio of brain >> EEG 4/18>>>  CULTURES:   ANTIBIOTICS:   SIGNIFICANT EVENTS:   LINES/TUBES: oett 4/18>>>  DISCUSSION: Sylvia Moore admitted to PCCM service in setting of sudden metal status changed and witnessed seizure d/t large left ICH. Neuro and neuro-surgery following. The plan will be for neuro-surgical intervention following the results of CT scan of head. Sylvia Moore is medically ready for surgery w/ the plan as described below.   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  Acute encephalopathy  H/o spinal cord  Schwannoma   P:   Imaging per neuro/neuro-surg RASS goal: -2 anticonvulsants Serial neuro-checks  PULMONARY A: Ventilator dependence s/p ICH and seizure  P:   Full vent support  Aim for normal Pco2 F/u abg PAD protocol  CARDIOVASCULAR A:  NICM w/ EF 35-40% H/o LBBB P:  Cont tele BP goal <160 Hold home antihypertensives Will use PRN labetolol as needed  RENAL A:   No acute  P:   Avoid hypotension Renal dose meds Keep even vol status   GASTROINTESTINAL A:   No acute  P:   NPO PPI  HEMATOLOGIC A:   No acute (see neuro re: ICH) P:  Hold all blood thinning agents PAS to LEs Trend CBC coags acceptable and no contraindication to surgery   INFECTIOUS A:   NO acute  P:   Trend CBC and fever curve  ENDOCRINE A:   Hypothyroidism  Mild hyperglycemia   P:   Ck TSH Cont synthroid  ssi      FAMILY  - Updates: Sylvia Moore updated at bedside   - Inter-disciplinary family meet or Palliative Care meeting due by:  4/25   Erick Colace ACNP-BC [place] Pager # [phone] OR # [phone] if no answer   08/18/2015, 10:10 AM

## 2015-08-18 NOTE — Anesthesia Procedure Notes (Signed)
Date/Time: 08/18/2015 12:28 PM Performed by: Garrison Columbus T Pre-anesthesia Checklist: Patient identified, Emergency Drugs available, Suction available and Patient being monitored Patient Re-evaluated:Patient Re-evaluated prior to inductionOxygen Delivery Method: Circle system utilized Preoxygenation: Pre-oxygenation with 100% oxygen Intubation Type: Inhalational induction with existing ETT Placement Confirmation: positive ETCO2 and breath sounds checked- equal and bilateral Tube secured with: Tape Dental Injury: Teeth and Oropharynx as per pre-operative assessment

## 2015-08-18 NOTE — Op Note (Signed)
NAME:  Sylvia Moore, Sylvia Moore NO.:  [id number]  MEDICAL RECORD NO.:  [id number]  LOCATION:  3M10C                        FACILITY:  [facility]  PHYSICIAN:  Leeroy Cha, M.D.   DATE OF BIRTH:  1951/02/08  DATE OF PROCEDURE:  08/18/2015 DATE OF DISCHARGE:                              OPERATIVE REPORT   PREOPERATIVE DIAGNOSES:  Left frontotemporal intracerebral hematoma, rule out tumor, rule out aneurysm, subdural hematoma.  POSTOPERATIVE DIAGNOSES:  Left frontotemporal intracerebral hematoma, rule out tumor, rule out aneurysm, subdural hematoma.  PROCEDURES:  Left frontotemporal craniotomy, evacuation of the subdural hematoma, evacuation of intercerebral hematoma plus resection of abnormal tissue most likely tumor. Insertion of a intraventricular catheter right frontal horn  SURGEON:  Leeroy Cha, M.D.  ASSISTANT:  Dr. Kathyrn Sheriff.  CLINICAL HISTORY:  The patient was admitted this morning because of sudden onset of headache associated with nausea and seizures soon after. The patient was intubated.  She has three CT scans.  The first one showed tumor with some shift and edema, the second showed extravasation of the intravenous dye with more shift and the third was CT angiogram, which was negative for any evidence of AVM or aneurysm.  I spoke with the family, I mentioned the situation, they saw the CT scan and the only way to find out how we can help and wants to do surgery.  They fully aware that although we did not find any abnormality in the CT angio, we were concerned about the possibility of this being a bloody brain tumor or aneurysm or AVM.  The patient knew the risk of the surgery including the possibility of difficulty with speech because the area of location, paralysis, bleeding during surgery.  DESCRIPTION OF PROCEDURE:  The patient was taken to the OR, and after intubation, three pins were applied to the head.  The head was tilted to the right side and the  left side was shaved and cleaned with DuraPrep. Drapes were applied.  A question mark type of incision from the left preauricular area down posterior into the parietal area and then frontal was made.  Raney clips were applied to the edges.  The bone flap was elevated.  Three holes were made and they were connected with the craniotome.  The dura mater was attached to the inner-plate of the bone. Immediately, we found that the patient has a large subdural hematoma, which was evacuated.  Right at the center of the bone flap, there was already area compromise by the bleed with mix of brain tissue and hematoma.  Specimen was sent to the Laboratory.  In the meantime, we opened the tunnel into the brain itself following the hematoma and resection of the hematoma frontal and temporal was achieved.  There was an area, which was abnormal.  Specimen came back by frozen section, the possibility of a glioma.  From then on, the area was irrigated.  The brain was pulsatile.  We used DuraMatrix to cover the brain and the bone flap was pulled back in place using plates, three of them.  Then, the scalp flap was closed using Vicryl and staples.  Then, after the procedure, I removed her from the pins and  I was able to shave and cleaned the right frontal area.  An incision away from the midline and from the coronal suture was made.  A burr hole was made.  The dura mater was opened and I was able to introduce intraventricular catheter into the ventricular system.  The CSF came by at  high pressure.  The catheter was tunneled underneath the scalp and brought through a separate incision.  Incision was closed with Vicryl.  The patient is going to go to the intensive care unit.  The family are fully aware that the patient is going to be intubated for at least 3 days before we know the result of the test.  It was difficult to predict how well she is going to be.          ______________________________ Leeroy Cha, M.D.     EB/MEDQ  D:  08/18/2015  T:  08/18/2015  Job:  [id number]

## 2015-08-18 NOTE — Transfer of Care (Signed)
Immediate Anesthesia Transfer of Care Note  Patient: Sylvia Moore  Procedure(s) Performed: Procedure(s): CRANIOTOMY TUMOR EXCISION. Hemorrhage (Left)  Patient Location: ICU  Anesthesia Type:General  Level of Consciousness: sedated, unresponsive and Patient remains intubated per anesthesia plan  Airway & Oxygen Therapy: Patient remains intubated per anesthesia plan and Patient placed on Ventilator (see vital sign flow sheet for setting)  Post-op Assessment: Report given to RN and Post -op Vital signs reviewed and stable  Post vital signs: Reviewed and stable  Last Vitals:  Filed Vitals:   08/18/15 1130 08/18/15 1145  BP: 120/57 117/53  Pulse: 71 69  Temp:  35.5 C  Resp: 18 18    Complications: No apparent anesthesia complications

## 2015-08-18 NOTE — Progress Notes (Signed)
Patient ID: Sylvia Moore, female   DOB: 19-Nov-1950, 65 y.o.   MRN: [id number] Sedated, pupils 3 mms err. IVC fluctuating. Dropped to 10 cm h2o.

## 2015-08-18 NOTE — Progress Notes (Signed)
Unable to do EEG today. Pt will be unavailable

## 2015-08-18 NOTE — Progress Notes (Signed)
Responded to trauma page to support patient and family. Patient is support by her wife and Doristine Bosworth at bedside.. Patient gone to CT.  Provided ministry of presence,  empathetic listening and assistance to clergy.  Will follow as needed.

## 2015-08-18 NOTE — ED Notes (Signed)
Sylvia Moore with critical care at bedside.

## 2015-08-18 NOTE — Addendum Note (Signed)
Addendum  created 08/18/15 1934 by Roberts Gaudy, MD   Modules edited: Notes Section   Notes Section:  Delete: [id number]; File: [id number]

## 2015-08-18 NOTE — Consult Note (Signed)
Requesting Physician: Dr.  Regenia Skeeter     Reason for consultation:  Stroke code  HPI:                                                                                                                                         Sylvia Moore is an 65 y.o. female patient who  was brought into the emergency room with altered mental status, acute onset of slurred speech around 7:30 AM. EMS was called and was brought into the emergency room for evaluation. While she was on the CT scan  table, she started having vomiting, followed by a brief convulsive seizure involving her left upper and lower extremities more than the right side with left gaze deviation noted. The convulsive seizure lasted about a minute spontaneously resolved. He was given Ativan 1 mg at that time.  Later, her wife reported that patient has been not feeling well for the past couple of days reported headaches had reduced level of alertness and appear to be dazed. But she is not sure about any new symptoms such as vision or speech problems or any focal motor weakness. Patient unable to provide history due to altered mental status. Information obtained from patient's wife.    Due to worsening of her mental status and difficulty with protecting her airway, she was intubated in the emergency room.  CT of the head showed a large hemorrhagic lesion with small left convexity subdural hematoma. Neurosurgery was consulted for further evaluation.  Due to significant vasogenic edema surrounding this hemorrhagic lesion with mass effect and midline shift which appear to be rather unusual for an acute hemorrhage,a follow-up CT of the head with contrast as well as chest and abdomen CTs with contrast or performed. The follow-up CT showed increase in size of the hemorrhage, and also worsening subdural hemorrhage.  Then a follow-up CTA  head and neck were performed which did not show any definite evidence of vascular abnormality contributing to this  hemorrhage.  Date last known well:  2017, April  Time last known well:  At  7:30 AM tPA Given: No: ICH  Stroke Risk Factors - CHF EF 20%  Past Medical History: Past Medical History  Diagnosis Date  . Chronic systolic heart failure ([place])   . Depression   . Gout        . HOH (hard of hearing)     has hearing aids  . 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 Orthostatic hypotension        . NICM (nonischemic cardiomyopathy) ([place])     a. 2/2 radiation and chemotherapy b. echo 2/16  EF 20-25% c. non ischemic myoview 5/14 d. s/p STJ CRTD  . GERD (gastroesophageal reflux disease)   . LBBB (left bundle branch block)   . Pneumonia 1960's X 1; 2014  . Hypothyroidism     a. s/p  thyroid radiation  . History of blood transfusion ~ 1976    "related to cyst on my lung"  . Hepatitis ~ 1978    "got hepatitis w/mono; don't know which kind"  . Hodgkin's lymphoma ([place])     a. s/p rad' and chemo  in 1975-1977 b. s/p splenectomy 1970's    Past Surgical History  Procedure Laterality Date  . Hysteroscopy w/d&c    . Colonoscopy w/ polypectomy    . Lumbar fusion  07/2011    T11-L1 posterior lateral  . Dilation and curettage of uterus    . Abdominal exploration surgery  1975    r/t hodgkins disease - had chemo and rad  . Knee arthroscopy Left   . Colposcopy N/A 08/14/2012    Procedure: COLPOSCOPY;  Surgeon: [name], MD;  Location: [facility];  Service: Gynecology;  Laterality: N/A;  Colpo of Vulva and Vagina  . Lesion removal N/A 08/14/2012    Procedure: EXCISION VAGINAL LESION;  Surgeon: [name], MD;  Location: [facility];  Service: Gynecology;  Laterality: N/A;  Wide Local Excision of Right Labia Majori  . Rectal biopsy N/A 08/14/2012    Procedure: BIOPSY RECTAL;  Surgeon: [name], MD;  Location: [facility];  Service: Gynecology;  Laterality: N/A;  peri-anal biopsy  . Esophagogastroduodenoscopy (egd) with propofol N/A 01/20/2014    Procedure: ESOPHAGOGASTRODUODENOSCOPY (EGD)  WITH PROPOFOL;  Surgeon: [name], MD;  Location: [facility];  Service: Endoscopy;  Laterality: N/A;  . Colonoscopy with propofol N/A 01/20/2014    Procedure: COLONOSCOPY WITH PROPOFOL;  Surgeon: [name], MD;  Location: [facility];  Service: Endoscopy;  Laterality: N/A;  . Knee arthroscopy Right 2015    "torn meniscus"  . Left heart catheterization with coronary angiogram N/A 07/01/2014    Procedure: LEFT HEART CATHETERIZATION WITH CORONARY ANGIOGRAM;  Surgeon: [name], MD;  Location: [facility];  Service: Cardiovascular;  Laterality: N/A;  . Back surgery    . Laparoscopic cholecystectomy  ~ 2005  . Neck lesion biopsy Right 1975  . Splenectomy  1970's  . Bi-ventricular implantable cardioverter defibrillator N/A 07/08/2014    STJ Quadra Assura CRTD implanted by Dr Rayann Heman    Family History: Family History  Problem Relation Age of Onset  . Hypertension Father   . Stroke Father   . Heart failure Father   . Pneumonia Mother   . Healthy Sister   . Healthy Brother     Social History:   reports that she quit smoking about 47 years ago. Her smoking use included Cigarettes. She has a .25 pack-year smoking history. She has never used smokeless tobacco. She reports that she drinks about 1.8 oz of alcohol per week. She reports that she uses illicit drugs (Marijuana).  Allergies:  Allergies  Allergen Reactions  . Codeine Nausea And Vomiting     Medications:  Current facility-administered medications:  .  dexamethasone (DECADRON) injection 10 mg, 10 mg, Intravenous, Once, Beadie Matsunaga Fuller Mandril, MD .  etomidate (AMIDATE) injection, , Intravenous, PRN, Sherwood Gambler, MD, 20 mg at 08/18/15 0912 .  fosPHENYtoin (CEREBYX) 1,500 mg PE in sodium chloride 0.9 % 50 mL IVPB, 1,500 mg PE, Intravenous, STAT, Demani Mcbrien Fuller Mandril, MD .  LORazepam  (ATIVAN) 2 MG/ML injection, , , ,  .  nicardipine (CARDENE) 20mg  in 0.86% saline 256ml IV infusion (0.1 mg/ml), 3-15 mg/hr, Intravenous, Continuous, Emunah Texidor Fuller Mandril, MD .  ondansetron [place]) 4 MG/2ML injection, , , ,  .  propofol (DIPRIVAN) 1000 MG/100ML infusion, , , ,   Current outpatient prescriptions:  .  allopurinol (ZYLOPRIM) 100 MG tablet, Take 100 mg by mouth 2 (two) times daily., Disp: , Rfl:  .  carvedilol (COREG) 12.5 MG tablet, Take 0.5 tablets (6.25 mg total) by mouth 2 (two) times daily with a meal., Disp: 60 tablet, Rfl: 6 .  CORLANOR 5 MG TABS tablet, TAKE 1 & 1/2 TABLETS TWICE DAILY WITH MEALS., Disp: 90 tablet, Rfl: 10 .  furosemide (LASIX) 20 MG tablet, TAKE 1 TABLET ONCE DAILY., Disp: 30 tablet, Rfl: 6 .  levothyroxine (SYNTHROID, LEVOTHROID) 125 MCG tablet, Take 125 mcg by mouth daily. Reported on 05/22/2015, Disp: , Rfl:  .  Multiple Vitamins-Minerals (MULTIVITAMIN PO), Take 1 tablet by mouth daily., Disp: , Rfl:  .  omeprazole (PRILOSEC OTC) 20 MG tablet, Take 20 mg by mouth at bedtime. , Disp: , Rfl:  .  valsartan (DIOVAN) 40 MG tablet, TAKE 1 TABLET ONCE DAILY., Disp: 30 tablet, Rfl: 6 .  venlafaxine XR (EFFEXOR-XR) 37.5 MG 24 hr capsule, Take 37.5 mg by mouth daily with breakfast. , Disp: , Rfl:  .  nitroGLYCERIN (NITROSTAT) 0.4 MG SL tablet, Place 1 tablet (0.4 mg total) under the tongue every 5 (five) minutes as needed for chest pain., Disp: 25 tablet, Rfl: 3   ROS:                                                                                                                                       History  unobtainable from patient due to mental status    Neurologic Examination:                                                                                                    Today's Vitals   08/18/15 0907 08/18/15 0908  BP:  153/95  Pulse: 77  Resp: 18   SpO2: 97%      initially when she was seen in the CT scanner room, patient was very drowsy,  not following commands. No specific gaze deviation or nystagmus no abnormal involuntary movements . She is able to withdraw all extremities, less on the left side.  Then she had a partial seizure while on CT scanner table, noted to have left gaze deviation with left upper and lower extremity convulsive activity lasting a minute, and was very drowsy unresponsive in postictal state   After the CT scan, while in the ER, patient continued to be very drowsy, intubated for airway protection. She had subtle anisocoria with the right pupil 8-9 mm, left pupil 6-7 mm both are reactive, corneal's intact present. No motor response during my evaluation, but intermittently with nursing staff evaluation, she would withdraw her limbs.   Lab Results: Basic Metabolic Panel:  Recent Labs Lab 08/18/15 0832 08/18/15 0840  NA 137 140  K 4.0 4.0  CL 104 103  CO2 24  --   GLUCOSE 132* 130*  BUN 18 22*  CREATININE 0.89 0.80  CALCIUM 10.0  --     Liver Function Tests:  Recent Labs Lab 08/18/15 0832  AST 36  ALT 26  ALKPHOS 85  BILITOT 0.7  PROT 8.4*  ALBUMIN 3.8   No results for input(s): LIPASE, AMYLASE in the last 168 hours. No results for input(s): AMMONIA in the last 168 hours.  CBC:  Recent Labs Lab 08/18/15 0832 08/18/15 0840  WBC 8.4  --   NEUTROABS 4.1  --   HGB 13.5 14.6  HCT 40.3 43.0  MCV 98.8  --   PLT 388  --     Cardiac Enzymes: No results for input(s): CKTOTAL, CKMB, CKMBINDEX, TROPONINI in the last 168 hours.  Lipid Panel: No results for input(s): CHOL, TRIG, HDL, CHOLHDL, VLDL, LDLCALC in the last 168 hours.  CBG: No results for input(s): GLUCAP in the last 168 hours.  Microbiology: Results for orders placed or performed during the hospital encounter of 06/30/11  Surgical pcr screen     Status: None   Collection Time: 06/30/11  9:45 AM  Result Value Ref Range Status   MRSA, PCR NEGATIVE NEGATIVE Final   Staphylococcus aureus NEGATIVE NEGATIVE Final     Comment:        The Xpert SA Assay (FDA approved for NASAL specimens only), is one component of a comprehensive surveillance program.  It is not intended to diagnose infection nor to guide or monitor treatment.     Imaging: Ct Head Wo Contrast  08/18/2015  CLINICAL DATA:  Code stroke for disorientation. EXAM: CT HEAD WITHOUT CONTRAST TECHNIQUE: Contiguous axial images were obtained from the base of the skull through the vertex without intravenous contrast. COMPARISON:  None. FINDINGS: Skull and Sinuses:No posttraumatic or aggressive finding. Clear visualized paranasal sinuses and mastoids. Visualized orbits: Negative. Brain: Heterogeneous hemorrhage in the posterior left frontal lobe with a prominent rim of vasogenic type edema. Given the patchy hemorrhage, volume estimation is confounded. Maximal axial dimension is 31 x 26 mm. There is local subarachnoid and subdural extension. The subdural hemorrhage measures up to 6 mm in thickness. Inferiorly, there is a high-density 27 mm area which is homogeneous and less dense than the patchy hematoma, possible background mass. Midline shift at 6 mm. No ventricular entrapment. No evidence of acute infarct. No other focal finding. Patient has biventricular pacer. Head CT with and without contrast is recommended. Critical Value/emergent  results were called by telephone at the time of interpretation on 08/18/2015 at 9:03 am to Dr. Silverio Decamp , who was already aware of these findings. IMPRESSION: ~ 3cm left frontal parenchymal hematoma with subarachnoid and subdural extension. There may be an underlying mass and CT with and without contrast is recommended. 6 mm midline shift. Electronically Signed   By: Monte Fantasia M.D.   On: 08/18/2015 09:09     Stroke Scales   ICH Score:  3          Pre stroke Modified Rankin Scale (mRS): 0    Assessment and plan:   ANNAH SLAPPEY is an 65 y.o. female patient who presented with altered mental status, had a  brief partial seizure while in the CT scanner, noted to have large left frontal parietotemporal intracerebral hemorrhage with significant surrounding vasogenic edema, with mass effect and midline shift, left convexity subdural hematoma, and subarachnoid hemorrhage. She is intubated in the ER for airway protection. Ordered loading dose of IV fosphenytoin 1500 mg, and started on maintenance dose of Keppra 1500 mg twice a day for seizures. Was given Decadron 10 mg initially due to concern for underlying mass lesion due to the significant vasogenic edema noted. Follow-up CT scans with contrast of the head chest and abdomen did not reveal any definite evidence of malignancy. Worsening of the intracranial hemorrhage noted on the follow-up scan suggestive of an active ongoing bleeding. CTA of head and neck did not reveal any vascular abnormality contributing to this hemorrhage. Patient evaluated by neurosurgeon, Dr. Joya Salm. She underwent craniectomy for surgical evacuation of the hemorrhage, and admitted to neuro ICU for postoperative care. Ordered repeat CT of the head for tomorrow morning.   Stroke team will continue to follow starting tomorrow morning.

## 2015-08-18 NOTE — ED Provider Notes (Signed)
CSN: [id number]     Arrival date & time 08/18/15  [id number] History   First MD Initiated Contact with Patient 08/18/15 [phone]     Chief Complaint  Patient presents with  . Altered Mental Status   LEVEL 5 CAVEAT for ALTERED MENTAL STATUS  (Consider location/radiation/quality/duration/timing/severity/associated sxs/prior Treatment) HPI  65 year old female presents as a code stroke. The patient is currently altered and thus history is obtained from EMS as well as the wife. The patient apparently had an acute onset headache this morning shortly after awakening. She was transiently disoriented. During EMS transport the patient's mental status seemed to improve. There was never any focal deficits per them. When first arriving patient was awake and alert according to staff and well appearing. Airway intact. Patient then went to the CT scanner for stroke protocol. While in the scanner she had a left-sided partial seizure and vomited. She was given 1 mg Ativan. CT scan was obtained and then she was taken to her treatment room.  Wife states patient has been having on and off headaches but these have improved since her BP has been better controlled by her cardiologist.  Past Medical History  Diagnosis Date  . Chronic systolic heart failure ([place])   . Depression   . Gout        . HOH (hard of hearing)     has hearing aids  . 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 Orthostatic hypotension        . NICM (nonischemic cardiomyopathy) ([place])     a. 2/2 radiation and chemotherapy b. echo 2/16  EF 20-25% c. non ischemic myoview 5/14 d. s/p STJ CRTD  . GERD (gastroesophageal reflux disease)   . LBBB (left bundle branch block)   . Pneumonia 1960's X 1; 2014  . Hypothyroidism     a. s/p thyroid radiation  . History of blood transfusion ~ 1976    "related to cyst on my lung"  . Hepatitis ~ 1978    "got hepatitis w/mono; don't know which kind"  . Hodgkin's lymphoma ([place])     a. s/p rad' and chemo  in  1975-1977 b. s/p splenectomy 1970's   Past Surgical History  Procedure Laterality Date  . Hysteroscopy w/d&c    . Colonoscopy w/ polypectomy    . Lumbar fusion  07/2011    T11-L1 posterior lateral  . Dilation and curettage of uterus    . Abdominal exploration surgery  1975    r/t hodgkins disease - had chemo and rad  . Knee arthroscopy Left   . Colposcopy N/A 08/14/2012    Procedure: COLPOSCOPY;  Surgeon: [name], MD;  Location: [facility];  Service: Gynecology;  Laterality: N/A;  Colpo of Vulva and Vagina  . Lesion removal N/A 08/14/2012    Procedure: EXCISION VAGINAL LESION;  Surgeon: [name], MD;  Location: [facility];  Service: Gynecology;  Laterality: N/A;  Wide Local Excision of Right Labia Majori  . Rectal biopsy N/A 08/14/2012    Procedure: BIOPSY RECTAL;  Surgeon: [name], MD;  Location: [facility];  Service: Gynecology;  Laterality: N/A;  peri-anal biopsy  . Esophagogastroduodenoscopy (egd) with propofol N/A 01/20/2014    Procedure: ESOPHAGOGASTRODUODENOSCOPY (EGD) WITH PROPOFOL;  Surgeon: [name], MD;  Location: [facility];  Service: Endoscopy;  Laterality: N/A;  . Colonoscopy with propofol N/A 01/20/2014    Procedure: COLONOSCOPY WITH PROPOFOL;  Surgeon: [name], MD;  Location: [facility];  Service: Endoscopy;  Laterality: N/A;  . Knee arthroscopy Right 2015    "torn meniscus"  . Left heart catheterization with coronary angiogram N/A 07/01/2014    Procedure: LEFT HEART CATHETERIZATION WITH CORONARY ANGIOGRAM;  Surgeon: [name], MD;  Location: [facility];  Service: Cardiovascular;  Laterality: N/A;  . Back surgery    . Laparoscopic cholecystectomy  ~ 2005  . Neck lesion biopsy Right 1975  . Splenectomy  1970's  . Bi-ventricular implantable cardioverter defibrillator N/A 07/08/2014    STJ Quadra Assura CRTD implanted by Dr Rayann Heman   Family History  Problem Relation Age of Onset  . Hypertension Father   . Stroke Father   . Heart failure Father     . Pneumonia Mother   . Healthy Sister   . Healthy Brother    Social History  Substance Use Topics  . Smoking status: Former Smoker -- 0.25 packs/day for 1 years    Types: Cigarettes    Quit date: 05/02/1968  . Smokeless tobacco: Never Used  . Alcohol Use: 1.8 oz/week    3 Shots of liquor per week     Comment: weekends; if that   OB History    Gravida Para Term Preterm AB TAB SAB Ectopic Multiple Living   1 0             Review of Systems  Unable to perform ROS: Patient unresponsive      Allergies  Codeine  Home Medications   Prior to Admission medications   Medication Sig Start Date End Date Taking? Authorizing Provider  allopurinol (ZYLOPRIM) 100 MG tablet Take 100 mg by mouth 2 (two) times daily.   Yes Historical Provider, MD  levothyroxine (SYNTHROID, LEVOTHROID) 125 MCG tablet Take 125 mcg by mouth daily. Reported on 05/22/2015   Yes Historical Provider, MD  Multiple Vitamins-Minerals (MULTIVITAMIN PO) Take 1 tablet by mouth daily.   Yes Historical Provider, MD  omeprazole (PRILOSEC OTC) 20 MG tablet Take 20 mg by mouth at bedtime.    Yes Historical Provider, MD  venlafaxine XR (EFFEXOR-XR) 37.5 MG 24 hr capsule Take 37.5 mg by mouth daily with breakfast.    Yes Historical Provider, MD  carvedilol (COREG) 12.5 MG tablet Take 0.5 tablets (6.25 mg total) by mouth 2 (two) times daily with a meal. 06/21/14   Eileen Stanford, PA-C  CORLANOR 5 MG TABS tablet TAKE 1 & 1/2 TABLETS TWICE DAILY WITH MEALS. 03/23/15   Thompson Grayer, MD  furosemide (LASIX) 20 MG tablet TAKE 1 TABLET ONCE DAILY. 07/15/15   [name], MD  nitroGLYCERIN (NITROSTAT) 0.4 MG SL tablet Place 1 tablet (0.4 mg total) under the tongue every 5 (five) minutes as needed for chest pain. 06/28/14   Rhonda G Barrett, PA-C  valsartan (DIOVAN) 40 MG tablet TAKE 1 TABLET ONCE DAILY. 07/17/15   [name], MD   There were no vitals taken for this visit. Physical Exam  Constitutional: She appears  well-developed and well-nourished. She appears lethargic.  HENT:  Head: Normocephalic and atraumatic.  Right Ear: External ear normal.  Left Ear: External ear normal.  Nose: Nose normal.  Eyes: Pupils are equal, round, and reactive to light. Right eye exhibits no discharge. Left eye exhibits no discharge.  Gaze preference to left  Cardiovascular: Normal rate, regular rhythm and normal heart sounds.   Pulmonary/Chest: Effort normal and breath sounds normal.    Abdominal: Soft. There is no tenderness.  Neurological: She appears lethargic. GCS eye subscore is 3. GCS  verbal subscore is 2. GCS motor subscore is 5.  Lethargic but arouses to voice and light touch. Tracks with eyes but does not move much past midline to right. No obvious facial droop. Mumbles occasionally but does not really speak. Moves both upper extremities to command but right is definitely weaker than left. Does not respond to pain in lower extremities or move them to command  Skin: Skin is warm and dry.  Nursing note and vitals reviewed.   ED Course  .Intubation Date/Time: 08/18/2015 9:17 AM Performed by: Sherwood Gambler Authorized by: Sherwood Gambler Consent: The procedure was performed in an emergent situation. Time out: Immediately prior to procedure a "time out" was called to verify the correct patient, procedure, equipment, support staff and site/side marked as required. Indications: respiratory failure and  airway protection Intubation method: video-assisted Patient status: paralyzed (RSI) Preoxygenation: nonrebreather mask Sedatives: etomidate Paralytic: rocuronium Tube size: 7.5 mm Tube type: cuffed Number of attempts: 1 Cricoid pressure: no Cords visualized: yes Post-procedure assessment: chest rise and CO2 detector Breath sounds: equal Cuff inflated: yes ETT to lip: 25 cm Tube secured with: ETT holder Chest x-ray interpreted by me. Chest x-ray findings: endotracheal tube in appropriate  position Patient tolerance: Patient tolerated the procedure well with no immediate complications   (including critical care time) Labs Review Labs Reviewed  COMPREHENSIVE METABOLIC PANEL - Abnormal; Notable for the following:    Glucose, Bld 132 (*)    Total Protein 8.4 (*)    All other components within normal limits  URINALYSIS, ROUTINE W REFLEX MICROSCOPIC (NOT AT [facility]) - Abnormal; Notable for the following:    Specific Gravity, Urine >1.046 (*)    Hgb urine dipstick TRACE (*)    Ketones, ur 15 (*)    All other components within normal limits  URINE MICROSCOPIC-ADD ON - Abnormal; Notable for the following:    Squamous Epithelial / LPF 0-5 (*)    Bacteria, UA RARE (*)    All other components within normal limits  I-STAT CHEM 8, ED - Abnormal; Notable for the following:    BUN 22 (*)    Glucose, Bld 130 (*)    All other components within normal limits  I-STAT ARTERIAL BLOOD GAS, ED - Abnormal; Notable for the following:    pO2, Arterial 376.0 (*)    Bicarbonate 29.7 (*)    Acid-Base Excess 5.0 (*)    All other components within normal limits  POCT I-STAT 7, (LYTES, BLD GAS, ICA,H+H) - Abnormal; Notable for the following:    pO2, Arterial 290.0 (*)    Bicarbonate 26.3 (*)    All other components within normal limits  ETHANOL  PROTIME-INR  APTT  CBC  DIFFERENTIAL  URINE RAPID DRUG SCREEN, HOSP PERFORMED  TRIGLYCERIDES  TSH  I-STAT TROPOININ, ED  TYPE AND SCREEN  PREPARE RBC (CROSSMATCH)    Imaging Review Ct Head Wo Contrast  08/18/2015  CLINICAL DATA:  Code stroke for disorientation. EXAM: CT HEAD WITHOUT CONTRAST TECHNIQUE: Contiguous axial images were obtained from the base of the skull through the vertex without intravenous contrast. COMPARISON:  None. FINDINGS: Skull and Sinuses:No posttraumatic or aggressive finding. Clear visualized paranasal sinuses and mastoids. Visualized orbits: Negative. Brain: Heterogeneous hemorrhage in the posterior left frontal lobe with a  prominent rim of vasogenic type edema. Given the patchy hemorrhage, volume estimation is confounded. Maximal axial dimension is 31 x 26 mm. There is local subarachnoid and subdural extension. The subdural hemorrhage measures up to 6 mm in thickness. Inferiorly,  there is a high-density 27 mm area which is homogeneous and less dense than the patchy hematoma, possible background mass. Midline shift at 6 mm. No ventricular entrapment. No evidence of acute infarct. No other focal finding. Patient has biventricular pacer. Head CT with and without contrast is recommended. Critical Value/emergent results were called by telephone at the time of interpretation on 08/18/2015 at 9:03 am to Dr. Silverio Decamp , who was already aware of these findings. IMPRESSION: ~ 3cm left frontal parenchymal hematoma with subarachnoid and subdural extension. There may be an underlying mass and CT with and without contrast is recommended. 6 mm midline shift. Electronically Signed   By: Monte Fantasia M.D.   On: 08/18/2015 09:09   Ct Head W Contrast  08/18/2015  CLINICAL DATA:  Sudden onset altered mental status. Aphasia. History lymphoma EXAM: CT HEAD WITH CONTRAST TECHNIQUE: Contiguous axial images were obtained from the base of the skull through the vertex with intravenous contrast. CONTRAST:  139mL ISOVUE-300 IOPAMIDOL (ISOVUE-300) INJECTION 61% COMPARISON:  CT head 08/18/2015, 1 hour prior to the enhanced study. FINDINGS: Left cerebral hemorrhage has progressed in the 1 hour between the 2 studies. There is high-density extravasation of contrast in the left frontal operculum. Majority of this hemorrhages intraparenchymal. There is progressive hemorrhage in the left parietal operculum. There is low-density edema in the left frontal parietal lobe which appears to have progressed in the interval. There is a small amount of subarachnoid hemorrhage in the left sylvian fissure. Left subdural hemorrhage has progressed since the prior study. This is  felt to be due to rupture of the parenchymal hemorrhage into the subdural space. Increase in midline shift to the right now measuring 11 mm. Ventricles are not dilated. No skull lesion IMPRESSION: Large area of hemorrhage in the left hemisphere has progressed since 1 hour ago. There is parenchymal hemorrhage with active extravasation in the left frontal operculum. There is also increase in left hemispheric subdural hemorrhage. There is a small amount of subarachnoid hemorrhage in the left sylvian fissure. Increase in low-density edema on the left frontal lobe. Increase in midline shift which is now 11 mm to the right Differential diagnosis includes ruptured aneurysm including mycotic aneurysm. Ruptured vascular malformation such as AVM possible. Hypertensive hemorrhage is possible. Other possibilities would include hemorrhagic infarction. Critical Value/emergent results were called by telephone at the time of interpretation on 08/18/2015 at 10:11 am to Dr. Audria Nine , who verbally acknowledged these results. Electronically Signed   By: Franchot Gallo M.D.   On: 08/18/2015 10:15   Ct Chest W Contrast  08/18/2015  CLINICAL DATA:  Abnormal head CT. History of chest mass and lymphoma. EXAM: CT CHEST, ABDOMEN, AND PELVIS WITH CONTRAST TECHNIQUE: Multidetector CT imaging of the chest, abdomen and pelvis was performed following the standard protocol during bolus administration of intravenous contrast. CONTRAST:  148mL ISOVUE-300 IOPAMIDOL (ISOVUE-300) INJECTION 61% COMPARISON:  04/07/2011 abdominal CT FINDINGS: CT CHEST THORACIC INLET/BODY WALL: Intubation with endotracheal tube tip above the carina. MEDIASTINUM: Biventricular pacer from the left with leads in unremarkable position. No acute vascular finding. No lymphadenopathy. Low-density mass along the left T3 intercostal nerve is stable or mildly progressed over years (2014 comparison) with 18 mm thickness and 32 mm length. 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 No  aggressive features. LUNG WINDOWS: Low volume chest with perihilar streaky and reticular opacities consistent with atelectasis. No central airway obstruction. No suspicious nodules. OSSEOUS: Remote T12 body fracture with kyphosis. The fracture has been fixed by T11-L1  posterior fixation. CT ABDOMEN AND PELVIS Lower chest and abdominal wall:  No contributory findings. Hepatobiliary: No focal liver abnormality.Cholecystectomy with negative common bile duct Pancreas: Stable appearance Spleen: Surgically absent Adrenals/Urinary Tract: Negative adrenals. Left upper pole atrophy, likely compromise of an accessory renal artery. Multiple left renal cortical cysts no hydronephrosis. Negative bladder. Reproductive:Essentially stable 55 mm left fundal fibroid. Tubal ligation clips. Stomach/Bowel:  No obstruction. No arthritic changes. Vascular/Lymphatic: No acute vascular abnormality. Presumed lymphadenectomy clips in the retroperitoneum. Stable prominent lymph nodes along the left gastric, benign given long interval. Peritoneal: No ascites or pneumoperitoneum. Musculoskeletal: No acute or aggressive process. IMPRESSION: 1. No evidence of primary malignancy or metastatic disease in the chest or abdomen. 2. 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 3. Low volume chest with bilateral atelectasis. 4. Chronic findings are stable and described above. Electronically Signed   By: Monte Fantasia M.D.   On: 08/18/2015 10:13   Ct Abdomen Pelvis W Contrast  08/18/2015  CLINICAL DATA:  Abnormal head CT. History of chest mass and lymphoma. EXAM: CT CHEST, ABDOMEN, AND PELVIS WITH CONTRAST TECHNIQUE: Multidetector CT imaging of the chest, abdomen and pelvis was performed following the standard protocol during bolus administration of intravenous contrast. CONTRAST:  155mL ISOVUE-300 IOPAMIDOL (ISOVUE-300) INJECTION 61% COMPARISON:  04/07/2011 abdominal CT FINDINGS: CT CHEST THORACIC  INLET/BODY WALL: Intubation with endotracheal tube tip above the carina. MEDIASTINUM: Biventricular pacer from the left with leads in unremarkable position. No acute vascular finding. No lymphadenopathy. Low-density mass along the left T3 intercostal nerve is stable or mildly progressed over years (2014 comparison) with 18 mm thickness and 32 mm length. 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 No aggressive features. LUNG WINDOWS: Low volume chest with perihilar streaky and reticular opacities consistent with atelectasis. No central airway obstruction. No suspicious nodules. OSSEOUS: Remote T12 body fracture with kyphosis. The fracture has been fixed by T11-L1 posterior fixation. CT ABDOMEN AND PELVIS Lower chest and abdominal wall:  No contributory findings. Hepatobiliary: No focal liver abnormality.Cholecystectomy with negative common bile duct Pancreas: Stable appearance Spleen: Surgically absent Adrenals/Urinary Tract: Negative adrenals. Left upper pole atrophy, likely compromise of an accessory renal artery. Multiple left renal cortical cysts no hydronephrosis. Negative bladder. Reproductive:Essentially stable 55 mm left fundal fibroid. Tubal ligation clips. Stomach/Bowel:  No obstruction. No arthritic changes. Vascular/Lymphatic: No acute vascular abnormality. Presumed lymphadenectomy clips in the retroperitoneum. Stable prominent lymph nodes along the left gastric, benign given long interval. Peritoneal: No ascites or pneumoperitoneum. Musculoskeletal: No acute or aggressive process. IMPRESSION: 1. No evidence of primary malignancy or metastatic disease in the chest or abdomen. 2. 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 3. Low volume chest with bilateral atelectasis. 4. Chronic findings are stable and described above. Electronically Signed   By: Monte Fantasia M.D.   On: 08/18/2015 10:13   Dg Chest Portable 1 View  08/18/2015  CLINICAL DATA:  Endotracheal and  nasogastric tube placements, intubation, chronic systolic heart failure, non ischemic cardiomyopathy, former smoker, personal history non-Hodgkin's lymphoma EXAM: PORTABLE CHEST 1 VIEW COMPARISON:  Portable exam 0921 hours compared to 05/22/2015 FINDINGS: Tip of endotracheal tube projects 3.4 cm above carina. Nasogastric tube extends into stomach. LEFT subclavian pacemaker/AICD leads project over RIGHT atrium, RIGHT ventricle and coronary sinus. Normal heart size, mediastinal contours and pulmonary vascularity. Atherosclerotic calcification aorta. Bibasilar atelectasis. No definite infiltrate, pleural effusion or pneumothorax. IMPRESSION: Bibasilar atelectasis. Tube positions as above. Electronically Signed  By: Lavonia Dana M.D.   On: 08/18/2015 09:51   I have personally reviewed and evaluated these images and lab results as part of my medical decision-making.   EKG Interpretation   Date/Time:  Tuesday August 18 2015 09:07:07 EDT Ventricular Rate:  70 PR Interval:  188 QRS Duration: 117 QT Interval:  455 QTC Calculation: 491 R Axis:   -53 Text Interpretation:  Sinus rhythm Probable left atrial enlargement Left  anterior fascicular block Low voltage, precordial leads Probable lateral  infarct, old Baseline wander in lead(s) II aVR aVF V1 V2 V3 V4 V5 V6  Confirmed by Tyronica Truxillo  MD, Twain Stenseth ([id number]) on 08/18/2015 10:09:46 AM      CRITICAL CARE Performed by: Sherwood Gambler T   Total critical care time: 40 minutes  Critical care time was exclusive of separately billable procedures and treating other patients.  Critical care was necessary to treat or prevent imminent or life-threatening deterioration.  Critical care was time spent personally by me on the following activities: development of treatment plan with patient and/or surrogate as well as nursing, discussions with consultants, evaluation of patient's response to treatment, examination of patient, obtaining history from patient or  surrogate, ordering and performing treatments and interventions, ordering and review of laboratory studies, ordering and review of radiographic studies, pulse oximetry and re-evaluation of patient's condition.  MDM   Final diagnoses:  Intraparenchymal hemorrhage of brain (HCC)  Acute respiratory failure, unspecified whether with hypoxia or hypercapnia [facility])    Patient initially evaluated by me after coming back from the [place]. She had been given 1 mg Ativan. She is lethargic and while she arouses to verbal stimulation she seems to be getting more lethargic while in the ED. Due to mass/bleeding intraparenchymally she was intubated for airway protection given recent vomiting. Discussed this with wife at the bedside. Neuro and neurosurgery consulted. Dr Joya Salm will evaluate after CT head/chest/abdomen/pelvis to evaluate for other masses or cancer.  Repeat CT shows worse bleeding and no primary seen on other scans. Dr. Joya Salm has seen and talked to family. Plans to take to OR today. BP controlled on propofol drip. No other obvious seizure activity. Family updated. Admit to CCM in ICU  Right pupil now larger than left. Will take for CTA head/neck and then Joya Salm will take emergently to OR.    Sherwood Gambler, MD 08/18/15 1304

## 2015-08-18 NOTE — H&P (Signed)
Sylvia Moore is an 65 y.o. female.   Chief Complaint: seizure HPI: patient was brought to the er with history of headache ,dizziness. While in the er she developed a seizure activity, vomited and was intubated.3 ct of the head were done which showed a mass in the left temporo-frontal area with subdural hematoma and shift. Ct angio did not show avm or aneurysm.   Past Medical History  Diagnosis Date  . Chronic systolic heart failure ([place])   . Depression   . Gout        . HOH (hard of hearing)     has hearing aids  . 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 Orthostatic hypotension        . NICM (nonischemic cardiomyopathy) ([place])     a. 2/2 radiation and chemotherapy b. echo 2/16  EF 20-25% c. non ischemic myoview 5/14 d. s/p STJ CRTD  . GERD (gastroesophageal reflux disease)   . LBBB (left bundle branch block)   . Pneumonia 1960's X 1; 2014  . Hypothyroidism     a. s/p thyroid radiation  . History of blood transfusion ~ 1976    "related to cyst on my lung"  . Hepatitis ~ 1978    "got hepatitis w/mono; don't know which kind"  . Hodgkin's lymphoma ([place])     a. s/p rad' and chemo  in 1975-1977 b. s/p splenectomy 1970's    Past Surgical History  Procedure Laterality Date  . Hysteroscopy w/d&c    . Colonoscopy w/ polypectomy    . Lumbar fusion  07/2011    T11-L1 posterior lateral  . Dilation and curettage of uterus    . Abdominal exploration surgery  1975    r/t hodgkins disease - had chemo and rad  . Knee arthroscopy Left   . Colposcopy N/A 08/14/2012    Procedure: COLPOSCOPY;  Surgeon: [name], MD;  Location: [facility];  Service: Gynecology;  Laterality: N/A;  Colpo of Vulva and Vagina  . Lesion removal N/A 08/14/2012    Procedure: EXCISION VAGINAL LESION;  Surgeon: [name], MD;  Location: [facility];  Service: Gynecology;  Laterality: N/A;  Wide Local Excision of Right Labia Majori  . Rectal biopsy N/A 08/14/2012    Procedure: BIOPSY RECTAL;  Surgeon: [name], MD;  Location: [facility];  Service: Gynecology;  Laterality: N/A;  peri-anal biopsy  . Esophagogastroduodenoscopy (egd) with propofol N/A 01/20/2014    Procedure: ESOPHAGOGASTRODUODENOSCOPY (EGD) WITH PROPOFOL;  Surgeon: [name], MD;  Location: [facility];  Service: Endoscopy;  Laterality: N/A;  . Colonoscopy with propofol N/A 01/20/2014    Procedure: COLONOSCOPY WITH PROPOFOL;  Surgeon: [name], MD;  Location: [facility];  Service: Endoscopy;  Laterality: N/A;  . Knee arthroscopy Right 2015    "torn meniscus"  . Left heart catheterization with coronary angiogram N/A 07/01/2014    Procedure: LEFT HEART CATHETERIZATION WITH CORONARY ANGIOGRAM;  Surgeon: [name], MD;  Location: [facility] CATH LAB;  Service: Cardiovascular;  Laterality: N/A;  . Back surgery    . Laparoscopic cholecystectomy  ~ 2005  . Neck lesion biopsy Right 1975  . Splenectomy  1970's  . Bi-ventricular implantable cardioverter defibrillator N/A 07/08/2014    STJ Quadra Assura CRTD implanted by Dr Rayann Heman    Family History  Problem Relation Age of Onset  . Hypertension Father   . Stroke Father   . Heart failure Father   . Pneumonia Mother   .  Healthy Sister   . Healthy Brother    Social History:  reports that she quit smoking about 47 years ago. Her smoking use included Cigarettes. She has a .25 pack-year smoking history. She has never used smokeless tobacco. She reports that she drinks about 1.8 oz of alcohol per week. She reports that she uses illicit drugs (Marijuana).  Allergies:  Allergies  Allergen Reactions  . Codeine Nausea And Vomiting     (Not in a hospital admission)  Results for orders placed or performed during the hospital encounter of 08/18/15 (from the past 48 hour(s))  Ethanol     Status: None   Collection Time: 08/18/15  8:32 AM  Result Value Ref Range   Alcohol, Ethyl (B) <5 <5 mg/dL    Comment:        LOWEST DETECTABLE LIMIT FOR SERUM ALCOHOL IS 5 mg/dL FOR MEDICAL  PURPOSES ONLY   Protime-INR     Status: None   Collection Time: 08/18/15  8:32 AM  Result Value Ref Range   Prothrombin Time 14.6 11.6 - 15.2 seconds   INR 1.12 0.00 - 1.49  APTT     Status: None   Collection Time: 08/18/15  8:32 AM  Result Value Ref Range   aPTT 30 24 - 37 seconds  CBC     Status: None   Collection Time: 08/18/15  8:32 AM  Result Value Ref Range   WBC 8.4 4.0 - 10.5 K/uL   RBC 4.08 3.87 - 5.11 MIL/uL   Hemoglobin 13.5 12.0 - 15.0 g/dL   HCT 40.3 36.0 - 46.0 %   MCV 98.8 78.0 - 100.0 fL   MCH 33.1 26.0 - 34.0 pg   MCHC 33.5 30.0 - 36.0 g/dL   RDW 13.0 11.5 - 15.5 %   Platelets 388 150 - 400 K/uL  Differential     Status: None   Collection Time: 08/18/15  8:32 AM  Result Value Ref Range   Neutrophils Relative % 48 %   Neutro Abs 4.1 1.7 - 7.7 K/uL   Lymphocytes Relative 39 %   Lymphs Abs 3.3 0.7 - 4.0 K/uL   Monocytes Relative 9 %   Monocytes Absolute 0.7 0.1 - 1.0 K/uL   Eosinophils Relative 3 %   Eosinophils Absolute 0.3 0.0 - 0.7 K/uL   Basophils Relative 1 %   Basophils Absolute 0.1 0.0 - 0.1 K/uL  Comprehensive metabolic panel     Status: Abnormal   Collection Time: 08/18/15  8:32 AM  Result Value Ref Range   Sodium 137 135 - 145 mmol/L   Potassium 4.0 3.5 - 5.1 mmol/L   Chloride 104 101 - 111 mmol/L   CO2 24 22 - 32 mmol/L   Glucose, Bld 132 (H) 65 - 99 mg/dL   BUN 18 6 - 20 mg/dL   Creatinine, Ser 0.89 0.44 - 1.00 mg/dL   Calcium 10.0 8.9 - 10.3 mg/dL   Total Protein 8.4 (H) 6.5 - 8.1 g/dL   Albumin 3.8 3.5 - 5.0 g/dL   AST 36 15 - 41 U/L   ALT 26 14 - 54 U/L   Alkaline Phosphatase 85 38 - 126 U/L   Total Bilirubin 0.7 0.3 - 1.2 mg/dL   GFR calc non Af Amer >60 >60 mL/min   GFR calc Af Amer >60 >60 mL/min    Comment: (NOTE) The eGFR has been calculated using the CKD EPI equation. This calculation has not been validated in all clinical situations. eGFR's persistently <60  mL/min signify possible Chronic Kidney Disease.    Anion gap 9 5  - 15  I-stat troponin, ED (not at [facility], [facility])     Status: None   Collection Time: 08/18/15  8:38 AM  Result Value Ref Range   Troponin i, poc 0.00 0.00 - 0.08 ng/mL   Comment 3            Comment: Due to the release kinetics of cTnI, a negative result within the first hours of the onset of symptoms does not rule out myocardial infarction with certainty. If myocardial infarction is still suspected, repeat the test at appropriate intervals.   I-Stat Chem 8, ED  (not at [facility], [facility])     Status: Abnormal   Collection Time: 08/18/15  8:40 AM  Result Value Ref Range   Sodium 140 135 - 145 mmol/L   Potassium 4.0 3.5 - 5.1 mmol/L   Chloride 103 101 - 111 mmol/L   BUN 22 (H) 6 - 20 mg/dL   Creatinine, Ser 0.80 0.44 - 1.00 mg/dL   Glucose, Bld 130 (H) 65 - 99 mg/dL   Calcium, Ion 1.19 1.13 - 1.30 mmol/L   TCO2 25 0 - 100 mmol/L   Hemoglobin 14.6 12.0 - 15.0 g/dL   HCT 43.0 36.0 - 46.0 %  I-Stat arterial blood gas, ED     Status: Abnormal   Collection Time: 08/18/15 10:08 AM  Result Value Ref Range   pH, Arterial 7.447 7.350 - 7.450   pCO2 arterial 43.0 35.0 - 45.0 mmHg   pO2, Arterial 376.0 (H) 80.0 - 100.0 mmHg   Bicarbonate 29.7 (H) 20.0 - 24.0 mEq/L   TCO2 31 0 - 100 mmol/L   O2 Saturation 100.0 %   Acid-Base Excess 5.0 (H) 0.0 - 2.0 mmol/L   Patient temperature HIDE    Sample type ARTERIAL   Urinalysis, Routine w reflex microscopic (not at [facility])     Status: Abnormal   Collection Time: 08/18/15 10:44 AM  Result Value Ref Range   Color, Urine YELLOW YELLOW   APPearance CLEAR CLEAR   Specific Gravity, Urine >1.046 (H) 1.005 - 1.030   pH 7.5 5.0 - 8.0   Glucose, UA NEGATIVE NEGATIVE mg/dL   Hgb urine dipstick TRACE (A) NEGATIVE   Bilirubin Urine NEGATIVE NEGATIVE   Ketones, ur 15 (A) NEGATIVE mg/dL   Protein, ur NEGATIVE NEGATIVE mg/dL   Nitrite NEGATIVE NEGATIVE   Leukocytes, UA NEGATIVE NEGATIVE  Urine microscopic-add on     Status: Abnormal   Collection Time: 08/18/15 10:44  AM  Result Value Ref Range   Squamous Epithelial / LPF 0-5 (A) NONE SEEN   WBC, UA 0-5 0 - 5 WBC/hpf   RBC / HPF 0-5 0 - 5 RBC/hpf   Bacteria, UA RARE (A) NONE SEEN   Ct Head Wo Contrast  08/18/2015  CLINICAL DATA:  Code stroke for disorientation. EXAM: CT HEAD WITHOUT CONTRAST TECHNIQUE: Contiguous axial images were obtained from the base of the skull through the vertex without intravenous contrast. COMPARISON:  None. FINDINGS: Skull and Sinuses:No posttraumatic or aggressive finding. Clear visualized paranasal sinuses and mastoids. Visualized orbits: Negative. Brain: Heterogeneous hemorrhage in the posterior left frontal lobe with a prominent rim of vasogenic type edema. Given the patchy hemorrhage, volume estimation is confounded. Maximal axial dimension is 31 x 26 mm. There is local subarachnoid and subdural extension. The subdural hemorrhage measures up to 6 mm in thickness. Inferiorly, there is a high-density 27 mm area which is  homogeneous and less dense than the patchy hematoma, possible background mass. Midline shift at 6 mm. No ventricular entrapment. No evidence of acute infarct. No other focal finding. Patient has biventricular pacer. Head CT with and without contrast is recommended. Critical Value/emergent results were called by telephone at the time of interpretation on 08/18/2015 at 9:03 am to Dr. Silverio Decamp , who was already aware of these findings. IMPRESSION: ~ 3cm left frontal parenchymal hematoma with subarachnoid and subdural extension. There may be an underlying mass and CT with and without contrast is recommended. 6 mm midline shift. Electronically Signed   By: [place] Fantasia M.D.   On: 08/18/2015 09:09   Ct Head W Contrast  08/18/2015  CLINICAL DATA:  Sudden onset altered mental status. Aphasia. History lymphoma EXAM: CT HEAD WITH CONTRAST TECHNIQUE: Contiguous axial images were obtained from the base of the skull through the vertex with intravenous contrast. CONTRAST:  141m  ISOVUE-300 IOPAMIDOL (ISOVUE-300) INJECTION 61% COMPARISON:  CT head 08/18/2015, 1 hour prior to the enhanced study. FINDINGS: Left cerebral hemorrhage has progressed in the 1 hour between the 2 studies. There is high-density extravasation of contrast in the left frontal operculum. Majority of this hemorrhages intraparenchymal. There is progressive hemorrhage in the left parietal operculum. There is low-density edema in the left frontal parietal lobe which appears to have progressed in the interval. There is a small amount of subarachnoid hemorrhage in the left sylvian fissure. Left subdural hemorrhage has progressed since the prior study. This is felt to be due to rupture of the parenchymal hemorrhage into the subdural space. Increase in midline shift to the right now measuring 11 mm. Ventricles are not dilated. No skull lesion IMPRESSION: Large area of hemorrhage in the left hemisphere has progressed since 1 hour ago. There is parenchymal hemorrhage with active extravasation in the left frontal operculum. There is also increase in left hemispheric subdural hemorrhage. There is a small amount of subarachnoid hemorrhage in the left sylvian fissure. Increase in low-density edema on the left frontal lobe. Increase in midline shift which is now 11 mm to the right Differential diagnosis includes ruptured aneurysm including mycotic aneurysm. Ruptured vascular malformation such as AVM possible. Hypertensive hemorrhage is possible. Other possibilities would include hemorrhagic infarction. Critical Value/emergent results were called by telephone at the time of interpretation on 08/18/2015 at 10:11 am to Dr. RAudria Nine, who verbally acknowledged these results. Electronically Signed   By: CFranchot GalloM.D.   On: 08/18/2015 10:15   Ct Chest W Contrast  08/18/2015  CLINICAL DATA:  Abnormal head CT. History of chest mass and lymphoma. EXAM: CT CHEST, ABDOMEN, AND PELVIS WITH CONTRAST TECHNIQUE: Multidetector CT imaging of  the chest, abdomen and pelvis was performed following the standard protocol during bolus administration of intravenous contrast. CONTRAST:  1040mISOVUE-300 IOPAMIDOL (ISOVUE-300) INJECTION 61% COMPARISON:  04/07/2011 abdominal CT FINDINGS: CT CHEST THORACIC INLET/BODY WALL: Intubation with endotracheal tube tip above the carina. MEDIASTINUM: Biventricular pacer from the left with leads in unremarkable position. No acute vascular finding. No lymphadenopathy. Low-density mass along the left T3 intercostal nerve is stable or mildly progressed over years (2014 comparison) with 18 mm thickness and 32 mm length. 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 No aggressive features. LUNG WINDOWS: Low volume chest with perihilar streaky and reticular opacities consistent with atelectasis. No central airway obstruction. No suspicious nodules. OSSEOUS: Remote T12 body fracture with kyphosis. The fracture has been fixed by T11-L1 posterior fixation. CT ABDOMEN AND PELVIS Lower chest and  abdominal wall:  No contributory findings. Hepatobiliary: No focal liver abnormality.Cholecystectomy with negative common bile duct Pancreas: Stable appearance Spleen: Surgically absent Adrenals/Urinary Tract: Negative adrenals. Left upper pole atrophy, likely compromise of an accessory renal artery. Multiple left renal cortical cysts no hydronephrosis. Negative bladder. Reproductive:Essentially stable 55 mm left fundal fibroid. Tubal ligation clips. Stomach/Bowel:  No obstruction. No arthritic changes. Vascular/Lymphatic: No acute vascular abnormality. Presumed lymphadenectomy clips in the retroperitoneum. Stable prominent lymph nodes along the left gastric, benign given long interval. Peritoneal: No ascites or pneumoperitoneum. Musculoskeletal: No acute or aggressive process. IMPRESSION: 1. No evidence of primary malignancy or metastatic disease in the chest or abdomen. 2. 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 3. Low volume chest with bilateral atelectasis. 4. Chronic findings are stable and described above. Electronically Signed   By: [place] Fantasia M.D.   On: 08/18/2015 10:13   Ct Abdomen Pelvis W Contrast  08/18/2015  CLINICAL DATA:  Abnormal head CT. History of chest mass and lymphoma. EXAM: CT CHEST, ABDOMEN, AND PELVIS WITH CONTRAST TECHNIQUE: Multidetector CT imaging of the chest, abdomen and pelvis was performed following the standard protocol during bolus administration of intravenous contrast. CONTRAST:  130m ISOVUE-300 IOPAMIDOL (ISOVUE-300) INJECTION 61% COMPARISON:  04/07/2011 abdominal CT FINDINGS: CT CHEST THORACIC INLET/BODY WALL: Intubation with endotracheal tube tip above the carina. MEDIASTINUM: Biventricular pacer from the left with leads in unremarkable position. No acute vascular finding. No lymphadenopathy. Low-density mass along the left T3 intercostal nerve is stable or mildly progressed over years (2014 comparison) with 18 mm thickness and 32 mm length. 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 No aggressive features. LUNG WINDOWS: Low volume chest with perihilar streaky and reticular opacities consistent with atelectasis. No central airway obstruction. No suspicious nodules. OSSEOUS: Remote T12 body fracture with kyphosis. The fracture has been fixed by T11-L1 posterior fixation. CT ABDOMEN AND PELVIS Lower chest and abdominal wall:  No contributory findings. Hepatobiliary: No focal liver abnormality.Cholecystectomy with negative common bile duct Pancreas: Stable appearance Spleen: Surgically absent Adrenals/Urinary Tract: Negative adrenals. Left upper pole atrophy, likely compromise of an accessory renal artery. Multiple left renal cortical cysts no hydronephrosis. Negative bladder. Reproductive:Essentially stable 55 mm left fundal fibroid. Tubal ligation clips. Stomach/Bowel:  No obstruction. No arthritic changes. Vascular/Lymphatic: No acute vascular abnormality. Presumed  lymphadenectomy clips in the retroperitoneum. Stable prominent lymph nodes along the left gastric, benign given long interval. Peritoneal: No ascites or pneumoperitoneum. Musculoskeletal: No acute or aggressive process. IMPRESSION: 1. No evidence of primary malignancy or metastatic disease in the chest or abdomen. 2. 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 3. Low volume chest with bilateral atelectasis. 4. Chronic findings are stable and described above. Electronically Signed   By: JMonte FantasiaM.D.   On: 08/18/2015 10:13   Dg Chest Portable 1 View  08/18/2015  CLINICAL DATA:  Endotracheal and nasogastric tube placements, intubation, chronic systolic heart failure, non ischemic cardiomyopathy, former smoker, personal history non-Hodgkin's lymphoma EXAM: PORTABLE CHEST 1 VIEW COMPARISON:  Portable exam 0921 hours compared to 05/22/2015 FINDINGS: Tip of endotracheal tube projects 3.4 cm above carina. Nasogastric tube extends into stomach. LEFT subclavian pacemaker/AICD leads project over RIGHT atrium, RIGHT ventricle and coronary sinus. Normal heart size, mediastinal contours and pulmonary vascularity. Atherosclerotic calcification aorta. Bibasilar atelectasis. No definite infiltrate, pleural effusion or pneumothorax. IMPRESSION: Bibasilar atelectasis. Tube positions as above. Electronically Signed   By: MLavonia DanaM.D.   On:  08/18/2015 09:51    Review of Systems  Unable to perform ROS: intubated    Blood pressure 125/70, pulse 68, temperature 96.2 F (35.7 C), temperature source Rectal, resp. rate 18, height 5' 8"  (1.727 m), SpO2 100 %. Physical Exam intubated.  No evidence of trauma. Neck, no stiffness. Cv, nl. Lungs, clear. Abdomen soft. Extremoties, nl. Neuro pupils 4 in the left and 3 in the rifht. reactives to light. The rest of  the examen can not be done secondary to sedation.  Assessment/Plan Review the case with dr Kathyrn Sheriff and dr Carlis Abbott. We  are going to do a left FTP craniotomy with vascular clips stand by. Spoke in 3 ocasions with the family. They are aware of risks such as paralysis, speech damage, bleeding to death. Dr Kathyrn Sheriff is standing by in the angiogram suit to help  Floyce Stakes, MD 08/18/2015, 11:32 AM

## 2015-08-18 NOTE — ED Notes (Signed)
Pt. In CT scan when she started to vomit. Pt. Assisted to sit up. Pt. Laid back down a few minutes later to resume the scan. Pt. Then started rhythmic jerking of eye and head. IV started by RN. Ativan and zofran given. Pt. Lethargic after ~1 min of seizure-like activity. Pt. Diaphoretic and GCS of 11 (see charting) at this time. EDP and neuro at bedside.

## 2015-08-19 ENCOUNTER — Inpatient Hospital Stay (HOSPITAL_COMMUNITY): Payer: BC Managed Care – PPO

## 2015-08-19 ENCOUNTER — Encounter (HOSPITAL_COMMUNITY): Payer: Self-pay | Admitting: Radiology

## 2015-08-19 DIAGNOSIS — J9601 Acute respiratory failure with hypoxia: Secondary | ICD-10-CM | POA: Diagnosis present

## 2015-08-19 DIAGNOSIS — G935 Compression of brain: Secondary | ICD-10-CM

## 2015-08-19 DIAGNOSIS — J9600 Acute respiratory failure, unspecified whether with hypoxia or hypercapnia: Secondary | ICD-10-CM

## 2015-08-19 DIAGNOSIS — G936 Cerebral edema: Secondary | ICD-10-CM

## 2015-08-19 LAB — BASIC METABOLIC PANEL
ANION GAP: 10 (ref 5–15)
BUN: 15 mg/dL (ref 6–20)
CALCIUM: 9.1 mg/dL (ref 8.9–10.3)
CO2: 22 mmol/L (ref 22–32)
Chloride: 106 mmol/L (ref 101–111)
Creatinine, Ser: 0.8 mg/dL (ref 0.44–1.00)
Glucose, Bld: 144 mg/dL — ABNORMAL HIGH (ref 65–99)
POTASSIUM: 3.5 mmol/L (ref 3.5–5.1)
SODIUM: 138 mmol/L (ref 135–145)

## 2015-08-19 LAB — GLUCOSE, CAPILLARY
GLUCOSE-CAPILLARY: 105 mg/dL — AB (ref 65–99)
GLUCOSE-CAPILLARY: 115 mg/dL — AB (ref 65–99)
GLUCOSE-CAPILLARY: 111 mg/dL — AB (ref 65–99)
Glucose-Capillary: 114 mg/dL — ABNORMAL HIGH (ref 65–99)
Glucose-Capillary: 129 mg/dL — ABNORMAL HIGH (ref 65–99)
Glucose-Capillary: 83 mg/dL (ref 65–99)

## 2015-08-19 LAB — BLOOD GAS, ARTERIAL
ACID-BASE DEFICIT: 0.1 mmol/L (ref 0.0–2.0)
BICARBONATE: 23 meq/L (ref 20.0–24.0)
Drawn by: 362771
FIO2: 0.3
LHR: 18 {breaths}/min
MECHVT: 500 mL
O2 Saturation: 95.8 %
PEEP/CPAP: 5 cmH2O
Patient temperature: 98.6
TCO2: 23.9 mmol/L (ref 0–100)
pCO2 arterial: 30.6 mmHg — ABNORMAL LOW (ref 35.0–45.0)
pH, Arterial: 7.489 — ABNORMAL HIGH (ref 7.350–7.450)
pO2, Arterial: 101 mmHg — ABNORMAL HIGH (ref 80.0–100.0)

## 2015-08-19 LAB — CBC
HCT: 35.6 % — ABNORMAL LOW (ref 36.0–46.0)
Hemoglobin: 12.2 g/dL (ref 12.0–15.0)
MCH: 33.5 pg (ref 26.0–34.0)
MCHC: 34.3 g/dL (ref 30.0–36.0)
MCV: 97.8 fL (ref 78.0–100.0)
PLATELETS: 352 10*3/uL (ref 150–400)
RBC: 3.64 MIL/uL — AB (ref 3.87–5.11)
RDW: 13.1 % (ref 11.5–15.5)
WBC: 23.6 10*3/uL — AB (ref 4.0–10.5)

## 2015-08-19 MED ORDER — ADULT MULTIVITAMIN W/MINERALS CH
1.0000 | ORAL_TABLET | Freq: Every day | ORAL | Status: DC
  Administered 2015-08-20 – 2015-08-30 (×10): 1
  Filled 2015-08-19 (×10): qty 1

## 2015-08-19 MED ORDER — PRO-STAT SUGAR FREE PO LIQD
60.0000 mL | Freq: Four times a day (QID) | ORAL | Status: DC
  Administered 2015-08-19 – 2015-08-20 (×6): 60 mL
  Filled 2015-08-19 (×6): qty 60

## 2015-08-19 MED ORDER — VITAL HIGH PROTEIN PO LIQD
1000.0000 mL | ORAL | Status: DC
  Administered 2015-08-19: 1000 mL
  Administered 2015-08-20 (×2)
  Administered 2015-08-20: 1000 mL
  Administered 2015-08-21: 02:00:00

## 2015-08-19 MED ORDER — POTASSIUM CHLORIDE 10 MEQ/50ML IV SOLN
10.0000 meq | INTRAVENOUS | Status: AC
  Administered 2015-08-19 (×2): 10 meq via INTRAVENOUS
  Filled 2015-08-19 (×2): qty 50

## 2015-08-19 NOTE — Progress Notes (Signed)
SLP Cancellation Note  Patient Details Name: Sylvia Moore MRN: [id number] DOB: 09-Dec-1950   Cancelled treatment:        Pt currently on vent and therefore unable to assess speech, language and cognition at this time. Will continue to follow.   Titus Mould 08/19/2015, 10:46 AM

## 2015-08-19 NOTE — Progress Notes (Signed)
[facility] ADULT ICU REPLACEMENT PROTOCOL FOR AM LAB REPLACEMENT ONLY  The patient does apply for the [facility] [place] Adult ICU Electrolyte Replacment Protocol based on the criteria listed below:   1. Is GFR >/= 40 ml/min? Yes.    Patient's GFR today is >60 2. Is urine output >/= 0.5 ml/kg/hr for the last 6 hours? Yes.   Patient's UOP is 0.5 ml/kg/hr 3. Is BUN < 60 mg/dL? Yes.    Patient's BUN today is 15 4. Abnormal electrolyte(s)K3.5 5. Ordered repletion with: per protocol 6. If a panic level lab has been reported, has the CCM MD in charge been notified? Yes.  .   Physician:  Dr Mortimer Fries MD  Vear Clock 08/19/2015 6:10 AM

## 2015-08-19 NOTE — Progress Notes (Signed)
STROKE TEAM PROGRESS NOTE   HISTORY OF PRESENT ILLNESS Sylvia Moore is an 65 y.o. female patient whowas brought into the emergency room with altered mental status, acute onset of slurred speech around 7:30 AM 08/18/2015. EMS was called and was brought into the emergency room for evaluation. While she was on the CT scan table, she started having vomiting, followed by a brief convulsive seizure involving her left upper and lower extremities more than the right side with left gaze deviation noted. The convulsive seizure lasted about a minute spontaneously resolved. She was given Ativan 1 mg at that time.  Later, her wife reported that patient has been not feeling well for the past couple of days reported headaches had reduced level of alertness and appear to be dazed. But she is not sure about any new symptoms such as vision or speech problems or any focal motor weakness. Patient unable to provide history due to altered mental status. Information obtained from patient's wife.   Due to worsening of her mental status and difficulty with protecting her airway, she was intubated in the emergency room.  CT of the head showed a large hemorrhagic lesion with small left convexity subdural hematoma. Neurosurgery was consulted for further evaluation.  Due to significant vasogenic edema surrounding this hemorrhagic lesion with mass effect and midline shift which appear to be rather unusual for an acute hemorrhage, a follow-up CT of the head with contrast as well as chest and abdomen CTs with contrast or performed. The follow-up CT showed increase in size of the hemorrhage, and also worsening subdural hemorrhage. Then a follow-up CTA head and neck were performed which did not show any definite evidence of vascular abnormality contributing to this hemorrhage.  Patient was not administered IV t-PA secondary to [place]. She was admitted to the neuro ICU for further evaluation and treatment.   SUBJECTIVE  (INTERVAL HISTORY) Her wife is at the bedside.  Patient intubated. Surgeon to assess IVC, minimal drainage per RN. CT scan from this morning  Shows improved but residual 3 mm left to right midline shift  With trace tentorial subdural hematoma and moderate left residual subarachnoid hemorrhage with hematoma evacuation. Right frontal ventriculostomy with catheter tip in the left basal ganglia.No further seizures on Keppra   OBJECTIVE Temp:  [95.9 F (35.5 C)-99.9 F (37.7 C)] 97.6 F (36.4 C) (04/19 0739) Pulse Rate:  [68-104] 88 (04/19 0700) Cardiac Rhythm:  [-] Normal sinus rhythm (04/19 0000) Resp:  [16-27] 18 (04/19 0700) BP: (99-165)/(41-124) 103/49 mmHg (04/19 0700) SpO2:  [97 %-100 %] 100 % (04/19 0700) Arterial Line BP: (110-151)/(35-59) 141/59 mmHg (04/19 0700) FiO2 (%):  [30 %-100 %] 30 % (04/19 0322) Weight:  [91.7 kg (202 lb 2.6 oz)] 91.7 kg (202 lb 2.6 oz) (04/18 1600)  CBC:  Recent Labs Lab 08/18/15 0832  08/18/15 1253 08/19/15 0430  WBC 8.4  --   --  23.6*  NEUTROABS 4.1  --   --   --   HGB 13.5  < > 14.3 12.2  HCT 40.3  < > 42.0 35.6*  MCV 98.8  --   --  97.8  PLT 388  --   --  352  < > = values in this interval not displayed.  Basic Metabolic Panel:  Recent Labs Lab 08/18/15 0832 08/18/15 0840 08/18/15 1253 08/19/15 0430  NA 137 140 138 138  K 4.0 4.0 4.0 3.5  CL 104 103  --  106  CO2 24  --   --  22  GLUCOSE 132* 130*  --  144*  BUN 18 22*  --  15  CREATININE 0.89 0.80  --  0.80  CALCIUM 10.0  --   --  9.1    Lipid Panel:    Component Value Date/Time   CHOL 164 06/19/2014 0057   TRIG 108 08/18/2015 1139   HDL 42 06/19/2014 0057   CHOLHDL 3.9 06/19/2014 0057   VLDL 19 06/19/2014 0057   LDLCALC 103* 06/19/2014 0057   HgbA1c:  Lab Results  Component Value Date   HGBA1C 5.6 06/18/2014   Urine Drug Screen:    Component Value Date/Time   LABOPIA NONE DETECTED 08/18/2015 1044   COCAINSCRNUR NONE DETECTED 08/18/2015 1044   LABBENZ NONE  DETECTED 08/18/2015 1044   AMPHETMU NONE DETECTED 08/18/2015 1044   THCU NONE DETECTED 08/18/2015 1044   LABBARB NONE DETECTED 08/18/2015 1044      IMAGING  Ct Head Wo Contrast 08/19/2015  Interval LEFT frontotemporal craniotomy for evacuation of temporal lobe hematoma and subdural hematoma. Moderate residual LEFT subarachnoid hemorrhage. Trace LEFT tentorial subdural hematoma. Improved, residual 3 mm LEFT to RIGHT midline shift.  08/18/2015   ~ 3cm left frontal parenchymal hematoma with subarachnoid and subdural extension. There may be an underlying mass and CT with and without contrast is recommended. 6 mm midline shift.  08/18/2015  Large area of hemorrhage in the left hemisphere has progressed since 1 hour ago. There is parenchymal hemorrhage with active extravasation in the left frontal operculum. There is also increase in left hemispheric subdural hemorrhage. There is a small amount of subarachnoid hemorrhage in the left sylvian fissure. Increase in low-density edema on the left frontal lobe. Increase in midline shift which is now 11 mm to the right   Ct Angio Head & Neck W/cm &/or Wo/cm 08/18/2015   Negative for vascular malformation. No aneurysm or AVM is seen to account for the large left frontal temporal hematoma. Large hematoma left frontal temporal lobe is stable since the recent CT scan. Extravasated contrast shows improvement since the prior study. Mild amount of subarachnoid hemorrhage. 6 mm left subdural hematoma is stable. There is increased edema in the left hemisphere with increased mass-effect on the lateral ventricle. 11 mm midline shift is unchanged.   Ct Chest Abdomen Pelvis W Contrast 08/18/2015   1. No evidence of primary malignancy or metastatic disease in the chest or abdomen. 2. 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 3. Low volume chest with bilateral atelectasis. 4. Chronic findings are stable   Dg Chest [facility] 08/19/2015   Increased left basilar subsegmental atelectasis. Stable support apparatus.  08/18/2015  1. New central line in unremarkable position.  No pneumothorax. 2. Low volume chest with basilar atelectasis.  08/18/2015   Bibasilar atelectasis. Tube positions as above.    PHYSICAL EXAM Middle-aged Caucasian lady who is sedated on propofol drip and intubated. She has postcraniotomy head bandage. . Afebrile. Head is nontraumatic. Neck is supple without bruit.    Cardiac exam no murmur or gallop. Lungs are clear to auscultation. Distal pulses are well felt. Neurological Exam :  Patient is sedated on propofol drip. She does not follow any commands.Eyes are closed. Pupils are both 2 mm reactive. Corneal reflexes are present. Fundi cannot be visualized. Eyes are in primary physician. Dolls eye moments are sluggish. She does not blink to threat on either side. She does not grimace to pain. No spontaneous extremity movements.Moves left upper  and lower extremity to sternal rub. Does not move the right side to painful stimuli. Purposeful withdrawal on the left leg and arm pain. Minimum withdrawal in the right lower extremity to pain. Now withdrawal in the right upper extremity with hypotonia. Right plantar upgoing left downgoing. ASSESSMENT/PLAN Sylvia Moore is a 65 y.o. female with history of CHF, schwannoma of spinal cord, orthostatic HTN, GERD, L BBB, Hepatitis and Hodgkin's lymphoma presenting with altered mental status and dysarthria.   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 IVC placed during OR. Suspect hemorrhage secondary to Suspected underlying tumor Pathology pending.   Neurosurgery consulted Joya Salm) 4/19 crani with IVC placed, some "abnormal tissue" removed, ? Tumor. Path pending  Sedated on propofol  NS to assess IVC   Resultant Right hemiplegia    CT abdomen/chest/pelvis no evidence of metastatic disease. 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 L frontal lobe ICH w/ SAH, L SDH. Mass effect with 52mm midline shift  Repeat CT head in am   EEG pending   SCDs for VTE prophylaxis  Diet NPO time specified  No antithrombotic prior to admission  Ongoing aggressive stroke risk factor management  Therapy recommendations:  pending   Disposition:  pending (has a 57 year-old daughter with mental retardation, unable to make decisions)  If source of hemorrhage is tumor, type of tumor will dictate prognosis.  Patient's neurologic condition post extubation will also help guide plans  Respiratory failure  Intubated in the ED following seizure from [place]  Seizure  Left greater than right body involvement  On Keppra 1.5 q 12h  If EEG neg for seizure, will decrease dose  EEG pending  Hypertension  Low 90-100s  Hyperlipidemia  Home meds:  No statin  Mild hyperglycemia  110-120s  Other Stroke Risk Factors  Former Cigarette smoker, quit smoking 47 years ago   ETOH use  Marijuana use  Obesity, Body mass index is 30.75 kg/(m^2).   Family hx stroke (father)  NICM with EF 123456  Chronic systolic CHF   Other Active Problems  History of Hodgkin's lymphoma  History of hepatitis, type unknown  History of spinal cord schwannoma  Hypothyroidism  Leukocytosis 8.4->23.6  Hospital day # 1  BIBY,SHARON  [place] for Pager information 08/19/2015 9:10 AM  I have personally examined this patient, reviewed notes, independently viewed imaging studies, participated in medical decision making and plan of care. I have made any additions or clarifications directly to the above note. Agree with note above.  She presented with altered mental status, slurred speech and a seizure due to left temporal Lobar hematoma as well as small subdural  hematoma etiology indeterminate but possibly related to underlying brain tumor .She had emergent craniotomy for hematoma removal.She remains at risk for neurological worsening, hematoma expansion, cerebral edema, brain herniation, recurrent seizures and needs ongoing ventilatory support, hemodynamic monitoring, and seizure management.  I had a long 30 minute discussion with the patient's wife and the bedside About her prognosis, plan of care  and answered questions. I also discussed the case with Dr. Joya Salm and Dr. Chase Caller. Plan is to continue ventilatory support till tomorrow and consider extubation. Ventriculostomy catheter management as per Dr. Joya Salm. Continue Keppra for seizures.and strict blood pressure control.Check EEG for silent seizures This patient is critically  ill and at significant risk of neurological worsening, death and care requires constant monitoring of vital signs, hemodynamics,respiratory and cardiac monitoring, extensive review of multiple databases, frequent neurological assessment, discussion with family, other specialists and medical decision making of high complexity.I have made any additions or clarifications directly to the above note.This critical care time does not reflect procedure time, or teaching time or supervisory time of PA/NP/Med Resident etc but could involve care discussion time.  I spent 50 minutes of neurocritical care time  in the care of  this patient.     Antony Contras, MD Medical Director [facility] Pager: [phone] 08/19/2015 2:17 PM    To contact Stroke Continuity provider, please refer to [email or web address] After hours, contact General Neurology

## 2015-08-19 NOTE — Progress Notes (Signed)
PT Cancellation Note  Patient Details Name: Sylvia Moore MRN: [id number] DOB: January 22, 1951   Cancelled Treatment:    Reason Eval/Treat Not Completed: Medical issues which prohibited therapy.  Pt may be extubated in AM will await decision on extubation and check with team in AM to proceed with PT evaluation.   Thanks,      Barbarann Ehlers. [place], [place], DPT [phone]   08/19/2015, 4:46 PM

## 2015-08-19 NOTE — Progress Notes (Signed)
Pt transported to CT and back to room on vent at 100% fio2.  Pt tolerated trip well without incident.  Pt placed back on previous fio2 setting of 30%.

## 2015-08-19 NOTE — H&P (Signed)
PULMONARY / CRITICAL CARE MEDICINE   Name: Sylvia Moore MRN: [id number] DOB: [birth year]    ADMISSION DATE:  08/18/2015 CONSULTATION DATE:  4/18  REFERRING MD:  Regenia Skeeter  CHIEF COMPLAINT:  ICH    BRIEF 79 yof c/o headache the am of 4/18-->followed by aphasia and then somnolence. Had witnessed seizure activity by staff while in CT scan. CT showed large left ICH w/ midline shift. Admitted to [facility] service, w/ plan for probable left Crani pending results of CT angio. She  has a past medical history of Chronic systolic heart failure ([place]); Depression; Gout; HOH (hard of hearing); Schwannoma of spinal cord ([place]); Orthostatic hypotension; NICM (nonischemic cardiomyopathy) (HCC); GERD (gastroesophageal reflux disease); LBBB (left bundle branch block); Pneumonia (1960's X 1; 2014); Hypothyroidism; History of blood transfusion (~ 1976); Hepatitis (~ 1978); and Hodgkin's lymphoma ([place]) H.as past surgical history that includes Hysteroscopy w/D&C; Colonoscopy w/ polypectomy; Lumbar fusion (07/2011); Dilation and curettage of uterus; Abdominal exploration surgery (1975); Knee arthroscopy (Left); Colposcopy (N/A, 08/14/2012); Lesion removal (N/A, 08/14/2012); Rectal biopsy (N/A, 08/14/2012); Esophagogastroduodenoscopy (egd) with propofol (N/A, 01/20/2014); Colonoscopy with propofol (N/A, 01/20/2014); Knee arthroscopy (Right, 2015); left heart catheterization with coronary angiogram (N/A, 07/01/2014); Back surgery; Laparoscopic cholecystectomy (~ 2005); Neck lesion biopsy (Right, 1975); Splenectomy (1970's); and bi-ventricular implantable cardioverter defibrillator (N/A, 07/08/2014).   STUDIES and EVENTS 4/18  - 08/18/2015 - admit and intubated 4/18:~ 3cm left frontal parenchymal hematoma with subarachnoid and subdural extension. 4/18 CT chest/abd/pelvis: 1. No evidence of primary malignancy or metastatic disease in the chest or abdomen. 2. 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 3. Low volume chest with bilateral atelectasis. 4/18 Repeat CT head: Large area of hemorrhage in the left hemisphere has progressed since 1 hour ago. There is parenchymal hemorrhage with active extravasation in the left frontal operculum. There is also increase in left hemispheric subdural hemorrhage. There is a small amount of subarachnoid hemorrhage in the left sylvian fissure. Increase in low-density edema on the left frontal lobe. Increase in midline shift which is now 11 mm to the right 4/18 CT angio of brain >> EEG 4/18>>>  4./18 - Dr Joya Salm "Left frontotemporal craniotomy, evacuation of the subdural hematoma, evacuation of intercerebral hematoma plus resection of abnormal tissue most likely tumor."   SUBJECTIVE/OVERNIGHT/INTERVAL HX 08/19/15 - Deeply sedated with diprivan gtt 37mcg. Per RN - neurosurgery does not want WUA . Wife by her side and pastor - both appreciative. PEr RN only flicker movement on right side   VITAL SIGNS: BP 97/50 mmHg  Pulse 90  Temp(Src) 98.8 F (37.1 C) (Axillary)  Resp 18  Ht 5\' 8"  (1.727 m)  Wt 91.7 kg (202 lb 2.6 oz)  BMI 30.75 kg/m2  SpO2 100%  HEMODYNAMICS:    VENTILATOR SETTINGS: Vent Mode:  [-] PRVC FiO2 (%):  [30 %-100 %] 30 % Set Rate:  [18 bmp] 18 bmp Vt Set:  [500 mL] 500 mL PEEP:  [5 cmH20] 5 cmH20 Plateau Pressure:  [17 cmH20-19 cmH20] 18 cmH20  INTAKE / OUTPUT: I/O last 3 completed shifts: In: [id number] [I.V.:2950; Other:200; IV Piggyback:280] Out: 1299 [Urine:835; Drains:14; Blood:450]  PHYSICAL EXAMINATION: General:  Sedated 65 year old white female, currently on full vent support Neuro:  RASS -3 on diprivan. Left side with mittens on hand HEENT:  PRRL, orally intubated. No JVD Cardiovascular:  rrr w/out MRG Lungs:  Exp wheeze, no accessory use.  Abdomen:  Soft, + bowel sounds No OM Musculoskeletal:  Equal st  and bulk Skin:  Warm and dry   LABS:  PULMONARY  Recent Labs Lab 08/18/15 0840 08/18/15 1008  08/18/15 1253 08/19/15 0355  PHART  --  7.447 7.427 7.489*  PCO2ART  --  43.0 39.4 30.6*  PO2ART  --  376.0* 290.0* 101*  HCO3  --  29.7* 26.3* 23.0  TCO2 25 31 28  23.9  O2SAT  --  100.0 100.0 95.8    CBC  Recent Labs Lab 08/18/15 0832 08/18/15 0840 08/18/15 1253 08/19/15 0430  HGB 13.5 14.6 14.3 12.2  HCT 40.3 43.0 42.0 35.6*  WBC 8.4  --   --  23.6*  PLT 388  --   --  352    COAGULATION  Recent Labs Lab 08/18/15 0832  INR 1.12    CARDIAC  No results for input(s): TROPONINI in the last 168 hours. No results for input(s): PROBNP in the last 168 hours.   CHEMISTRY  Recent Labs Lab 08/18/15 0832 08/18/15 0840 08/18/15 1253 08/19/15 0430  NA 137 140 138 138  K 4.0 4.0 4.0 3.5  CL 104 103  --  106  CO2 24  --   --  22  GLUCOSE 132* 130*  --  144*  BUN 18 22*  --  15  CREATININE 0.89 0.80  --  0.80  CALCIUM 10.0  --   --  9.1   Estimated Creatinine Clearance: 84.1 mL/min (by C-G formula based on Cr of 0.8).   LIVER  Recent Labs Lab 08/18/15 0832  AST 36  ALT 26  ALKPHOS 85  BILITOT 0.7  PROT 8.4*  ALBUMIN 3.8  INR 1.12     INFECTIOUS No results for input(s): LATICACIDVEN, PROCALCITON in the last 168 hours.   ENDOCRINE CBG (last 3)   Recent Labs  08/18/15 2309 08/19/15 0312 08/19/15 0734  GLUCAP 132* 114* 129*         IMAGING x48h  - image(s) personally visualized  -   highlighted in bold Ct Angio Head W/cm &/or Wo Cm  08/18/2015  CLINICAL DATA:  Intracranial hemorrhage. Rule out vascular malformation. EXAM: CT ANGIOGRAPHY HEAD AND NECK TECHNIQUE: Multidetector CT imaging of the head and neck was performed using the standard protocol during bolus administration of intravenous contrast. Multiplanar CT image reconstructions and MIPs were obtained to evaluate the vascular anatomy. Carotid stenosis measurements (when applicable) are obtained utilizing NASCET criteria, using the distal internal carotid diameter as the denominator.  CONTRAST:  50 mL Isovue 370 IV COMPARISON:  CT head earlier today FINDINGS: CTA NECK Aortic arch: Mild atherosclerotic disease in the aortic arch. No aneurysm or dissection. Proximal great vessels patent. Left subclavian transvenous pacemaker leads noted. Soft tissue mass along the course of the left fourth intercostal nerve extending to the foramen. 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 See separate CT chest report from today. Right carotid system: Right common carotid artery widely patent. Right carotid bifurcation has minimal atherosclerotic disease without stenosis. Left carotid system: Left common carotid artery widely patent. Minimal atherosclerotic disease in the carotid bulb without significant carotid stenosis. Vertebral arteries:Both vertebral arteries patent to the basilar without stenosis. Skeleton: Mild degenerative change at C5-6. No fracture and no acute bony abnormality. Other neck: The patient is intubated with NG tube in place. No cervical adenopathy. CTA HEAD Anterior circulation: Large hematoma (4.0 cm x 6.0 cm) in the left frontotemporal lobe similar to the recent contrast-enhanced CT performed approximate 1 hour prior. High-density contrast is seen in the hematoma compatible with  active extravasation. This has improved since the earlier contrast-enhanced CT head. Mild amount of subarachnoid hemorrhage in the left sylvian fissure. Left hemispheric subdural hematoma measures up to 6 mm in diameter. There is low-density edema surrounding the hematoma. Progressive mass-effect on the left ventricle and hemisphere. 11 mm midline shift is approximately the same as the recent study. No evidence of vascular malformation. No aneurysm or AVM. Cavernous carotid widely patent. Anterior and middle cerebral arteries patent bilaterally without stenosis Posterior circulation: Both vertebral arteries patent to the basilar. PICA patent bilaterally. Basilar widely patent. Superior cerebellar and posterior  cerebral arteries patent bilaterally without significant stenosis. Venous sinuses: Patent Anatomic variants: None Delayed phase: Not performed IMPRESSION: Negative for vascular malformation. No aneurysm or AVM is seen to account for the large left frontal temporal hematoma. Large hematoma left frontal temporal lobe is stable since the recent CT scan. Extravasated contrast shows improvement since the prior study. Mild amount of subarachnoid hemorrhage. 6 mm left subdural hematoma is stable. There is increased edema in the left hemisphere with increased mass-effect on the lateral ventricle. 11 mm midline shift is unchanged. I reviewed the findings with Dr.  Joya Salm at 1120 hours on 08/18/2015 Electronically Signed   By: Franchot Gallo M.D.   On: 08/18/2015 11:37   Ct Head Wo Contrast  08/19/2015  CLINICAL DATA:  Follow-up evaluation. History of non-Hodgkin's lymphoma. EXAM: CT HEAD WITHOUT CONTRAST TECHNIQUE: Contiguous axial images were obtained from the base of the skull through the vertex without intravenous contrast. COMPARISON:  CT head August 18, 2015 FINDINGS: Interval evacuation of LEFT temporal lobe intraparenchymal hematoma with small amount of residual blood products and vasogenic edema. Small amount of LEFT extra-axial pneumocephalus. Moderate amount of subarachnoid hemorrhage in LEFT sylvian fissure extending to LEFT frontoparietal sulci. Evacuation of LEFT subdural hematoma. Interval placement of front ventriculostomy catheter via new high RIGHT frontal burr hole with distal tip traversing the frontal horn of LEFT lateral ventricle, distal tip in LEFT basal ganglia. 3 mm LEFT to RIGHT midline shift, decreased from 6 mm. Decreased mass effect on LEFT lateral ventricle without entrapment or hydrocephalus. Trace subdural hematoma along LEFT cerebellar tentorium. Basal cisterns are patent. Interval LEFT frontal temporal craniotomy. LEFT scalp soft tissue swelling, subcutaneous gas and skin staples. Small  amount RIGHT frontal scalp soft tissue swelling and gas. Imaged paranasal sinuses are well-aerated. New LEFT middle ear and mastoid effusion. Craniotomy extends through the anterior aspect of the mastoid segment LEFT temporal bone. Ocular globes and orbital contents are normal. IMPRESSION: Interval LEFT frontotemporal craniotomy for evacuation of temporal lobe hematoma and subdural hematoma. Moderate residual LEFT subarachnoid hemorrhage. Trace LEFT tentorial subdural hematoma. Improved, residual 3 mm LEFT to RIGHT midline shift. Electronically Signed   By: Elon Alas M.D.   On: 08/19/2015 05:28   Ct Head Wo Contrast  08/18/2015  CLINICAL DATA:  Code stroke for disorientation. EXAM: CT HEAD WITHOUT CONTRAST TECHNIQUE: Contiguous axial images were obtained from the base of the skull through the vertex without intravenous contrast. COMPARISON:  None. FINDINGS: Skull and Sinuses:No posttraumatic or aggressive finding. Clear visualized paranasal sinuses and mastoids. Visualized orbits: Negative. Brain: Heterogeneous hemorrhage in the posterior left frontal lobe with a prominent rim of vasogenic type edema. Given the patchy hemorrhage, volume estimation is confounded. Maximal axial dimension is 31 x 26 mm. There is local subarachnoid and subdural extension. The subdural hemorrhage measures up to 6 mm in thickness. Inferiorly, there is a high-density 27 mm area which is homogeneous and  less dense than the patchy hematoma, possible background mass. Midline shift at 6 mm. No ventricular entrapment. No evidence of acute infarct. No other focal finding. Patient has biventricular pacer. Head CT with and without contrast is recommended. Critical Value/emergent results were called by telephone at the time of interpretation on 08/18/2015 at 9:03 am to Dr. Silverio Decamp , who was already aware of these findings. IMPRESSION: ~ 3cm left frontal parenchymal hematoma with subarachnoid and subdural extension. There may be an  underlying mass and CT with and without contrast is recommended. 6 mm midline shift. Electronically Signed   By: Monte Fantasia M.D.   On: 08/18/2015 09:09   Ct Head W Contrast  08/18/2015  CLINICAL DATA:  Sudden onset altered mental status. Aphasia. History lymphoma EXAM: CT HEAD WITH CONTRAST TECHNIQUE: Contiguous axial images were obtained from the base of the skull through the vertex with intravenous contrast. CONTRAST:  122mL ISOVUE-300 IOPAMIDOL (ISOVUE-300) INJECTION 61% COMPARISON:  CT head 08/18/2015, 1 hour prior to the enhanced study. FINDINGS: Left cerebral hemorrhage has progressed in the 1 hour between the 2 studies. There is high-density extravasation of contrast in the left frontal operculum. Majority of this hemorrhages intraparenchymal. There is progressive hemorrhage in the left parietal operculum. There is low-density edema in the left frontal parietal lobe which appears to have progressed in the interval. There is a small amount of subarachnoid hemorrhage in the left sylvian fissure. Left subdural hemorrhage has progressed since the prior study. This is felt to be due to rupture of the parenchymal hemorrhage into the subdural space. Increase in midline shift to the right now measuring 11 mm. Ventricles are not dilated. No skull lesion IMPRESSION: Large area of hemorrhage in the left hemisphere has progressed since 1 hour ago. There is parenchymal hemorrhage with active extravasation in the left frontal operculum. There is also increase in left hemispheric subdural hemorrhage. There is a small amount of subarachnoid hemorrhage in the left sylvian fissure. Increase in low-density edema on the left frontal lobe. Increase in midline shift which is now 11 mm to the right Differential diagnosis includes ruptured aneurysm including mycotic aneurysm. Ruptured vascular malformation such as AVM possible. Hypertensive hemorrhage is possible. Other possibilities would include hemorrhagic infarction.  Critical Value/emergent results were called by telephone at the time of interpretation on 08/18/2015 at 10:11 am to Dr. Audria Nine , who verbally acknowledged these results. Electronically Signed   By: Franchot Gallo M.D.   On: 08/18/2015 10:15   Ct Angio Neck W/cm &/or Wo/cm  08/18/2015  CLINICAL DATA:  Intracranial hemorrhage. Rule out vascular malformation. EXAM: CT ANGIOGRAPHY HEAD AND NECK TECHNIQUE: Multidetector CT imaging of the head and neck was performed using the standard protocol during bolus administration of intravenous contrast. Multiplanar CT image reconstructions and MIPs were obtained to evaluate the vascular anatomy. Carotid stenosis measurements (when applicable) are obtained utilizing NASCET criteria, using the distal internal carotid diameter as the denominator. CONTRAST:  50 mL Isovue 370 IV COMPARISON:  CT head earlier today FINDINGS: CTA NECK Aortic arch: Mild atherosclerotic disease in the aortic arch. No aneurysm or dissection. Proximal great vessels patent. Left subclavian transvenous pacemaker leads noted. Soft tissue mass along the course of the left fourth intercostal nerve extending to the foramen. 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 See separate CT chest report from today. Right carotid system: Right common carotid artery widely patent. Right carotid bifurcation has minimal atherosclerotic disease without stenosis. Left carotid system: Left common carotid artery widely patent. Minimal  atherosclerotic disease in the carotid bulb without significant carotid stenosis. Vertebral arteries:Both vertebral arteries patent to the basilar without stenosis. Skeleton: Mild degenerative change at C5-6. No fracture and no acute bony abnormality. Other neck: The patient is intubated with NG tube in place. No cervical adenopathy. CTA HEAD Anterior circulation: Large hematoma (4.0 cm x 6.0 cm) in the left frontotemporal lobe similar to the recent contrast-enhanced CT performed  approximate 1 hour prior. High-density contrast is seen in the hematoma compatible with active extravasation. This has improved since the earlier contrast-enhanced CT head. Mild amount of subarachnoid hemorrhage in the left sylvian fissure. Left hemispheric subdural hematoma measures up to 6 mm in diameter. There is low-density edema surrounding the hematoma. Progressive mass-effect on the left ventricle and hemisphere. 11 mm midline shift is approximately the same as the recent study. No evidence of vascular malformation. No aneurysm or AVM. Cavernous carotid widely patent. Anterior and middle cerebral arteries patent bilaterally without stenosis Posterior circulation: Both vertebral arteries patent to the basilar. PICA patent bilaterally. Basilar widely patent. Superior cerebellar and posterior cerebral arteries patent bilaterally without significant stenosis. Venous sinuses: Patent Anatomic variants: None Delayed phase: Not performed IMPRESSION: Negative for vascular malformation. No aneurysm or AVM is seen to account for the large left frontal temporal hematoma. Large hematoma left frontal temporal lobe is stable since the recent CT scan. Extravasated contrast shows improvement since the prior study. Mild amount of subarachnoid hemorrhage. 6 mm left subdural hematoma is stable. There is increased edema in the left hemisphere with increased mass-effect on the lateral ventricle. 11 mm midline shift is unchanged. I reviewed the findings with Dr.  Joya Salm at 1120 hours on 08/18/2015 Electronically Signed   By: Franchot Gallo M.D.   On: 08/18/2015 11:37   Ct Chest W Contrast  08/18/2015  CLINICAL DATA:  Abnormal head CT. History of chest mass and lymphoma. EXAM: CT CHEST, ABDOMEN, AND PELVIS WITH CONTRAST TECHNIQUE: Multidetector CT imaging of the chest, abdomen and pelvis was performed following the standard protocol during bolus administration of intravenous contrast. CONTRAST:  167mL ISOVUE-300 IOPAMIDOL  (ISOVUE-300) INJECTION 61% COMPARISON:  04/07/2011 abdominal CT FINDINGS: CT CHEST THORACIC INLET/BODY WALL: Intubation with endotracheal tube tip above the carina. MEDIASTINUM: Biventricular pacer from the left with leads in unremarkable position. No acute vascular finding. No lymphadenopathy. Low-density mass along the left T3 intercostal nerve is stable or mildly progressed over years (2014 comparison) with 18 mm thickness and 32 mm length. 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 No aggressive features. LUNG WINDOWS: Low volume chest with perihilar streaky and reticular opacities consistent with atelectasis. No central airway obstruction. No suspicious nodules. OSSEOUS: Remote T12 body fracture with kyphosis. The fracture has been fixed by T11-L1 posterior fixation. CT ABDOMEN AND PELVIS Lower chest and abdominal wall:  No contributory findings. Hepatobiliary: No focal liver abnormality.Cholecystectomy with negative common bile duct Pancreas: Stable appearance Spleen: Surgically absent Adrenals/Urinary Tract: Negative adrenals. Left upper pole atrophy, likely compromise of an accessory renal artery. Multiple left renal cortical cysts no hydronephrosis. Negative bladder. Reproductive:Essentially stable 55 mm left fundal fibroid. Tubal ligation clips. Stomach/Bowel:  No obstruction. No arthritic changes. Vascular/Lymphatic: No acute vascular abnormality. Presumed lymphadenectomy clips in the retroperitoneum. Stable prominent lymph nodes along the left gastric, benign given long interval. Peritoneal: No ascites or pneumoperitoneum. Musculoskeletal: No acute or aggressive process. IMPRESSION: 1. No evidence of primary malignancy or metastatic disease in the chest or abdomen. 2. 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 3. Low volume chest with bilateral atelectasis. 4. Chronic findings are stable and described above. Electronically Signed   By: Monte Fantasia M.D.   On:  08/18/2015 10:13   Ct Abdomen Pelvis W Contrast  08/18/2015  CLINICAL DATA:  Abnormal head CT. History of chest mass and lymphoma. EXAM: CT CHEST, ABDOMEN, AND PELVIS WITH CONTRAST TECHNIQUE: Multidetector CT imaging of the chest, abdomen and pelvis was performed following the standard protocol during bolus administration of intravenous contrast. CONTRAST:  151mL ISOVUE-300 IOPAMIDOL (ISOVUE-300) INJECTION 61% COMPARISON:  04/07/2011 abdominal CT FINDINGS: CT CHEST THORACIC INLET/BODY WALL: Intubation with endotracheal tube tip above the carina. MEDIASTINUM: Biventricular pacer from the left with leads in unremarkable position. No acute vascular finding. No lymphadenopathy. Low-density mass along the left T3 intercostal nerve is stable or mildly progressed over years (2014 comparison) with 18 mm thickness and 32 mm length. 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 No aggressive features. LUNG WINDOWS: Low volume chest with perihilar streaky and reticular opacities consistent with atelectasis. No central airway obstruction. No suspicious nodules. OSSEOUS: Remote T12 body fracture with kyphosis. The fracture has been fixed by T11-L1 posterior fixation. CT ABDOMEN AND PELVIS Lower chest and abdominal wall:  No contributory findings. Hepatobiliary: No focal liver abnormality.Cholecystectomy with negative common bile duct Pancreas: Stable appearance Spleen: Surgically absent Adrenals/Urinary Tract: Negative adrenals. Left upper pole atrophy, likely compromise of an accessory renal artery. Multiple left renal cortical cysts no hydronephrosis. Negative bladder. Reproductive:Essentially stable 55 mm left fundal fibroid. Tubal ligation clips. Stomach/Bowel:  No obstruction. No arthritic changes. Vascular/Lymphatic: No acute vascular abnormality. Presumed lymphadenectomy clips in the retroperitoneum. Stable prominent lymph nodes along the left gastric, benign given long interval. Peritoneal: No ascites or pneumoperitoneum.  Musculoskeletal: No acute or aggressive process. IMPRESSION: 1. No evidence of primary malignancy or metastatic disease in the chest or abdomen. 2. 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 3. Low volume chest with bilateral atelectasis. 4. Chronic findings are stable and described above. Electronically Signed   By: Monte Fantasia M.D.   On: 08/18/2015 10:13   Dg Chest Port 1 View  08/19/2015  CLINICAL DATA:  Acute respiratory disease. EXAM: PORTABLE CHEST 1 VIEW COMPARISON:  August 18, 2015. FINDINGS: Endotracheal and nasogastric tubes are unchanged in position. Stable cardiomediastinal silhouette. Left-sided pacemaker is unchanged in position. Right internal jugular catheter is unchanged in position. No pneumothorax is noted. Increased left basilar subsegmental atelectasis is noted. Right lung is clear. Bony thorax is unremarkable. IMPRESSION: Increased left basilar subsegmental atelectasis. Stable support apparatus. Electronically Signed   By: Marijo Conception, M.D.   On: 08/19/2015 07:26   Dg Chest Port 1 View  08/18/2015  CLINICAL DATA:  Status post endotracheal tube and central line placement EXAM: PORTABLE CHEST 1 VIEW COMPARISON:  08/18/2015 FINDINGS: Endotracheal tube tip at the clavicular heads. An orogastric tube reaches stomach at least. Right IJ central line with tip at the lower SVC. No pneumothorax. Biventricular ICD/ pacer from the left, lead in stable position. Low volume chest with bibasilar atelectasis.  Normal heart size. IMPRESSION: 1. New central line in unremarkable position.  No pneumothorax. 2. Low volume chest with basilar atelectasis. Electronically Signed   By: Monte Fantasia M.D.   On: 08/18/2015 16:06   Dg Chest Portable 1 View  08/18/2015  CLINICAL DATA:  Endotracheal and nasogastric tube placements, intubation, chronic systolic heart failure, non ischemic cardiomyopathy, former smoker, personal history non-Hodgkin's lymphoma  EXAM: PORTABLE CHEST 1 VIEW COMPARISON:  Portable exam 0921 hours compared to 05/22/2015 FINDINGS: Tip of endotracheal tube projects 3.4 cm above carina. Nasogastric tube extends into stomach. LEFT subclavian pacemaker/AICD leads project over RIGHT atrium, RIGHT ventricle and coronary sinus. Normal heart size, mediastinal contours and pulmonary vascularity. Atherosclerotic calcification aorta. Bibasilar atelectasis. No definite infiltrate, pleural effusion or pneumothorax. IMPRESSION: Bibasilar atelectasis. Tube positions as above. Electronically Signed   By: Lavonia Dana M.D.   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% H/o LBBB  - not on pressors P:  Cont tele BP goal <160 Hold home antihypertensives Will use PRN labetolol as needed  RENAL A:   No acute  P:   Avoid hypotension Renal dose meds Keep even vol status   GASTROINTESTINAL A:   No acute  P:   Start TF PPI  HEMATOLOGIC A:   No acute (see neuro re: ICH) P:  Hold all blood thinning agents PAS to LEs Trend CBC coags acceptable and no contraindication to surgery   INFECTIOUS A:   NO acute  P:   Trend CBC and fever curve  ENDOCRINE A:   Hypothyroidism  Mild hyperglycemia   P:   Ck TSH Cont synthroid  ssi      FAMILY  - Updates: wife anne updated at bedside 08/19/15 with pastor and RN present  - Inter-disciplinary family meet or Palliative Care  meeting due by:  4/25      The patient is critically ill with multiple organ systems failure and requires high complexity decision making for assessment and support, frequent evaluation and titration of therapies, application of advanced monitoring technologies and extensive interpretation of multiple databases.   Critical Care Time devoted to patient care services described in this note is  30  Minutes. This time reflects time of care of this signee Dr Brand Males. This critical care time does not reflect procedure time, or teaching time or supervisory time of PA/NP/Med student/Med Resident etc but could involve care discussion time    Dr. Brand Males, M.D., [facility].C.P Pulmonary and Critical Care Medicine Staff Physician [facility] Pager: [phone], If no answer or between  15:00h - 7:00h: call [phone]  08/19/2015 9:30 AM

## 2015-08-19 NOTE — Progress Notes (Signed)
Patient ID: Sylvia Moore, female   DOB: 1951/01/30, 65 y.o.   MRN: [id number] Stable. Sedated. Ct done by neurology this am shows less shift, less mass effect, no solid hematoma/tumor? IVC deep BUT draining at 10 cm water. Plan to try to extubate her in am. i will not pull out the catheter till she is extubated. We do not know yet the pathology of the lesion. Family aware of condition. i will not do an EEG at presenT to prevent the removal of the IVC.

## 2015-08-19 NOTE — Progress Notes (Signed)
Initial Nutrition Assessment  DOCUMENTATION CODES:   Obesity unspecified  INTERVENTION:  Initiate Vital High Protein @ 5 ml/hr via OG tube.   60 ml Prostat QID.    MVI daily   Tube feeding regimen provides 920 kcal, 130 grams of protein, and 100 ml of H2O.  TF regimen and propofol at current rate providing 1500 total kcal/day (16 kcal/kg of actual weight)   NUTRITION DIAGNOSIS:   Inadequate oral intake related to inability to eat as evidenced by NPO status.  GOAL:   Provide needs based on ASPEN/SCCM guidelines  MONITOR:   TF tolerance, I & O's, Vent status  REASON FOR ASSESSMENT:   Consult Enteral/tube feeding initiation and management  ASSESSMENT:   Pt with hx of CHF, HOH, depression, GERD admitted with left frontal parenchymal hematoma with subarachnoid and subdural extension s/p craniotomy, evacuation and resection of possible tumor.    Wife at bedside and provides hx. Per wife they have been doing weight watchers and pt has lost about 14 lb, down to 202 lb PTA. Otherwise eating well. No mobility problems. Explained role of nutrition to family. Questions answered.   Patient is currently intubated on ventilator support MV: 9 L/min Temp (24hrs), Avg:99 F (37.2 C), Min:97.4 F (36.3 C), Max:99.9 F (37.7 C)  Propofol: 22 ml/hr provides: 580 kcal per day from lipid  Labs reviewed: CBG: 129 Medications reviewed and include: senokot-s OG tube Nutrition-Focused physical exam completed. Findings are no fat depletion, no muscle depletion, and no edema.    Diet Order:  Diet NPO time specified  Skin:  Reviewed, no issues (incisions)  Last BM:  unknown  Height:   Ht Readings from Last 1 Encounters:  08/18/15 5\' 8"  (1.727 m)    Weight:   Wt Readings from Last 1 Encounters:  08/18/15 202 lb 2.6 oz (91.7 kg)    Ideal Body Weight:  63.6 kg  BMI:  Body mass index is 30.75 kg/(m^2).  Estimated Nutritional Needs:   Kcal:  [id number]  Protein:  >/= 127  grams   Fluid:  >2 L/day  EDUCATION NEEDS:   No education needs identified at this time  [place], [place], [place] Pager [phone] After Hours Pager

## 2015-08-20 ENCOUNTER — Inpatient Hospital Stay (HOSPITAL_COMMUNITY): Payer: BC Managed Care – PPO

## 2015-08-20 LAB — TYPE AND SCREEN
ABO/RH(D): A POS
ANTIBODY SCREEN: NEGATIVE
UNIT DIVISION: 0
Unit division: 0
Unit division: 0
Unit division: 0

## 2015-08-20 LAB — BASIC METABOLIC PANEL
Anion gap: 9 (ref 5–15)
BUN: 27 mg/dL — AB (ref 6–20)
CALCIUM: 8.7 mg/dL — AB (ref 8.9–10.3)
CO2: 22 mmol/L (ref 22–32)
CREATININE: 0.91 mg/dL (ref 0.44–1.00)
Chloride: 108 mmol/L (ref 101–111)
GFR calc Af Amer: >60 mL/min (ref >60)
GLUCOSE: 114 mg/dL — AB (ref 65–99)
Potassium: 3.8 mmol/L (ref 3.5–5.1)
Sodium: 139 mmol/L (ref 135–145)

## 2015-08-20 LAB — CBC WITH DIFFERENTIAL/PLATELET
BASOS ABS: 0 10*3/uL (ref 0.0–0.1)
BASOS PCT: 0 %
Eosinophils Absolute: 0 10*3/uL (ref 0.0–0.7)
Eosinophils Relative: 0 %
HCT: 31.5 % — ABNORMAL LOW (ref 36.0–46.0)
Hemoglobin: 10.4 g/dL — ABNORMAL LOW (ref 12.0–15.0)
Lymphocytes Relative: 15 %
Lymphs Abs: 3 10*3/uL (ref 0.7–4.0)
MCH: 33.7 pg (ref 26.0–34.0)
MCHC: 33 g/dL (ref 30.0–36.0)
MCV: 101.9 fL — ABNORMAL HIGH (ref 78.0–100.0)
MONO ABS: 2.6 10*3/uL — AB (ref 0.1–1.0)
Monocytes Relative: 13 %
Neutro Abs: 15.1 10*3/uL — ABNORMAL HIGH (ref 1.7–7.7)
Neutrophils Relative %: 73 %
PLATELETS: 301 10*3/uL (ref 150–400)
RBC: 3.09 MIL/uL — ABNORMAL LOW (ref 3.87–5.11)
RDW: 13.8 % (ref 11.5–15.5)
WBC: 20.7 10*3/uL — ABNORMAL HIGH (ref 4.0–10.5)

## 2015-08-20 LAB — GLUCOSE, CAPILLARY
GLUCOSE-CAPILLARY: 104 mg/dL — AB (ref 65–99)
Glucose-Capillary: 100 mg/dL — ABNORMAL HIGH (ref 65–99)
Glucose-Capillary: 113 mg/dL — ABNORMAL HIGH (ref 65–99)
Glucose-Capillary: 105 mg/dL — ABNORMAL HIGH (ref 65–99)
Glucose-Capillary: 105 mg/dL — ABNORMAL HIGH (ref 65–99)

## 2015-08-20 LAB — PHOSPHORUS: PHOSPHORUS: 2.5 mg/dL (ref 2.5–4.6)

## 2015-08-20 LAB — MAGNESIUM: Magnesium: 1.8 mg/dL (ref 1.7–2.4)

## 2015-08-20 MED ORDER — ANTISEPTIC ORAL RINSE SOLUTION (CORINZ)
7.0000 mL | OROMUCOSAL | Status: DC
  Administered 2015-08-20 – 2015-08-31 (×107): 7 mL via OROMUCOSAL

## 2015-08-20 MED ORDER — RACEPINEPHRINE HCL 2.25 % IN NEBU
INHALATION_SOLUTION | RESPIRATORY_TRACT | Status: AC
  Filled 2015-08-20: qty 0.5

## 2015-08-20 MED ORDER — POTASSIUM CHLORIDE 20 MEQ/15ML (10%) PO SOLN
40.0000 meq | Freq: Once | ORAL | Status: AC
  Administered 2015-08-20: 40 meq
  Filled 2015-08-20: qty 30

## 2015-08-20 MED ORDER — MAGNESIUM SULFATE 2 GM/50ML IV SOLN
2.0000 g | Freq: Once | INTRAVENOUS | Status: AC
  Administered 2015-08-20: 2 g via INTRAVENOUS
  Filled 2015-08-20: qty 50

## 2015-08-20 NOTE — Progress Notes (Signed)
Speech Language Pathology Discharge Patient Details Name: Sylvia Moore MRN: [id number] DOB: 21-Oct-1950 Today's Date: 08/20/2015 Time:  -     Patient discharged from SLP services secondary to continues to be intubated.  Please see latest therapy progress note for current level of functioning and progress toward goals.    Progress and discharge plan discussed with patient and/or caregiver: Patient unable to participate in discharge planning and no caregivers available  GO     Houston Siren 08/20/2015, 12:07 PM    Orbie Pyo Colvin Caroli.Ed [employer] [phone]

## 2015-08-20 NOTE — Progress Notes (Signed)
STROKE TEAM PROGRESS NOTE   HISTORY OF PRESENT ILLNESS Sylvia Moore is an 65 y.o. female patient whowas brought into the emergency room with altered mental status, acute onset of slurred speech around 7:30 AM 08/18/2015. EMS was called and was brought into the emergency room for evaluation. While she was on the CT scan table, she started having vomiting, followed by a brief convulsive seizure involving her left upper and lower extremities more than the right side with left gaze deviation noted. The convulsive seizure lasted about a minute spontaneously resolved. She was given Ativan 1 mg at that time.  Later, her wife reported that patient has been not feeling well for the past couple of days reported headaches had reduced level of alertness and appear to be dazed. But she is not sure about any new symptoms such as vision or speech problems or any focal motor weakness. Patient unable to provide history due to altered mental status. Information obtained from patient's wife.   Due to worsening of her mental status and difficulty with protecting her airway, she was intubated in the emergency room.  CT of the head showed a large hemorrhagic lesion with small left convexity subdural hematoma. Neurosurgery was consulted for further evaluation.  Due to significant vasogenic edema surrounding this hemorrhagic lesion with mass effect and midline shift which appear to be rather unusual for an acute hemorrhage, a follow-up CT of the head with contrast as well as chest and abdomen CTs with contrast or performed. The follow-up CT showed increase in size of the hemorrhage, and also worsening subdural hemorrhage. Then a follow-up CTA head and neck were performed which did not show any definite evidence of vascular abnormality contributing to this hemorrhage.  Patient was not administered IV t-PA secondary to [place]. She was admitted to the neuro ICU for further evaluation and treatment.   SUBJECTIVE  (INTERVAL HISTORY) Her wife is at the bedside.  Patient intubated. IVC is draining well today. Blood pressure adequately controlled. No recurrent seizures.Neurological exam not much changed.EEG was not done due to head dressing. OBJECTIVE Temp:  [99.7 F (37.6 C)-100.9 F (38.3 C)] 100.2 F (37.9 C) (04/20 1100) Pulse Rate:  [88-106] 103 (04/20 1206) Cardiac Rhythm:  [-] Normal sinus rhythm (04/20 0800) Resp:  [18-28] 28 (04/20 1206) BP: (88-143)/(39-69) 143/58 mmHg (04/20 1206) SpO2:  [100 %] 100 % (04/20 1100) Arterial Line BP: (106-146)/(42-62) 145/57 mmHg (04/20 1100) FiO2 (%):  [30 %] 30 % (04/20 1206) Weight:  [204 lb 9.4 oz (92.8 kg)] 204 lb 9.4 oz (92.8 kg) (04/19 2318)  CBC:  Recent Labs Lab 08/18/15 0832  08/19/15 0430 08/20/15 0340  WBC 8.4  --  23.6* 20.7*  NEUTROABS 4.1  --   --  15.1*  HGB 13.5  < > 12.2 10.4*  HCT 40.3  < > 35.6* 31.5*  MCV 98.8  --  97.8 101.9*  PLT 388  --  352 301  < > = values in this interval not displayed.  Basic Metabolic Panel:   Recent Labs Lab 08/19/15 0430 08/20/15 0340  NA 138 139  K 3.5 3.8  CL 106 108  CO2 22 22  GLUCOSE 144* 114*  BUN 15 27*  CREATININE 0.80 0.91  CALCIUM 9.1 8.7*  MG  --  1.8  PHOS  --  2.5    Lipid Panel:     Component Value Date/Time   CHOL 164 06/19/2014 0057   TRIG 108 08/18/2015 1139   HDL 42 06/19/2014 0057  CHOLHDL 3.9 06/19/2014 0057   VLDL 19 06/19/2014 0057   LDLCALC 103* 06/19/2014 0057   HgbA1c:  Lab Results  Component Value Date   HGBA1C 5.6 06/18/2014   Urine Drug Screen:     Component Value Date/Time   LABOPIA NONE DETECTED 08/18/2015 1044   COCAINSCRNUR NONE DETECTED 08/18/2015 1044   LABBENZ NONE DETECTED 08/18/2015 1044   AMPHETMU NONE DETECTED 08/18/2015 1044   THCU NONE DETECTED 08/18/2015 1044   LABBARB NONE DETECTED 08/18/2015 1044      IMAGING  Ct Head Wo Contrast 08/19/2015  Interval LEFT frontotemporal craniotomy for evacuation of temporal lobe  hematoma and subdural hematoma. Moderate residual LEFT subarachnoid hemorrhage. Trace LEFT tentorial subdural hematoma. Improved, residual 3 mm LEFT to RIGHT midline shift.  08/18/2015   ~ 3cm left frontal parenchymal hematoma with subarachnoid and subdural extension. There may be an underlying mass and CT with and without contrast is recommended. 6 mm midline shift.  08/18/2015  Large area of hemorrhage in the left hemisphere has progressed since 1 hour ago. There is parenchymal hemorrhage with active extravasation in the left frontal operculum. There is also increase in left hemispheric subdural hemorrhage. There is a small amount of subarachnoid hemorrhage in the left sylvian fissure. Increase in low-density edema on the left frontal lobe. Increase in midline shift which is now 11 mm to the right   Ct Angio Head & Neck W/cm &/or Wo/cm 08/18/2015   Negative for vascular malformation. No aneurysm or AVM is seen to account for the large left frontal temporal hematoma. Large hematoma left frontal temporal lobe is stable since the recent CT scan. Extravasated contrast shows improvement since the prior study. Mild amount of subarachnoid hemorrhage. 6 mm left subdural hematoma is stable. There is increased edema in the left hemisphere with increased mass-effect on the lateral ventricle. 11 mm midline shift is unchanged.   Ct Chest Abdomen Pelvis W Contrast 08/18/2015   1. No evidence of primary malignancy or metastatic disease in the chest or abdomen. 2. 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 3. Low volume chest with bilateral atelectasis. 4. Chronic findings are stable   Dg Chest [facility] 08/19/2015   Increased left basilar subsegmental atelectasis. Stable support apparatus.  08/18/2015  1. New central line in unremarkable position.  No pneumothorax. 2. Low volume chest with basilar atelectasis.  08/18/2015   Bibasilar atelectasis. Tube positions as above.     PHYSICAL EXAM Middle-aged Caucasian lady who is sedated on propofol drip and intubated. She has postcraniotomy head bandage. . Afebrile. Head is nontraumatic. Neck is supple without bruit.    Cardiac exam no murmur or gallop. Lungs are clear to auscultation. Distal pulses are well felt.Head dressing. Left eye periorbitall swelling  post  craniotomy Neurological Exam :  Patient is sedated on propofol drip. She does not follow any commands.Eyes are closed. Pupils are both 2 mm reactive. Corneal reflexes are present. Fundi cannot be visualized. Left eye difficult to open. She does not blink to threat on right side. She does not grimace to pain. No spontaneous extremity movements.Moves left upper and lower extremity to sternal rub Purposefully.. Does not move the right side to painful stimuli Except slight movement in the right leg. Purposeful withdrawal on the left leg and arm pain. Minimum withdrawal in the right lower extremity to pain. Now withdrawal in the right upper extremity with hypotonia. Right plantar upgoing left downgoing. ASSESSMENT/PLAN Sylvia Moore is  a 65 y.o. female with history of CHF, schwannoma of spinal cord, orthostatic HTN, GERD, L BBB, Hepatitis and Hodgkin's lymphoma presenting with altered mental status and dysarthria.   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 IVC placed during OR. Suspect hemorrhage secondary to Suspected underlying tumor Pathology pending.   Neurosurgery consulted Joya Salm) 4/19 crani with IVC placed, some "abnormal tissue" removed, ? Tumor. Path pending  Sedated on propofol  NS to assess IVC   Resultant Right hemiplegia    CT abdomen/chest/pelvis no evidence of metastatic disease. 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 L frontal lobe ICH w/ SAH, L SDH.  Mass effect with 56mm midline shift  EEG On hold due to scalp dressing and IVC  SCDs for VTE prophylaxis Diet NPO time specified  No antithrombotic prior to admission  Ongoing aggressive stroke risk factor management  Therapy recommendations:  pending   Disposition:  pending (has a 71 year-old daughter with mental retardation, unable to make decisions)  If source of hemorrhage is tumor, type of tumor will dictate prognosis.  Patient's neurologic condition post extubation will also help guide plans  Respiratory failure  Intubated in the ED following seizure from Hand  Seizure  Left greater than right body involvement  On Keppra 1.5 q 12h  If EEG neg for seizure, will decrease dose  EEG pending  Hypertension  Low 90-100s  Hyperlipidemia  Home meds:  No statin  Mild hyperglycemia  110-120s  Other Stroke Risk Factors  Former Cigarette smoker, quit smoking 47 years ago   ETOH use  Marijuana use  Obesity, Body mass index is 31.11 kg/(m^2).   Family hx stroke (father)  NICM with EF 123456  Chronic systolic CHF   Other Active Problems  History of Hodgkin's lymphoma  History of hepatitis, type unknown  History of spinal cord schwannoma  Hypothyroidism  Leukocytosis 8.4->23.6  Hospital day # 2  I have personally examined this patient, reviewed notes, independently viewed imaging studies, participated in medical decision making and plan of care. I have made any additions or clarifications directly to the above note. Agree with note above.  She presented with altered mental status, slurred speech and a seizure due to left temporal Lobar hematoma as well as small subdural hematoma etiology indeterminate but possibly related to underlying brain tumor .She had emergent craniotomy for hematoma removal.She remains at risk for neurological worsening, hematoma expansion, cerebral edema, brain herniation, recurrent seizures and needs ongoing ventilatory support,  hemodynamic monitoring, and seizure management.  I had a long 30 minute discussion with the patient's wife and the bedside About her prognosis, plan of care  and answered questions. I also discussed the case with   Dr. Chase Caller. Plan is to continue ventilatory support till she needs criteria for extubation. Ventriculostomy catheter management as per Dr. Joya Salm. Continue Keppra for seizures.and strict blood pressure control.  This patient is critically ill and at significant risk of neurological worsening, death and care requires constant monitoring of vital signs, hemodynamics,respiratory and cardiac monitoring, extensive review of multiple databases, frequent neurological assessment, discussion with family, other specialists and medical decision making of high complexity.I have made any additions or clarifications directly to the above note.This critical care time does not reflect procedure time, or teaching time or supervisory time of PA/NP/Med Resident etc but could involve  care discussion time.  I spent 30 minutes of neurocritical care time  in the care of  this patient.     Antony Contras, MD Medical Director [place] Pager: [phone] 08/20/2015 3:32 PM    To contact Stroke Continuity provider, please refer to [email or web address] After hours, contact General Neurology

## 2015-08-20 NOTE — Progress Notes (Signed)
PULMONARY / CRITICAL CARE MEDICINE   Name: Sylvia Moore MRN: [id number] DOB: 06/07/1950    ADMISSION DATE:  08/18/2015 CONSULTATION DATE:  4/18  REFERRING MD:  Regenia Skeeter  CHIEF COMPLAINT:  ICH    BRIEF 65 yof c/o headache the am of 4/18-->followed by aphasia and then somnolence. Had witnessed seizure activity by staff while in CT scan. CT showed large left ICH w/ midline shift. Admitted to [facility] service, w/ plan for probable left Crani pending results of CT angio. She  has a past medical history of Chronic systolic heart failure ([place]); Depression; Gout; HOH (hard of hearing); Schwannoma of spinal cord ([place]); Orthostatic hypotension; NICM (nonischemic cardiomyopathy) (HCC); GERD (gastroesophageal reflux disease); LBBB (left bundle branch block); Pneumonia (1960's X 1; 2014); Hypothyroidism; History of blood transfusion (~ 1976); Hepatitis (~ 1978); and Hodgkin's lymphoma ([place]) H.as past surgical history that includes Hysteroscopy w/D&C; Colonoscopy w/ polypectomy; Lumbar fusion (07/2011); Dilation and curettage of uterus; Abdominal exploration surgery (1975); Knee arthroscopy (Left); Colposcopy (N/A, 08/14/2012); Lesion removal (N/A, 08/14/2012); Rectal biopsy (N/A, 08/14/2012); Esophagogastroduodenoscopy (egd) with propofol (N/A, 01/20/2014); Colonoscopy with propofol (N/A, 01/20/2014); Knee arthroscopy (Right, 2015); left heart catheterization with coronary angiogram (N/A, 07/01/2014); Back surgery; Laparoscopic cholecystectomy (~ 2005); Neck lesion biopsy (Right, 1975); Splenectomy (1970's); and bi-ventricular implantable cardioverter defibrillator (N/A, 07/08/2014).   STUDIES and EVENTS 4/18  - 08/18/2015 - admit and intubated 4/18:~ 3cm left frontal parenchymal hematoma with subarachnoid and subdural extension. 4/18 CT chest/abd/pelvis: 1. No evidence of primary malignancy or metastatic disease in the chest or abdomen. 2. 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 3. Low volume chest with bilateral atelectasis. 4/18 Repeat CT head: Large area of hemorrhage in the left hemisphere has progressed since 1 hour ago. There is parenchymal hemorrhage with active extravasation in the left frontal operculum. There is also increase in left hemispheric subdural hemorrhage. There is a small amount of subarachnoid hemorrhage in the left sylvian fissure. Increase in low-density edema on the left frontal lobe. Increase in midline shift which is now 11 mm to the right 4/18 CT angio of brain >> EEG 4/18>>>  4./18 - Dr Joya Salm "Left frontotemporal craniotomy, evacuation of the subdural hematoma, evacuation of intercerebral hematoma plus resection of abnormal tissue most likely tumor."  08/19/15 - Deeply sedated with diprivan gtt 85mcg. Per RN - neurosurgery does not want WUA . Wife by her side and pastor - both appreciative. PEr RN only flicker movement on right side    SUBJECTIVE/OVERNIGHT/INTERVAL HX 08/20/15 - on TF. Path pending. Dr Joya Salm ok with extubation if patient meets criteria. Per RN - off diprivan - patient not awake. Flicker on right. Left side with spont non-purposeful mvoement and cough +. Doing SBT  VITAL SIGNS: BP 113/56 mmHg  Pulse 95  Temp(Src) 100 F (37.8 C) (Axillary)  Resp 26  Ht 5\' 8"  (1.727 m)  Wt 92.8 kg (204 lb 9.4 oz)  BMI 31.11 kg/m2  SpO2 100%  HEMODYNAMICS:    VENTILATOR SETTINGS: Vent Mode:  [-] CPAP;PSV FiO2 (%):  [30 %] 30 % Set Rate:  [18 bmp] 18 bmp Vt Set:  [500 mL] 500 mL PEEP:  [5 cmH20] 5 cmH20 Pressure Support:  [10 cmH20] 10 cmH20 Plateau Pressure:  [17 cmH20-19 cmH20] 17 cmH20  INTAKE / OUTPUT: I/O last 3 completed shifts: In: 4017.3 [I.V.:3487.3; NG/GT:100; IV Piggyback:430] Out: 2069 [Urine:1990; Drains:79]  PHYSICAL EXAMINATION: General:  Sedated 65 year old white female, currently on full vent support  Neuro:  RASS -3 on diprivan. Left side with mittens on hand HEENT:  PRRL, orally intubated. No  JVD. Left eyelid area swollen Cardiovascular:  rrr w/out MRG Lungs:  Exp wheeze, no accessory use.  Abdomen:  Soft, + bowel sounds No OM Musculoskeletal:  Equal st and bulk Skin:  Warm and dry   LABS:  PULMONARY  Recent Labs Lab 08/18/15 0840 08/18/15 1008 08/18/15 1253 08/19/15 0355  PHART  --  7.447 7.427 7.489*  PCO2ART  --  43.0 39.4 30.6*  PO2ART  --  376.0* 290.0* 101*  HCO3  --  29.7* 26.3* 23.0  TCO2 25 31 28  23.9  O2SAT  --  100.0 100.0 95.8    CBC  Recent Labs Lab 08/18/15 0832  08/18/15 1253 08/19/15 0430 08/20/15 0340  HGB 13.5  < > 14.3 12.2 10.4*  HCT 40.3  < > 42.0 35.6* 31.5*  WBC 8.4  --   --  23.6* 20.7*  PLT 388  --   --  352 301  < > = values in this interval not displayed.  COAGULATION  Recent Labs Lab 08/18/15 0832  INR 1.12    CARDIAC  No results for input(s): TROPONINI in the last 168 hours. No results for input(s): PROBNP in the last 168 hours.   CHEMISTRY  Recent Labs Lab 08/18/15 0832 08/18/15 0840 08/18/15 1253 08/19/15 0430 08/20/15 0340  NA 137 140 138 138 139  K 4.0 4.0 4.0 3.5 3.8  CL 104 103  --  106 108  CO2 24  --   --  22 22  GLUCOSE 132* 130*  --  144* 114*  BUN 18 22*  --  15 27*  CREATININE 0.89 0.80  --  0.80 0.91  CALCIUM 10.0  --   --  9.1 8.7*  MG  --   --   --   --  1.8  PHOS  --   --   --   --  2.5   Estimated Creatinine Clearance: 74.4 mL/min (by C-G formula based on Cr of 0.91).   LIVER  Recent Labs Lab 08/18/15 0832  AST 36  ALT 26  ALKPHOS 85  BILITOT 0.7  PROT 8.4*  ALBUMIN 3.8  INR 1.12     INFECTIOUS No results for input(s): LATICACIDVEN, PROCALCITON in the last 168 hours.   ENDOCRINE CBG (last 3)   Recent Labs  08/19/15 2324 08/20/15 0313 08/20/15 0733  GLUCAP 111* 104* 100*         IMAGING x48h  - image(s) personally visualized  -   highlighted in bold Ct Angio Head W/cm &/or Wo Cm  08/18/2015  CLINICAL DATA:  Intracranial hemorrhage. Rule out vascular  malformation. EXAM: CT ANGIOGRAPHY HEAD AND NECK TECHNIQUE: Multidetector CT imaging of the head and neck was performed using the standard protocol during bolus administration of intravenous contrast. Multiplanar CT image reconstructions and MIPs were obtained to evaluate the vascular anatomy. Carotid stenosis measurements (when applicable) are obtained utilizing NASCET criteria, using the distal internal carotid diameter as the denominator. CONTRAST:  50 mL Isovue 370 IV COMPARISON:  CT head earlier today FINDINGS: CTA NECK Aortic arch: Mild atherosclerotic disease in the aortic arch. No aneurysm or dissection. Proximal great vessels patent. Left subclavian transvenous pacemaker leads noted. Soft tissue mass along the course of the left fourth intercostal nerve extending to the foramen. 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 See separate CT chest report from today. Right carotid system: Right common carotid  artery widely patent. Right carotid bifurcation has minimal atherosclerotic disease without stenosis. Left carotid system: Left common carotid artery widely patent. Minimal atherosclerotic disease in the carotid bulb without significant carotid stenosis. Vertebral arteries:Both vertebral arteries patent to the basilar without stenosis. Skeleton: Mild degenerative change at C5-6. No fracture and no acute bony abnormality. Other neck: The patient is intubated with NG tube in place. No cervical adenopathy. CTA HEAD Anterior circulation: Large hematoma (4.0 cm x 6.0 cm) in the left frontotemporal lobe similar to the recent contrast-enhanced CT performed approximate 1 hour prior. High-density contrast is seen in the hematoma compatible with active extravasation. This has improved since the earlier contrast-enhanced CT head. Mild amount of subarachnoid hemorrhage in the left sylvian fissure. Left hemispheric subdural hematoma measures up to 6 mm in diameter. There is low-density edema surrounding the hematoma.  Progressive mass-effect on the left ventricle and hemisphere. 11 mm midline shift is approximately the same as the recent study. No evidence of vascular malformation. No aneurysm or AVM. Cavernous carotid widely patent. Anterior and middle cerebral arteries patent bilaterally without stenosis Posterior circulation: Both vertebral arteries patent to the basilar. PICA patent bilaterally. Basilar widely patent. Superior cerebellar and posterior cerebral arteries patent bilaterally without significant stenosis. Venous sinuses: Patent Anatomic variants: None Delayed phase: Not performed IMPRESSION: Negative for vascular malformation. No aneurysm or AVM is seen to account for the large left frontal temporal hematoma. Large hematoma left frontal temporal lobe is stable since the recent CT scan. Extravasated contrast shows improvement since the prior study. Mild amount of subarachnoid hemorrhage. 6 mm left subdural hematoma is stable. There is increased edema in the left hemisphere with increased mass-effect on the lateral ventricle. 11 mm midline shift is unchanged. I reviewed the findings with Dr.  Joya Salm at 1120 hours on 08/18/2015 Electronically Signed   By: Franchot Gallo M.D.   On: 08/18/2015 11:37   Ct Head Wo Contrast  08/19/2015  CLINICAL DATA:  Follow-up evaluation. History of non-Hodgkin's lymphoma. EXAM: CT HEAD WITHOUT CONTRAST TECHNIQUE: Contiguous axial images were obtained from the base of the skull through the vertex without intravenous contrast. COMPARISON:  CT head August 18, 2015 FINDINGS: Interval evacuation of LEFT temporal lobe intraparenchymal hematoma with small amount of residual blood products and vasogenic edema. Small amount of LEFT extra-axial pneumocephalus. Moderate amount of subarachnoid hemorrhage in LEFT sylvian fissure extending to LEFT frontoparietal sulci. Evacuation of LEFT subdural hematoma. Interval placement of front ventriculostomy catheter via new high RIGHT frontal burr hole  with distal tip traversing the frontal horn of LEFT lateral ventricle, distal tip in LEFT basal ganglia. 3 mm LEFT to RIGHT midline shift, decreased from 6 mm. Decreased mass effect on LEFT lateral ventricle without entrapment or hydrocephalus. Trace subdural hematoma along LEFT cerebellar tentorium. Basal cisterns are patent. Interval LEFT frontal temporal craniotomy. LEFT scalp soft tissue swelling, subcutaneous gas and skin staples. Small amount RIGHT frontal scalp soft tissue swelling and gas. Imaged paranasal sinuses are well-aerated. New LEFT middle ear and mastoid effusion. Craniotomy extends through the anterior aspect of the mastoid segment LEFT temporal bone. Ocular globes and orbital contents are normal. IMPRESSION: Interval LEFT frontotemporal craniotomy for evacuation of temporal lobe hematoma and subdural hematoma. Moderate residual LEFT subarachnoid hemorrhage. Trace LEFT tentorial subdural hematoma. Improved, residual 3 mm LEFT to RIGHT midline shift. Electronically Signed   By: Elon Alas M.D.   On: 08/19/2015 05:28   Ct Angio Neck W/cm &/or Wo/cm  08/18/2015  CLINICAL DATA:  Intracranial hemorrhage.  Rule out vascular malformation. EXAM: CT ANGIOGRAPHY HEAD AND NECK TECHNIQUE: Multidetector CT imaging of the head and neck was performed using the standard protocol during bolus administration of intravenous contrast. Multiplanar CT image reconstructions and MIPs were obtained to evaluate the vascular anatomy. Carotid stenosis measurements (when applicable) are obtained utilizing NASCET criteria, using the distal internal carotid diameter as the denominator. CONTRAST:  50 mL Isovue 370 IV COMPARISON:  CT head earlier today FINDINGS: CTA NECK Aortic arch: Mild atherosclerotic disease in the aortic arch. No aneurysm or dissection. Proximal great vessels patent. Left subclavian transvenous pacemaker leads noted. Soft tissue mass along the course of the left fourth intercostal nerve extending to  the foramen. 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 See separate CT chest report from today. Right carotid system: Right common carotid artery widely patent. Right carotid bifurcation has minimal atherosclerotic disease without stenosis. Left carotid system: Left common carotid artery widely patent. Minimal atherosclerotic disease in the carotid bulb without significant carotid stenosis. Vertebral arteries:Both vertebral arteries patent to the basilar without stenosis. Skeleton: Mild degenerative change at C5-6. No fracture and no acute bony abnormality. Other neck: The patient is intubated with NG tube in place. No cervical adenopathy. CTA HEAD Anterior circulation: Large hematoma (4.0 cm x 6.0 cm) in the left frontotemporal lobe similar to the recent contrast-enhanced CT performed approximate 1 hour prior. High-density contrast is seen in the hematoma compatible with active extravasation. This has improved since the earlier contrast-enhanced CT head. Mild amount of subarachnoid hemorrhage in the left sylvian fissure. Left hemispheric subdural hematoma measures up to 6 mm in diameter. There is low-density edema surrounding the hematoma. Progressive mass-effect on the left ventricle and hemisphere. 11 mm midline shift is approximately the same as the recent study. No evidence of vascular malformation. No aneurysm or AVM. Cavernous carotid widely patent. Anterior and middle cerebral arteries patent bilaterally without stenosis Posterior circulation: Both vertebral arteries patent to the basilar. PICA patent bilaterally. Basilar widely patent. Superior cerebellar and posterior cerebral arteries patent bilaterally without significant stenosis. Venous sinuses: Patent Anatomic variants: None Delayed phase: Not performed IMPRESSION: Negative for vascular malformation. No aneurysm or AVM is seen to account for the large left frontal temporal hematoma. Large hematoma left frontal temporal lobe is stable since the  recent CT scan. Extravasated contrast shows improvement since the prior study. Mild amount of subarachnoid hemorrhage. 6 mm left subdural hematoma is stable. There is increased edema in the left hemisphere with increased mass-effect on the lateral ventricle. 11 mm midline shift is unchanged. I reviewed the findings with Dr.  Joya Salm at 1120 hours on 08/18/2015 Electronically Signed   By: Franchot Gallo M.D.   On: 08/18/2015 11:37   Dg Chest Port 1 View  08/20/2015  CLINICAL DATA:  Acute respiratory failure with hypoxia, intracranial hemorrhage with cerebral edema EXAM: PORTABLE CHEST 1 VIEW COMPARISON:  Portable chest x-ray of August 19, 2015 FINDINGS: The right lung is less well inflated today but the left lung is adequately inflated. There has been interval clearing of the retrocardiac density. There is no significant pleural effusion and there is no pneumothorax. The heart normal in size. The pulmonary vascularity is not engorged. The implantable pacemaker defibrillator is in stable position. The endotracheal tube tip lies 4.7 cm above the carina. The esophagogastric tube tip and proximal port lie in the gastric body. IMPRESSION: Improved left basilar atelectasis. No significant pleural effusion. The support tubes are in reasonable position. Electronically Signed   By: Shanon Brow  [place] M.D.   On: 08/20/2015 07:51   Dg Chest Port 1 View  08/19/2015  CLINICAL DATA:  Acute respiratory disease. EXAM: PORTABLE CHEST 1 VIEW COMPARISON:  August 18, 2015. FINDINGS: Endotracheal and nasogastric tubes are unchanged in position. Stable cardiomediastinal silhouette. Left-sided pacemaker is unchanged in position. Right internal jugular catheter is unchanged in position. No pneumothorax is noted. Increased left basilar subsegmental atelectasis is noted. Right lung is clear. Bony thorax is unremarkable. IMPRESSION: Increased left basilar subsegmental atelectasis. Stable support apparatus. Electronically Signed   By: Marijo Conception, M.D.   On: 08/19/2015 07:26   Dg Chest Port 1 View  08/18/2015  CLINICAL DATA:  Status post endotracheal tube and central line placement EXAM: PORTABLE CHEST 1 VIEW COMPARISON:  08/18/2015 FINDINGS: Endotracheal tube tip at the clavicular heads. An orogastric tube reaches stomach at least. Right IJ central line with tip at the lower SVC. No pneumothorax. Biventricular ICD/ pacer from the left, lead in stable position. Low volume chest with bibasilar atelectasis.  Normal heart size. IMPRESSION: 1. New central line in unremarkable position.  No pneumothorax. 2. Low volume chest with basilar atelectasis. Electronically Signed   By: Monte Fantasia M.D.   On: 08/18/2015 16:06        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  Acute encephalopathy  H/o spinal cord  Schwannoma   S/p resection of tumor at admit 08/18/2015  - now RASS -3 on diprivan   P:   Imaging per neuro/neuro-surg RASS goal: -3 - no wUA per neuro anticonvulsants Serial neuro-checks Await biopsy result 08/18/15  PULMONARY A: Ventilator dependence s/p ICH and seizure   - doing sbt. On 30% fio2 but does not meet extubation criteria due to mental status P:   Full vent support - no extubation 08/20/15 Aim for normal Pco2 F/u abg PAD protocol  CARDIOVASCULAR A:  NICM w/ EF 35-40% H/o LBBB  - not on pressors P:  Cont tele BP goal <160 Hold home antihypertensives Will use PRN labetolol as needed  RENAL A:   No acute but mild hypomag + P:   Replete mag Avoid hypotension Renal dose meds Keep even vol status   GASTROINTESTINAL A:   No acute  P:   Start TF PPI  HEMATOLOGIC A:   No acute (see neuro re: ICH) P:  Hold all blood thinning agents PAS to LEs Trend CBC coags acceptable and no contraindication to surgery   INFECTIOUS A:   NO acute  P:   Trend CBC and fever curve  ENDOCRINE A:   Hypothyroidism  Mild hyperglycemia   P:    Ck TSH Cont synthroid  ssi      FAMILY  - Updates: wife anne updated at bedside 08/19/15 with pastor and RN present.   - Inter-disciplinary family meet or Palliative Care meeting due by:  4/25. Discussed with Dr Joya Salm at bedside. Updated family sons, sister, wife Sylvia Moore and Marlowe Kays RN at bedside 08/20/15      The patient is critically ill with multiple organ systems failure and requires high complexity decision making for assessment and support, frequent evaluation and titration of therapies, application of advanced monitoring technologies and extensive interpretation of multiple databases.   Critical Care Time devoted to patient care services described in this note is  30  Minutes. This time reflects time of care of this signee Dr Brand Males. This critical care time does not reflect procedure time,  or teaching time or supervisory time of PA/NP/Med student/Med Resident etc but could involve care discussion time    Dr. Brand Males, M.D., [facility].C.P Pulmonary and Critical Care Medicine Staff Physician [facility] Pager: [phone], If no answer or between  15:00h - 7:00h: call [phone]  08/20/2015 10:10 AM

## 2015-08-20 NOTE — Progress Notes (Signed)
Patient ID: Sylvia Moore, female   DOB: 12-05-1950, 65 y.o.   MRN: [id number] Patient stable, open eyes. Moves left side well. To pain moves right foot. IVC working well . If she had minimal drainage yesterday as per neurology was because her ICP was not elevated. i was surprise to see a ct scan less than 10 hours after surgery when my advise to the family was to wait at least 48 hours to prevent her for going down to the ct suite. neurology was planning to remove the dressing to do an EEG but is not an urgent procedure and i do not want to have the IVC pulled out. Family was surprised to be informed by neurology about the high malignancy of the tumor when at the present pathology  has final report and the specimen is being send to [facility] in [place] for review. Yesterday ct head shows that the IVC is a little too deep BUT  Since is working well i will not readjust.  Ccm to help to extubate her when clinically stable.

## 2015-08-20 NOTE — Progress Notes (Signed)
Patient ID: Sylvia Moore, female   DOB: Jan 11, 1951, 65 y.o.   MRN: [id number] Patient stable. Spoke with wife Lelon Frohlich about the specimen being sent to [facility] pathology department for a second opinion

## 2015-08-20 NOTE — Progress Notes (Signed)
OT Cancellation Note  Patient Details Name: Sylvia Moore MRN: [id number] DOB: Oct 12, 1950   Cancelled Treatment:    Reason Eval/Treat Not Completed: Patient not medically ready -  Will sign off at this time.  Please reoder when pt medically ready for therapies.   Darlina Rumpf Oto, OTR/L [id number]  08/20/2015, 11:55 AM

## 2015-08-20 NOTE — Progress Notes (Signed)
PT Cancellation Note  Patient Details Name: Sylvia Moore MRN: [id number] DOB: 23-Jan-1951   Cancelled Treatment:    Reason Eval/Treat Not Completed: Medical issues which prohibited therapy;Patient not medically ready (per nsg sign off await new orders)   Duncan Dull 08/20/2015, 11:43 AM Alben Deeds, PT DPT   [phone]

## 2015-08-20 NOTE — Progress Notes (Signed)
   08/20/15 1334  Clinical Encounter Type  Visited With Patient and family together  Visit Type Initial;Critical Care  Referral From Chaplain  Spiritual Encounters  Spiritual Needs Emotional  Stress Factors  Family Stress Factors Health changes   Chaplain met with patient, patient's wife, daughter, and a variety of friends. Chaplain offered emotional support through empathic listening and facilitating life review. Spiritual care services available as needed.   Jeri Lager, Chaplain 08/20/2015 1:36 PM

## 2015-08-21 ENCOUNTER — Inpatient Hospital Stay (HOSPITAL_COMMUNITY): Payer: BC Managed Care – PPO

## 2015-08-21 LAB — CBC WITH DIFFERENTIAL/PLATELET
Basophils Absolute: 0 10*3/uL (ref 0.0–0.1)
Basophils Relative: 0 %
EOS PCT: 0 %
Eosinophils Absolute: 0 10*3/uL (ref 0.0–0.7)
HEMATOCRIT: 31.1 % — AB (ref 36.0–46.0)
Hemoglobin: 10.2 g/dL — ABNORMAL LOW (ref 12.0–15.0)
LYMPHS ABS: 3.2 10*3/uL (ref 0.7–4.0)
LYMPHS PCT: 15 %
MCH: 33.2 pg (ref 26.0–34.0)
MCHC: 32.8 g/dL (ref 30.0–36.0)
MCV: 101.3 fL — AB (ref 78.0–100.0)
Monocytes Absolute: 2.2 10*3/uL — ABNORMAL HIGH (ref 0.1–1.0)
Monocytes Relative: 10 %
Neutro Abs: 15.4 10*3/uL — ABNORMAL HIGH (ref 1.7–7.7)
Neutrophils Relative %: 74 %
PLATELETS: 298 10*3/uL (ref 150–400)
RBC: 3.07 MIL/uL — AB (ref 3.87–5.11)
RDW: 13.7 % (ref 11.5–15.5)
WBC: 20.8 10*3/uL — AB (ref 4.0–10.5)

## 2015-08-21 LAB — GLUCOSE, CAPILLARY
GLUCOSE-CAPILLARY: 108 mg/dL — AB (ref 65–99)
GLUCOSE-CAPILLARY: 92 mg/dL (ref 65–99)
GLUCOSE-CAPILLARY: 93 mg/dL (ref 65–99)
Glucose-Capillary: 90 mg/dL (ref 65–99)
Glucose-Capillary: 85 mg/dL (ref 65–99)
Glucose-Capillary: 108 mg/dL — ABNORMAL HIGH (ref 65–99)

## 2015-08-21 LAB — BASIC METABOLIC PANEL
Anion gap: 8 (ref 5–15)
BUN: 21 mg/dL — AB (ref 6–20)
CHLORIDE: 110 mmol/L (ref 101–111)
CO2: 23 mmol/L (ref 22–32)
Calcium: 8.6 mg/dL — ABNORMAL LOW (ref 8.9–10.3)
Creatinine, Ser: 0.62 mg/dL (ref 0.44–1.00)
GFR calc Af Amer: >60 mL/min (ref >60)
GLUCOSE: 106 mg/dL — AB (ref 65–99)
POTASSIUM: 3.7 mmol/L (ref 3.5–5.1)
Sodium: 141 mmol/L (ref 135–145)

## 2015-08-21 LAB — TRIGLYCERIDES: TRIGLYCERIDES: 129 mg/dL (ref <150)

## 2015-08-21 LAB — MAGNESIUM: MAGNESIUM: 1.9 mg/dL (ref 1.7–2.4)

## 2015-08-21 LAB — PHOSPHORUS: PHOSPHORUS: 2 mg/dL — AB (ref 2.5–4.6)

## 2015-08-21 MED ORDER — MAGNESIUM SULFATE 2 GM/50ML IV SOLN
2.0000 g | Freq: Once | INTRAVENOUS | Status: AC
  Administered 2015-08-21: 2 g via INTRAVENOUS
  Filled 2015-08-21: qty 50

## 2015-08-21 MED ORDER — PNEUMOCOCCAL VAC POLYVALENT 25 MCG/0.5ML IJ INJ
0.5000 mL | INJECTION | INTRAMUSCULAR | Status: AC
  Administered 2015-08-22: 0.5 mL via INTRAMUSCULAR
  Filled 2015-08-21: qty 0.5

## 2015-08-21 MED ORDER — POTASSIUM CHLORIDE 20 MEQ/15ML (10%) PO SOLN
40.0000 meq | Freq: Once | ORAL | Status: AC
  Administered 2015-08-21: 40 meq
  Filled 2015-08-21: qty 30

## 2015-08-21 MED ORDER — PRO-STAT SUGAR FREE PO LIQD
60.0000 mL | Freq: Three times a day (TID) | ORAL | Status: DC
  Administered 2015-08-21 – 2015-08-30 (×27): 60 mL
  Filled 2015-08-21 (×25): qty 60

## 2015-08-21 MED ORDER — VITAL HIGH PROTEIN PO LIQD
1000.0000 mL | ORAL | Status: DC
  Administered 2015-08-21 – 2015-08-24 (×4): 1000 mL
  Administered 2015-08-24 (×2)
  Administered 2015-08-25 – 2015-08-26 (×2): 1000 mL
  Administered 2015-08-27: 09:00:00
  Administered 2015-08-27 – 2015-08-30 (×4): 1000 mL
  Filled 2015-08-21 (×3): qty 1000

## 2015-08-21 MED ORDER — SODIUM PHOSPHATE 3 MMOLE/ML IV SOLN
10.0000 mmol | Freq: Once | INTRAVENOUS | Status: AC
  Administered 2015-08-21: 10 mmol via INTRAVENOUS
  Filled 2015-08-21: qty 3.33

## 2015-08-21 MED ORDER — POTASSIUM PHOSPHATES 15 MMOLE/5ML IV SOLN: 10.0000 mmol | Freq: Once | INTRAVENOUS | Status: DC

## 2015-08-21 NOTE — Progress Notes (Signed)
PULMONARY / CRITICAL CARE MEDICINE   Name: Sylvia Moore MRN: [id number] DOB: 02-09-1951    ADMISSION DATE:  08/18/2015 CONSULTATION DATE:  4/18  REFERRING MD:  Regenia Skeeter  CHIEF COMPLAINT:  ICH    BRIEF 65 yof c/o headache the am of 4/18-->followed by aphasia and then somnolence. Had witnessed seizure activity by staff while in CT scan. CT showed large left ICH w/ midline shift. Admitted to [facility] service, w/ plan for probable left Crani pending results of CT angio. She  has a past medical history of Chronic systolic heart failure ([place]); Depression; Gout; HOH (hard of hearing); Schwannoma of spinal cord ([place]); Orthostatic hypotension; NICM (nonischemic cardiomyopathy) (HCC); GERD (gastroesophageal reflux disease); LBBB (left bundle branch block); Pneumonia (1960's X 1; 2014); Hypothyroidism; History of blood transfusion (~ 1976); Hepatitis (~ 1978); and Hodgkin's lymphoma ([place]) H.as past surgical history that includes Hysteroscopy w/D&C; Colonoscopy w/ polypectomy; Lumbar fusion (07/2011); Dilation and curettage of uterus; Abdominal exploration surgery (1975); Knee arthroscopy (Left); Colposcopy (N/A, 08/14/2012); Lesion removal (N/A, 08/14/2012); Rectal biopsy (N/A, 08/14/2012); Esophagogastroduodenoscopy (egd) with propofol (N/A, 01/20/2014); Colonoscopy with propofol (N/A, 01/20/2014); Knee arthroscopy (Right, 2015); left heart catheterization with coronary angiogram (N/A, 07/01/2014); Back surgery; Laparoscopic cholecystectomy (~ 2005); Neck lesion biopsy (Right, 1975); Splenectomy (1970's); and bi-ventricular implantable cardioverter defibrillator (N/A, 07/08/2014).   STUDIES and EVENTS 4/18  - 08/18/2015 - admit and intubated 4/18:~ 3cm left frontal parenchymal hematoma with subarachnoid and subdural extension. 4/18 CT chest/abd/pelvis: 1. No evidence of primary malignancy or metastatic disease in the chest or abdomen. 2. 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 3. Low volume chest with bilateral atelectasis. 4/18 Repeat CT head: Large area of hemorrhage in the left hemisphere has progressed since 1 hour ago. There is parenchymal hemorrhage with active extravasation in the left frontal operculum. There is also increase in left hemispheric subdural hemorrhage. There is a small amount of subarachnoid hemorrhage in the left sylvian fissure. Increase in low-density edema on the left frontal lobe. Increase in midline shift which is now 11 mm to the right 4/18 CT angio of brain >> EEG 4/18>>>  4./18 - Dr Joya Salm "Left frontotemporal craniotomy, evacuation of the subdural hematoma, evacuation of intercerebral hematoma plus resection of abnormal tissue most likely tumor."  08/19/15 - Deeply sedated with diprivan gtt 23mcg. Per RN - neurosurgery does not want WUA . Wife by her side and pastor - both appreciative. PEr RN only flicker movement on right side  08/20/15 - on TF. Path pending. Dr Joya Salm ok with extubation if patient meets criteria. Per RN - off diprivan - patient not awake. Flicker on right. Left side with spont non-purposeful mvoement and cough +. Doing SBT   SUBJECTIVE/OVERNIGHT/INTERVAL HX 08/21/15 - Per Dr Joya Salm - path will be several days to result - sent to [facility]. She has improved movement on left and more arousable. Opens eyes. But cough and gag - very weak  VITAL SIGNS: BP 104/52 mmHg  Pulse 90  Temp(Src) 99.9 F (37.7 C) (Core (Comment))  Resp 20  Ht 5\' 8"  (1.727 m)  Wt 92.9 kg (204 lb 12.9 oz)  BMI 31.15 kg/m2  SpO2 100%  HEMODYNAMICS:    VENTILATOR SETTINGS: Vent Mode:  [-] CPAP;PSV FiO2 (%):  [30 %] 30 % Set Rate:  [18 bmp] 18 bmp Vt Set:  [500 mL] 500 mL PEEP:  [5 cmH20] 5 cmH20 Pressure Support:  [10 cmH20] 10 cmH20 Plateau Pressure:  [17 cmH20-20 cmH20] 17  cmH20  INTAKE / OUTPUT: I/O last 3 completed shifts: In: 3766.5 [I.V.:2986.5; NG/GT:385; IV Piggyback:395] Out: 2567 [Urine:2385; Drains:182]  PHYSICAL  EXAMINATION: General:  Sedated 65 year old white female, currently on full vent support Neuro:  RASS -2 off diprivan. Left side with mittens on hand. Weak gag +. Moves left easily,. Rt eye opens to voice HEENT:  PRRL, orally intubated. No JVD. Left eyelid area swollen Cardiovascular:  rrr w/out MRG Lungs:  Exp wheeze, no accessory use.  Abdomen:  Soft, + bowel sounds No OM Musculoskeletal:  Equal st and bulk Skin:  Warm and dry   LABS:  PULMONARY  Recent Labs Lab 08/18/15 0840 08/18/15 1008 08/18/15 1253 08/19/15 0355  PHART  --  7.447 7.427 7.489*  PCO2ART  --  43.0 39.4 30.6*  PO2ART  --  376.0* 290.0* 101*  HCO3  --  29.7* 26.3* 23.0  TCO2 25 31 28  23.9  O2SAT  --  100.0 100.0 95.8    CBC  Recent Labs Lab 08/19/15 0430 08/20/15 0340 08/21/15 0546  HGB 12.2 10.4* 10.2*  HCT 35.6* 31.5* 31.1*  WBC 23.6* 20.7* 20.8*  PLT 352 301 298    COAGULATION  Recent Labs Lab 08/18/15 0832  INR 1.12    CARDIAC  No results for input(s): TROPONINI in the last 168 hours. No results for input(s): PROBNP in the last 168 hours.   CHEMISTRY  Recent Labs Lab 08/18/15 0832 08/18/15 0840 08/18/15 1253 08/19/15 0430 08/20/15 0340 08/21/15 0546  NA 137 140 138 138 139 141  K 4.0 4.0 4.0 3.5 3.8 3.7  CL 104 103  --  106 108 110  CO2 24  --   --  22 22 23   GLUCOSE 132* 130*  --  144* 114* 106*  BUN 18 22*  --  15 27* 21*  CREATININE 0.89 0.80  --  0.80 0.91 0.62  CALCIUM 10.0  --   --  9.1 8.7* 8.6*  MG  --   --   --   --  1.8 1.9  PHOS  --   --   --   --  2.5 2.0*   Estimated Creatinine Clearance: 84.7 mL/min (by C-G formula based on Cr of 0.62).   LIVER  Recent Labs Lab 08/18/15 0832  AST 36  ALT 26  ALKPHOS 85  BILITOT 0.7  PROT 8.4*  ALBUMIN 3.8  INR 1.12     INFECTIOUS No results for input(s): LATICACIDVEN, PROCALCITON in the last 168 hours.   ENDOCRINE CBG (last 3)   Recent Labs  08/20/15 2351 08/21/15 0353 08/21/15 0745  GLUCAP 92  90 85         IMAGING x48h  - image(s) personally visualized  -   highlighted in bold Dg Chest Port 1 View  08/21/2015  CLINICAL DATA:  Endotracheal tube placement.  Ventilator dependence. EXAM: PORTABLE CHEST 1 VIEW COMPARISON:  08/20/2015 FINDINGS: 0618 hours. Endotracheal tube tip is 5.0 cm above the base of the carina. The NG tube passes into the stomach although the distal tip position is not included on the film. Right IJ central line tip overlies the mid SVC. Left sided permanent pacer/ AICD remains in place. Telemetry leads overlie the chest. Stable asymmetric elevation right hemidiaphragm. Slight interval increase in vascular congestion without overt edema. No focal airspace consolidation or pleural effusion. IMPRESSION: Slight increase in vascular congestion.  Otherwise stable exam. Electronically Signed   By: Misty Stanley M.D.   On: 08/21/2015 07:59  Dg Chest Port 1 View  08/20/2015  CLINICAL DATA:  Acute respiratory failure with hypoxia, intracranial hemorrhage with cerebral edema EXAM: PORTABLE CHEST 1 VIEW COMPARISON:  Portable chest x-ray of August 19, 2015 FINDINGS: The right lung is less well inflated today but the left lung is adequately inflated. There has been interval clearing of the retrocardiac density. There is no significant pleural effusion and there is no pneumothorax. The heart normal in size. The pulmonary vascularity is not engorged. The implantable pacemaker defibrillator is in stable position. The endotracheal tube tip lies 4.7 cm above the carina. The esophagogastric tube tip and proximal port lie in the gastric body. IMPRESSION: Improved left basilar atelectasis. No significant pleural effusion. The support tubes are in reasonable position. Electronically Signed   By: David  Martinique M.D.   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 On 30% fio2 but does not meet extubation criteria due to mental status P:   Full vent support - no extubation 08/21/15 Aim for normal Pco2 F/u abg PAD protocol  CARDIOVASCULAR A:  NICM w/ EF 35-40% H/o LBBB  - not on pressors P:  Cont tele BP goal <160 Hold home antihypertensives Will use PRN labetolol as needed  RENAL A:   No acute but mild hypomag + and mild hypophos + P:   Replete mag and phos Avoid hypotension Renal dose meds Keep even vol status   GASTROINTESTINAL A:   No acute  P:    TF PPI  HEMATOLOGIC A:   No acute (see neuro re: ICH) P:  Hold all blood thinning agents PAS to LEs Trend CBC coags acceptable and no contraindication to surgery   INFECTIOUS A:   NO acute  P:   Trend CBC and fever curve  ENDOCRINE A:   Hypothyroidism  Mild hyperglycemia   P:   Ck TSH Cont synthroid  ssi      FAMILY  - Updates: wife anne updated at bedside 08/19/15 with pastor and RN present.  Updated wife 08/21/15 and other family  - Inter-disciplinary family meet or Palliative Care meeting due by:  4/25. Discussed with Dr Joya Salm at bedside. Updated family sons, sister, wife Webb Silversmith and Marlowe Kays RN at bedside 08/20/15      The patient is critically ill with multiple organ systems failure and requires high complexity decision making for assessment and support, frequent evaluation and titration of therapies, application of advanced monitoring technologies and extensive interpretation of multiple databases.   Critical Care Time devoted to patient care services described in this note is  30  Minutes. This time  reflects time of care of this signee Dr Brand Males. This critical care time does not reflect procedure time, or teaching time or supervisory time of PA/NP/Med student/Med Resident etc but could involve care discussion time    Dr. Brand Males, M.D., [facility].C.P Pulmonary and Critical Care Medicine Staff Physician [place] [facility] Pager: [phone], If no  answer or between  15:00h - 7:00h: call [phone]  08/21/2015 9:48 AM

## 2015-08-21 NOTE — Progress Notes (Signed)
STROKE TEAM PROGRESS NOTE   HISTORY OF PRESENT ILLNESS Sylvia Moore is an 65 y.o. female patient whowas brought into the emergency room with altered mental status, acute onset of slurred speech around 7:30 AM 08/18/2015. EMS was called and was brought into the emergency room for evaluation. While she was on the CT scan table, she started having vomiting, followed by a brief convulsive seizure involving her left upper and lower extremities more than the right side with left gaze deviation noted. The convulsive seizure lasted about a minute spontaneously resolved. She was given Ativan 1 mg at that time.  Later, her wife reported that patient has been not feeling well for the past couple of days reported headaches had reduced level of alertness and appear to be dazed. But she is not sure about any new symptoms such as vision or speech problems or any focal motor weakness. Patient unable to provide history due to altered mental status. Information obtained from patient's wife.   Due to worsening of her mental status and difficulty with protecting her airway, she was intubated in the emergency room.  CT of the head showed a large hemorrhagic lesion with small left convexity subdural hematoma. Neurosurgery was consulted for further evaluation.  Due to significant vasogenic edema surrounding this hemorrhagic lesion with mass effect and midline shift which appear to be rather unusual for an acute hemorrhage, a follow-up CT of the head with contrast as well as chest and abdomen CTs with contrast or performed. The follow-up CT showed increase in size of the hemorrhage, and also worsening subdural hemorrhage. Then a follow-up CTA head and neck were performed which did not show any definite evidence of vascular abnormality contributing to this hemorrhage.  Patient was not administered IV t-PA secondary to [place]. She was admitted to the neuro ICU for further evaluation and treatment.   SUBJECTIVE  (INTERVAL HISTORY) Her wife and daughter are at the bedside.  Patient  Remains intubated. IVC is draining well today. Blood pressure adequately controlled. No recurrent seizures.Neurological exam not much changed. . OBJECTIVE Temp:  [99.5 F (37.5 C)-101.3 F (38.5 C)] 100 F (37.8 C) (04/21 1100) Pulse Rate:  [81-112] 99 (04/21 1157) Cardiac Rhythm:  [-] Normal sinus rhythm (04/21 0800) Resp:  [15-28] 24 (04/21 1157) BP: (95-140)/(50-104) 127/61 mmHg (04/21 1157) SpO2:  [94 %-100 %] 100 % (04/21 1157) Arterial Line BP: (85-171)/(42-71) 108/58 mmHg (04/21 1100) FiO2 (%):  [30 %] 30 % (04/21 1157) Weight:  [204 lb 12.9 oz (92.9 kg)] 204 lb 12.9 oz (92.9 kg) (04/21 0100)  CBC:   Recent Labs Lab 08/20/15 0340 08/21/15 0546  WBC 20.7* 20.8*  NEUTROABS 15.1* 15.4*  HGB 10.4* 10.2*  HCT 31.5* 31.1*  MCV 101.9* 101.3*  PLT 301 [id number]    Basic Metabolic Panel:   Recent Labs Lab 08/20/15 0340 08/21/15 0546  NA 139 141  K 3.8 3.7  CL 108 110  CO2 22 23  GLUCOSE 114* 106*  BUN 27* 21*  CREATININE 0.91 0.62  CALCIUM 8.7* 8.6*  MG 1.8 1.9  PHOS 2.5 2.0*    Lipid Panel:     Component Value Date/Time   CHOL 164 06/19/2014 0057   TRIG 129 08/21/2015 0913   HDL 42 06/19/2014 0057   CHOLHDL 3.9 06/19/2014 0057   VLDL 19 06/19/2014 0057   LDLCALC 103* 06/19/2014 0057   HgbA1c:  Lab Results  Component Value Date   HGBA1C 5.6 06/18/2014   Urine Drug Screen:  Component Value Date/Time   LABOPIA NONE DETECTED 08/18/2015 1044   COCAINSCRNUR NONE DETECTED 08/18/2015 1044   LABBENZ NONE DETECTED 08/18/2015 1044   AMPHETMU NONE DETECTED 08/18/2015 1044   THCU NONE DETECTED 08/18/2015 1044   LABBARB NONE DETECTED 08/18/2015 1044      IMAGING  Ct Head Wo Contrast 08/19/2015  Interval LEFT frontotemporal craniotomy for evacuation of temporal lobe hematoma and subdural hematoma. Moderate residual LEFT subarachnoid hemorrhage. Trace LEFT tentorial subdural hematoma.  Improved, residual 3 mm LEFT to RIGHT midline shift.  08/18/2015   ~ 3cm left frontal parenchymal hematoma with subarachnoid and subdural extension. There may be an underlying mass and CT with and without contrast is recommended. 6 mm midline shift.  08/18/2015  Large area of hemorrhage in the left hemisphere has progressed since 1 hour ago. There is parenchymal hemorrhage with active extravasation in the left frontal operculum. There is also increase in left hemispheric subdural hemorrhage. There is a small amount of subarachnoid hemorrhage in the left sylvian fissure. Increase in low-density edema on the left frontal lobe. Increase in midline shift which is now 11 mm to the right   Ct Angio Head & Neck W/cm &/or Wo/cm 08/18/2015   Negative for vascular malformation. No aneurysm or AVM is seen to account for the large left frontal temporal hematoma. Large hematoma left frontal temporal lobe is stable since the recent CT scan. Extravasated contrast shows improvement since the prior study. Mild amount of subarachnoid hemorrhage. 6 mm left subdural hematoma is stable. There is increased edema in the left hemisphere with increased mass-effect on the lateral ventricle. 11 mm midline shift is unchanged.   Ct Chest Abdomen Pelvis W Contrast 08/18/2015   1. No evidence of primary malignancy or metastatic disease in the chest or abdomen. 2. 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 3. Low volume chest with bilateral atelectasis. 4. Chronic findings are stable   Dg Chest [facility] 08/19/2015   Increased left basilar subsegmental atelectasis. Stable support apparatus.  08/18/2015  1. New central line in unremarkable position.  No pneumothorax. 2. Low volume chest with basilar atelectasis.  08/18/2015   Bibasilar atelectasis. Tube positions as above.    PHYSICAL EXAM Middle-aged Caucasian lady who is sedated on propofol drip and intubated. She has postcraniotomy head  bandage. . Afebrile. Head is nontraumatic. Neck is supple without bruit.    Cardiac exam no murmur or gallop. Lungs are clear to auscultation. Distal pulses are well felt.Head dressing. Left eye periorbitall swelling  post  craniotomy Neurological Exam :  Patient is sedated on propofol drip. She does not follow any commands.Eyes are closed. Pupils are both 2 mm reactive. Corneal reflexes are present. Fundi cannot be visualized. Left eye difficult to open. She does not blink to threat on right side. She does not grimace to pain. No spontaneous extremity movements.Moves left upper and lower extremity to sternal rub Purposefully.. Does not move the right side to painful stimuli Except slight movement in the right leg. Purposeful withdrawal on the left leg and arm pain. Minimum withdrawal in the right lower extremity to pain. Now withdrawal in the right upper extremity with hypotonia. Right plantar upgoing left downgoing. ASSESSMENT/PLAN Sylvia Moore is a 65 y.o. female with history of CHF, schwannoma of spinal cord, orthostatic HTN, GERD, L BBB, Hepatitis and Hodgkin's lymphoma presenting with altered mental status and dysarthria.   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 IVC placed during OR. Suspect hemorrhage secondary to Suspected underlying tumor Pathology pending.   Neurosurgery consulted Joya Salm) 4/19 crani with IVC placed, some "abnormal tissue" removed, ? Tumor. Path pending  Sedated on propofol  NS to assess IVC   Resultant Right hemiplegia    CT abdomen/chest/pelvis no evidence of metastatic disease. 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 L frontal lobe ICH w/ SAH, L SDH. Mass effect with 30mm midline shift  EEG On hold due to scalp dressing and IVC  SCDs for VTE prophylaxis Diet NPO time  specified  No antithrombotic prior to admission  Ongoing aggressive stroke risk factor management  Therapy recommendations:  pending   Disposition:  pending (has a 60 year-old daughter with mental retardation, unable to make decisions)  If source of hemorrhage is tumor, type of tumor will dictate prognosis.  Patient's neurologic condition post extubation will also help guide plans  Respiratory failure  Intubated in the ED following seizure from [place]  Seizure  Left greater than right body involvement  On Keppra 1.5 q 12h  If EEG neg for seizure, will decrease dose  EEG pending  Hypertension  Low 90-100s  Hyperlipidemia  Home meds:  No statin  Mild hyperglycemia  110-120s  Other Stroke Risk Factors  Former Cigarette smoker, quit smoking 47 years ago   ETOH use  Marijuana use  Obesity, Body mass index is 31.15 kg/(m^2).   Family hx stroke (father)  NICM with EF 123456  Chronic systolic CHF   Other Active Problems  History of Hodgkin's lymphoma  History of hepatitis, type unknown  History of spinal cord schwannoma  Hypothyroidism  Leukocytosis 8.4->23.6  Hospital day # 3  I have personally examined this patient, reviewed notes, independently viewed imaging studies, participated in medical decision making and plan of care.   .  She presented with altered mental status, slurred speech and a seizure due to left temporal Lobar hematoma as well as small subdural hematoma etiology indeterminate but possibly related to underlying brain tumor .She had emergent craniotomy for hematoma removal.She  needs ongoing ventilatory support, hemodynamic monitoring, and seizure management.  I had a long 20 minute discussion with the patient's wife and the bedside About her prognosis, plan of care  and answered questions. I also discussed the case with   Dr. Chase Caller. Plan is to continue ventilatory support till she needs criteria for extubation. Ventriculostomy catheter  management as per Dr. Joya Salm. Continue Keppra for seizures.and strict blood pressure control.  This patient is critically ill and at significant risk of neurological worsening, death and care requires constant monitoring of vital signs, hemodynamics,respiratory and cardiac monitoring, extensive review of multiple databases, frequent neurological assessment, discussion with family, other specialists and medical decision making of high complexity.I have made any additions or clarifications directly to the above note.This critical care time does not reflect procedure time, or teaching time or supervisory time of PA/NP/Med Resident etc but could involve care discussion time.  I spent 31 minutes of neurocritical care time  in the care of  this patient.     Antony Contras, MD Medical Director [place] Pager: [phone] 08/21/2015 12:55 PM    To contact Stroke Continuity provider, please refer to [email or web address] After hours, contact General Neurology

## 2015-08-21 NOTE — Progress Notes (Signed)
Nutrition Follow-up  DOCUMENTATION CODES:   Obesity unspecified  INTERVENTION:   Increase Vital High Protein to 20 ml/hr 60 ml Prostat TID Provides: 1080 kcal, 132 grams protein, and 401 ml H2O TF regimen and propofol at current rate providing 1153 total kcal/day    NUTRITION DIAGNOSIS:   Inadequate oral intake related to inability to eat as evidenced by NPO status. Ongoing.   GOAL:   Provide needs based on ASPEN/SCCM guidelines Progressing.   MONITOR:   TF tolerance, I & O's, Vent status  ASSESSMENT:   Pt with hx of CHF, HOH, depression, GERD admitted with left frontal parenchymal hematoma with subarachnoid and subdural extension s/p craniotomy, evacuation and resection of possible tumor.   Patient is currently intubated on ventilator support MV: 8.6 L/min Temp (24hrs), Avg:100.4 F (38 C), Min:99.5 F (37.5 C), Max:101.3 F (38.5 C)  Propofol: 2.8 ml/hr provides 73 kcal per day  Medications reviewed and include: magnesium sulfate, MVI, sodium phosphate, senokot-s Labs reviewed: phosphorus low 2.0  Pt discussed during ICU rounds and with RN.  Tumor path unknown sent to [facility]  Diet Order:  Diet NPO time specified  Skin:  Reviewed, no issues (incisions)  Last BM:  4/20  Height:   Ht Readings from Last 1 Encounters:  08/18/15 5\' 8"  (1.727 m)    Weight:   Wt Readings from Last 1 Encounters:  08/21/15 204 lb 12.9 oz (92.9 kg)    Ideal Body Weight:  63.6 kg  BMI:  Body mass index is 31.15 kg/(m^2).  Estimated Nutritional Needs:   Kcal:  [id number]  Protein:  >/= 127 grams   Fluid:  >2 L/day  EDUCATION NEEDS:   No education needs identified at this time  [place], [place], [place] Pager [phone] After Hours Pager

## 2015-08-21 NOTE — Progress Notes (Signed)
Patient ID: Sylvia Moore, female   DOB: 1951/03/06, 65 y.o.   MRN: [id number] Stable, open voice to commands. Moves left side and right foot to pain. IVC working. Spoke with CCM about no final pathology result.they will continue with extubation protocol.

## 2015-08-22 ENCOUNTER — Inpatient Hospital Stay (HOSPITAL_COMMUNITY): Payer: BC Managed Care – PPO

## 2015-08-22 DIAGNOSIS — Z9889 Other specified postprocedural states: Secondary | ICD-10-CM

## 2015-08-22 DIAGNOSIS — R509 Fever, unspecified: Secondary | ICD-10-CM

## 2015-08-22 DIAGNOSIS — G939 Disorder of brain, unspecified: Secondary | ICD-10-CM

## 2015-08-22 LAB — URINALYSIS W MICROSCOPIC (NOT AT ARMC)
GLUCOSE, UA: NEGATIVE mg/dL
Ketones, ur: NEGATIVE mg/dL
NITRITE: NEGATIVE
PH: 6 (ref 5.0–8.0)
Protein, ur: NEGATIVE mg/dL
SPECIFIC GRAVITY, URINE: 1.024 (ref 1.005–1.030)

## 2015-08-22 LAB — BASIC METABOLIC PANEL
Anion gap: 8 (ref 5–15)
BUN: 18 mg/dL (ref 6–20)
CALCIUM: 8.5 mg/dL — AB (ref 8.9–10.3)
CO2: 25 mmol/L (ref 22–32)
CREATININE: 0.62 mg/dL (ref 0.44–1.00)
Chloride: 106 mmol/L (ref 101–111)
Glucose, Bld: 111 mg/dL — ABNORMAL HIGH (ref 65–99)
Potassium: 3.8 mmol/L (ref 3.5–5.1)
SODIUM: 139 mmol/L (ref 135–145)

## 2015-08-22 LAB — CBC WITH DIFFERENTIAL/PLATELET
BASOS PCT: 0 %
Basophils Absolute: 0 10*3/uL (ref 0.0–0.1)
EOS ABS: 0.1 10*3/uL (ref 0.0–0.7)
EOS PCT: 0 %
HEMATOCRIT: 29.9 % — AB (ref 36.0–46.0)
Hemoglobin: 9.7 g/dL — ABNORMAL LOW (ref 12.0–15.0)
Lymphocytes Relative: 13 %
Lymphs Abs: 2.3 10*3/uL (ref 0.7–4.0)
MCH: 33 pg (ref 26.0–34.0)
MCHC: 32.4 g/dL (ref 30.0–36.0)
MCV: 101.7 fL — ABNORMAL HIGH (ref 78.0–100.0)
MONO ABS: 1.9 10*3/uL — AB (ref 0.1–1.0)
MONOS PCT: 11 %
Neutro Abs: 13.5 10*3/uL — ABNORMAL HIGH (ref 1.7–7.7)
Neutrophils Relative %: 76 %
PLATELETS: 325 10*3/uL (ref 150–400)
RBC: 2.94 MIL/uL — ABNORMAL LOW (ref 3.87–5.11)
RDW: 13.4 % (ref 11.5–15.5)
WBC: 17.8 10*3/uL — ABNORMAL HIGH (ref 4.0–10.5)

## 2015-08-22 LAB — GLUCOSE, CAPILLARY
GLUCOSE-CAPILLARY: 93 mg/dL (ref 65–99)
GLUCOSE-CAPILLARY: 100 mg/dL — AB (ref 65–99)
GLUCOSE-CAPILLARY: 107 mg/dL — AB (ref 65–99)
GLUCOSE-CAPILLARY: 108 mg/dL — AB (ref 65–99)
GLUCOSE-CAPILLARY: 103 mg/dL — AB (ref 65–99)
Glucose-Capillary: 105 mg/dL — ABNORMAL HIGH (ref 65–99)
Glucose-Capillary: 119 mg/dL — ABNORMAL HIGH (ref 65–99)

## 2015-08-22 LAB — PHOSPHORUS: PHOSPHORUS: 3 mg/dL (ref 2.5–4.6)

## 2015-08-22 LAB — MAGNESIUM: Magnesium: 2.1 mg/dL (ref 1.7–2.4)

## 2015-08-22 MED ORDER — POTASSIUM CHLORIDE 20 MEQ/15ML (10%) PO SOLN
40.0000 meq | Freq: Once | ORAL | Status: AC
  Administered 2015-08-22: 40 meq
  Filled 2015-08-22: qty 30

## 2015-08-22 NOTE — Progress Notes (Signed)
Patient ID: Sylvia Moore, female   DOB: January 04, 1951, 65 y.o.   MRN: [id number] Subjective:  The patient is in no apparent distress.  Objective: Vital signs in last 24 hours: Temp:  [99.7 F (37.6 C)-101.5 F (38.6 C)] 100.2 F (37.9 C) (04/22 0800) Pulse Rate:  [91-119] 105 (04/22 0800) Resp:  [17-28] 17 (04/22 0800) BP: (99-133)/(27-97) 123/59 mmHg (04/22 0800) SpO2:  [98 %-100 %] 100 % (04/22 0800) Arterial Line BP: (102-116)/(57-71) 108/58 mmHg (04/21 1100) FiO2 (%):  [30 %] 30 % (04/22 0800) Weight:  [95.3 kg (210 lb 1.6 oz)] 95.3 kg (210 lb 1.6 oz) (04/22 0438)  Intake/Output from previous day: 04/21 0701 - 04/22 0700 In: 2668.8 [I.V.:1880.4; NG/GT:370; IV Piggyback:418.3] Out: 2081 [Urine:1965; Drains:116] Intake/Output this shift: Total I/O In: 79.4 [I.V.:79.4] Out: 157 [Urine:150; Drains:7]  Physical exam Glasgow Coma Scale 8 intubated. [id number]. The patient localizes on the left. She extends on the right pain. Her pupils are equal.  The patient's ventriculostomy is patent.  Lab Results:  Recent Labs  08/21/15 0546 08/22/15 0520  WBC 20.8* 17.8*  HGB 10.2* 9.7*  HCT 31.1* 29.9*  PLT 298 325   BMET  Recent Labs  08/21/15 0546 08/22/15 0520  NA 141 139  K 3.7 3.8  CL 110 106  CO2 23 25  GLUCOSE 106* 111*  BUN 21* 18  CREATININE 0.62 0.62  CALCIUM 8.6* 8.5*    Studies/Results: Dg Chest Port 1 View  08/22/2015  CLINICAL DATA:  Followup intubated patient. EXAM: PORTABLE CHEST 1 VIEW COMPARISON:  08/21/2015 FINDINGS: Lungs essentially clear.  No pleural effusion or pneumothorax. Cardiac silhouette is normal in size. Left anterior chest wall biventricular cardioverter-defibrillator is stable. Endotracheal tube tip projects 4.4 cm above the Carina, stable. Nasal/orogastric tube passes below the diaphragm well into the stomach. Right internal jugular central venous line tip projects in the lower superior vena cava, also unchanged. IMPRESSION: 1. No acute  cardiopulmonary disease. 2. Stable support apparatus. Electronically Signed   By: Lajean Manes M.D.   On: 08/22/2015 07:20   Dg Chest Port 1 View  08/21/2015  CLINICAL DATA:  Endotracheal tube placement.  Ventilator dependence. EXAM: PORTABLE CHEST 1 VIEW COMPARISON:  08/20/2015 FINDINGS: 0618 hours. Endotracheal tube tip is 5.0 cm above the base of the carina. The NG tube passes into the stomach although the distal tip position is not included on the film. Right IJ central line tip overlies the mid SVC. Left sided permanent pacer/ AICD remains in place. Telemetry leads overlie the chest. Stable asymmetric elevation right hemidiaphragm. Slight interval increase in vascular congestion without overt edema. No focal airspace consolidation or pleural effusion. IMPRESSION: Slight increase in vascular congestion.  Otherwise stable exam. Electronically Signed   By: Misty Stanley M.D.   On: 08/21/2015 07:59    Assessment/Plan: Postop day #3: We will continue supportive care and await the patient's pathology.  LOS: 4 days     Kendry Pfarr D 08/22/2015, 8:49 AM

## 2015-08-22 NOTE — Progress Notes (Signed)
STROKE TEAM PROGRESS NOTE   SUBJECTIVE (INTERVAL HISTORY) Her wife and daughter as well as son are at the bedside.  Patient remains intubated but off sedation. EVD is draining 3-5cc/h overnight. Blood pressure adequately controlled. No recurrent seizures. Left side spontaneous movement.  OBJECTIVE Temp:  [99.7 F (37.6 C)-101.5 F (38.6 C)] 100 F (37.8 C) (04/22 0700) Pulse Rate:  [90-119] 105 (04/22 0700) Cardiac Rhythm:  [-] Normal sinus rhythm (04/21 2000) Resp:  [18-28] 23 (04/22 0700) BP: (99-133)/(27-97) 117/60 mmHg (04/22 0700) SpO2:  [98 %-100 %] 98 % (04/22 0700) Arterial Line BP: (100-116)/(56-71) 108/58 mmHg (04/21 1100) FiO2 (%):  [30 %] 30 % (04/22 0700) Weight:  [95.3 kg (210 lb 1.6 oz)] 95.3 kg (210 lb 1.6 oz) (04/22 0438)  CBC:   Recent Labs Lab 08/21/15 0546 08/22/15 0520  WBC 20.8* 17.8*  NEUTROABS 15.4* 13.5*  HGB 10.2* 9.7*  HCT 31.1* 29.9*  MCV 101.3* 101.7*  PLT 298 [id number]    Basic Metabolic Panel:   Recent Labs Lab 08/21/15 0546 08/22/15 0520  NA 141 139  K 3.7 3.8  CL 110 106  CO2 23 25  GLUCOSE 106* 111*  BUN 21* 18  CREATININE 0.62 0.62  CALCIUM 8.6* 8.5*  MG 1.9 2.1  PHOS 2.0* 3.0    Lipid Panel:     Component Value Date/Time   CHOL 164 06/19/2014 0057   TRIG 129 08/21/2015 0913   HDL 42 06/19/2014 0057   CHOLHDL 3.9 06/19/2014 0057   VLDL 19 06/19/2014 0057   LDLCALC 103* 06/19/2014 0057   HgbA1c:  Lab Results  Component Value Date   HGBA1C 5.6 06/18/2014   Urine Drug Screen:     Component Value Date/Time   LABOPIA NONE DETECTED 08/18/2015 1044   COCAINSCRNUR NONE DETECTED 08/18/2015 1044   LABBENZ NONE DETECTED 08/18/2015 1044   AMPHETMU NONE DETECTED 08/18/2015 1044   THCU NONE DETECTED 08/18/2015 1044   LABBARB NONE DETECTED 08/18/2015 1044      IMAGING I have personally reviewed the radiological images below and agree with the radiology interpretations.  Ct Head Wo Contrast 08/19/2015  Interval LEFT  frontotemporal craniotomy for evacuation of temporal lobe hematoma and subdural hematoma. Moderate residual LEFT subarachnoid hemorrhage. Trace LEFT tentorial subdural hematoma. Improved, residual 3 mm LEFT to RIGHT midline shift.  08/18/2015   ~ 3cm left frontal parenchymal hematoma with subarachnoid and subdural extension. There may be an underlying mass and CT with and without contrast is recommended. 6 mm midline shift.  08/18/2015  Large area of hemorrhage in the left hemisphere has progressed since 1 hour ago. There is parenchymal hemorrhage with active extravasation in the left frontal operculum. There is also increase in left hemispheric subdural hemorrhage. There is a small amount of subarachnoid hemorrhage in the left sylvian fissure. Increase in low-density edema on the left frontal lobe. Increase in midline shift which is now 11 mm to the right   Ct Angio Head & Neck W/cm &/or Wo/cm 08/18/2015   Negative for vascular malformation. No aneurysm or AVM is seen to account for the large left frontal temporal hematoma. Large hematoma left frontal temporal lobe is stable since the recent CT scan. Extravasated contrast shows improvement since the prior study. Mild amount of subarachnoid hemorrhage. 6 mm left subdural hematoma is stable. There is increased edema in the left hemisphere with increased mass-effect on the lateral ventricle. 11 mm midline shift is unchanged.   Ct Chest Abdomen Pelvis W Contrast 08/18/2015  1. No evidence of primary malignancy or metastatic disease in the chest or abdomen. 2. 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 3. Low volume chest with bilateral atelectasis. 4. Chronic findings are stable   Dg Chest [facility] 08/19/2015   Increased left basilar subsegmental atelectasis. Stable support apparatus.  08/18/2015  1. New central line in unremarkable position.  No pneumothorax. 2. Low volume chest with basilar atelectasis.   08/18/2015   Bibasilar atelectasis. Tube positions as above.   2D echo - pending   PHYSICAL EXAM Middle-aged Caucasian lady intubated off sedation. She has postcraniotomy head bandage.Afebrile. Head is nontraumatic. Neck is supple without bruit. Cardiac exam no murmur or gallop. Lungs are clear to auscultation. Distal pulses are well felt. Head dressing. Left eye periorbitall swelling post craniotomy.  Neurological Exam  Patient is off sedation. She does not follow any commands. Eyes briefly opened on voice. Pupils are both 2 mm reactive. Corneal reflexes are present. Fundi cannot be visualized. Left eye difficult to open. She does not grimace to pain. Spontaneous extremity movements on left upper and lower extremity. Does not move the right side to painful stimuli except slight movement in the right leg. Purposeful withdrawal on the left leg and arm pain. Right plantar upgoing left downgoing.  ASSESSMENT/PLAN Sylvia Moore is a 65 y.o. female with history of CHF, schwannoma of spinal cord, orthostatic HTN, GERD, L BBB, Hepatitis and Hodgkin's lymphoma presenting with altered mental status and dysarthria.   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 IVC placed during OR. Suspect hemorrhage secondary to underlying tumor. Pathology pending.   Resultant Right hemiplegia    Neurosurgery consulted Joya Salm) 4/19 crani with IVC placed, some "abnormal tissue" removed, ? Tumor. Path pending   CT abdomen/chest/pelvis no evidence of metastatic disease. 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 L frontal lobe ICH w/ SAH, L SDH. Mass effect with 49mm midline shift  Repeat CT head in am  TTE pending  EEG On hold due to scalp dressing and IVC  SCDs for VTE prophylaxis Diet NPO time specified  No antithrombotic  prior to admission, no antithrombotic now due to [place]  Ongoing aggressive stroke risk factor management  Therapy recommendations:  pending   Disposition:  pending (has a 33 year-old daughter with mental retardation, unable to make decisions)  If source of hemorrhage is tumor, type of tumor will dictate prognosis.  Patient's neurologic condition post extubation will also help guide plans  Respiratory failure  Intubated in the ED following seizure from [place]  Still on vent but off sedation  Extubate when able  CCM on board  Seizure  Left greater than right body involvement  On Keppra 1.5g q 12h  If EEG neg for seizure, will decrease dose  EEG pending  Fever   WBC 20.8->17.8  Blood culture   UA   Sputum culture  If constant, will need check CSF from EVD  Hypertension  Stable now  Other Stroke Risk Factors  Former Cigarette smoker, quit smoking 47 years ago   ETOH use  Marijuana use  Obesity, Body mass index is 31.95 kg/(m^2).   Family hx stroke (father)  Chronic systolic CHF - EF 123456 in 01/2015  Other Active Problems  History of Hodgkin's lymphoma  History  of hepatitis, type unknown  History of spinal cord schwannoma  Hypothyroidism  Hospital day # 4  This patient is critically ill due to left frontal ICH with mass effect s/p evacuation and EVD, suspicious for brain mass, fever and at significant risk of neurological worsening, death form CNS infection, recurrent bleeding, cerebral edema and brain herniation. This patient's care requires constant monitoring of vital signs, hemodynamics, respiratory and cardiac monitoring, review of multiple databases, neurological assessment, discussion with family, other specialists and medical decision making of high complexity. I spent 40 minutes of neurocritical care time in the care of this patient.   Rosalin Hawking, MD PhD Stroke Neurology 08/22/2015 5:50 PM     To contact Stroke Continuity provider,  please refer to [email or web address] After hours, contact General Neurology

## 2015-08-22 NOTE — Progress Notes (Signed)
Pt has a cough reflex, but does not gag when Yankauer is placed deep in throat- noted a swallow relex.  Pt w/ large amount of oral secretions, pt is not alert.  RN and I spoke w/ Dr Chase Caller- per MD, no plans for extubation today but it is okay to continue to wean on vent at this time.

## 2015-08-22 NOTE — Progress Notes (Signed)
PULMONARY / CRITICAL CARE MEDICINE   Name: Sylvia Moore MRN: [id number] DOB: 03/01/1951    ADMISSION DATE:  08/18/2015 CONSULTATION DATE:  4/18  REFERRING MD:  Regenia Skeeter  CHIEF COMPLAINT:  ICH    BRIEF 65 yof c/o headache the am of 4/18-->followed by aphasia and then somnolence. Had witnessed seizure activity by staff while in CT scan. CT showed large left ICH w/ midline shift. Admitted to [facility] service, w/ plan for probable left Crani pending results of CT angio.   She  has a past medical history of Chronic systolic heart failure ([place]); Depression; Gout; HOH (hard of hearing); Schwannoma of spinal cord ([place]); Orthostatic hypotension; NICM (nonischemic cardiomyopathy) (H[facility]); GERD (gastroesophageal reflux disease); LBBB (left bundle branch block); Pneumonia (1960's X 1; 2014); Hypothyroidism; History of blood transfusion (~ 1976); Hepatitis (~ 1978); and Hodgkin's lymphoma ([place])    STUDIES and EVENTS 4/18  - 08/18/2015 - admit and intubated 4/18:~ 3cm left frontal parenchymal hematoma with subarachnoid and subdural extension. 4/18 CT chest/abd/pelvis: 1. No evidence of primary malignancy or metastatic disease in the chest or abdomen. 2. 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 3. Low volume chest with bilateral atelectasis. 4/18 Repeat CT head: Large area of hemorrhage in the left hemisphere has progressed since 1 hour ago. There is parenchymal hemorrhage with active extravasation in the left frontal operculum. There is also increase in left hemispheric subdural hemorrhage. There is a small amount of subarachnoid hemorrhage in the left sylvian fissure. Increase in low-density edema on the left frontal lobe. Increase in midline shift which is now 11 mm to the right 4/18 CT angio of brain >> EEG 4/18>>>  4./18 - Dr Joya Salm "Left frontotemporal craniotomy, evacuation of the subdural hematoma, evacuation of intercerebral hematoma plus resection  of abnormal tissue most likely tumor."  08/19/15 - Deeply sedated with diprivan gtt 53mcg. Per RN - neurosurgery does not want WUA . Wife by her side and pastor - both appreciative. PEr RN only flicker movement on right side  08/20/15 - on TF. Path pending. Dr Joya Salm ok with extubation if patient meets criteria. Per RN - off diprivan - patient not awake. Flicker on right. Left side with spont non-purposeful mvoement and cough +. Doing SBT   08/21/15 - Per Dr Joya Salm - path will be several days to result - sent to [facility]. She has improved movement on left and more arousable. Opens eyes. But cough and gag - very weak    SUBJECTIVE/OVERNIGHT/INTERVAL HX 08/22/15 - ongoing fevers low grade despite tylenol. Getting ICE prn. STronger cough on suctioning today but WUA and SBT just started   VITAL SIGNS: BP 117/60 mmHg  Pulse 105  Temp(Src) 100 F (37.8 C) (Core (Comment))  Resp 23  Ht 5\' 8"  (1.727 m)  Wt 95.3 kg (210 lb 1.6 oz)  BMI 31.95 kg/m2  SpO2 98%  HEMODYNAMICS:    VENTILATOR SETTINGS: Vent Mode:  [-] PRVC FiO2 (%):  [30 %] 30 % Set Rate:  [18 bmp] 18 bmp Vt Set:  [500 mL] 500 mL PEEP:  [5 cmH20] 5 cmH20 Pressure Support:  [10 cmH20] 10 cmH20 Plateau Pressure:  [18 cmH20] 18 cmH20  INTAKE / OUTPUT: I/O last 3 completed shifts: In: 3724.3 [I.V.:2765.9; NG/GT:425; IV Piggyback:533.3] Out: 3071 [Urine:2890; Drains:181]  PHYSICAL EXAMINATION: General:  Sedated 65 year old white female, currently on full vent support Neuro:  RASS -2 off diprivan. Left side with mittens on hand. Weak gag +. Moves  left easily,. Rt eye opens to voice HEENT:  PRRL, orally intubated. No JVD. Left eyelid area swollen. SCalp left side with sutures Cardiovascular:  rrr w/out MRG Lungs:  CTA and sync with vent Abdomen:  Soft, + bowel sounds No OM Musculoskeletal:  Equal st and bulk Skin:  Warm and dry   LABS:  PULMONARY  Recent Labs Lab 08/18/15 0840 08/18/15 1008 08/18/15 1253  08/19/15 0355  PHART  --  7.447 7.427 7.489*  PCO2ART  --  43.0 39.4 30.6*  PO2ART  --  376.0* 290.0* 101*  HCO3  --  29.7* 26.3* 23.0  TCO2 25 31 28  23.9  O2SAT  --  100.0 100.0 95.8    CBC  Recent Labs Lab 08/20/15 0340 08/21/15 0546 08/22/15 0520  HGB 10.4* 10.2* 9.7*  HCT 31.5* 31.1* 29.9*  WBC 20.7* 20.8* 17.8*  PLT 301 298 325    COAGULATION  Recent Labs Lab 08/18/15 0832  INR 1.12    CARDIAC  No results for input(s): TROPONINI in the last 168 hours. No results for input(s): PROBNP in the last 168 hours.   CHEMISTRY  Recent Labs Lab 08/18/15 [id number] 08/18/15 0840 08/18/15 1253 08/19/15 0430 08/20/15 0340 08/21/15 0546 08/22/15 0520  NA 137 140 138 138 139 141 139  K 4.0 4.0 4.0 3.5 3.8 3.7 3.8  CL 104 103  --  106 108 110 106  CO2 24  --   --  22 22 23 25   GLUCOSE 132* 130*  --  144* 114* 106* 111*  BUN 18 22*  --  15 27* 21* 18  CREATININE 0.89 0.80  --  0.80 0.91 0.62 0.62  CALCIUM 10.0  --   --  9.1 8.7* 8.6* 8.5*  MG  --   --   --   --  1.8 1.9 2.1  PHOS  --   --   --   --  2.5 2.0* 3.0   Estimated Creatinine Clearance: 85.8 mL/min (by C-G formula based on Cr of 0.62).   LIVER  Recent Labs Lab 08/18/15 0832  AST 36  ALT 26  ALKPHOS 85  BILITOT 0.7  PROT 8.4*  ALBUMIN 3.8  INR 1.12     INFECTIOUS No results for input(s): LATICACIDVEN, PROCALCITON in the last 168 hours.   ENDOCRINE CBG (last 3)   Recent Labs  08/21/15 2046 08/21/15 2341 08/22/15 0356  GLUCAP 108* 105* 93         IMAGING x48h  - image(s) personally visualized  -   highlighted in bold Dg Chest Port 1 View  08/22/2015  CLINICAL DATA:  Followup intubated patient. EXAM: PORTABLE CHEST 1 VIEW COMPARISON:  08/21/2015 FINDINGS: Lungs essentially clear.  No pleural effusion or pneumothorax. Cardiac silhouette is normal in size. Left anterior chest wall biventricular cardioverter-defibrillator is stable. Endotracheal tube tip projects 4.4 cm above the  Carina, stable. Nasal/orogastric tube passes below the diaphragm well into the stomach. Right internal jugular central venous line tip projects in the lower superior vena cava, also unchanged. IMPRESSION: 1. No acute cardiopulmonary disease. 2. Stable support apparatus. Electronically Signed   By: Lajean Manes M.D.   On: 08/22/2015 07:20   Dg Chest Port 1 View  08/21/2015  CLINICAL DATA:  Endotracheal tube placement.  Ventilator dependence. EXAM: PORTABLE CHEST 1 VIEW COMPARISON:  08/20/2015 FINDINGS: 0618 hours. Endotracheal tube tip is 5.0 cm above the base of the carina. The NG tube passes into the stomach although the distal tip position is  not included on the film. Right IJ central line tip overlies the mid SVC. Left sided permanent pacer/ AICD remains in place. Telemetry leads overlie the chest. Stable asymmetric elevation right hemidiaphragm. Slight interval increase in vascular congestion without overt edema. No focal airspace consolidation or pleural effusion. IMPRESSION: Slight increase in vascular congestion.  Otherwise stable exam. Electronically Signed   By: Misty Stanley M.D.   On: 08/21/2015 07:59        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  Acute encephalopathy  H/o spinal cord  Schwannoma   S/p resection of tumor at admit 08/18/2015  - now RASS -3 on diprivan but off diprivan -  P:   Imaging per neuro/neuro-surg anticonvulsants Serial neuro-checks Await biopsy result 08/18/15 - could be 08/26/15 before results arrive  PULMONARY A: Ventilator dependence s/p ICH and seizure   - doing sbt. Started SBT P:   Extubate if meets criteria  Full vent support - otherwise Aim for normal Pco2 F/u abg PAD protocol  CARDIOVASCULAR A:  NICM w/ EF 35-40% H/o LBBB  - not on pressors P:  Cont tele BP goal <160 Hold home antihypertensives Will use PRN labetolol as needed  RENAL A:   No acute   P:   Avoid  hypotension Renal dose meds Keep even vol status   GASTROINTESTINAL A:   No acute  P:    TF PPI  HEMATOLOGIC A:   No acute (see neuro re: ICH) P:  Hold all blood thinning agents PAS to LEs Trend CBC coags acceptable and no contraindication to surgery   INFECTIOUS A:   NO acute  P:   Trend CBC and fever curve  ENDOCRINE A:   Hypothyroidism  Mild hyperglycemia   P:   Ck TSH Cont synthroid  ssi      FAMILY  - Updates: wife anne updated at bedside 08/19/15 with pastor and RN present.  Updated wife 08/21/15 and other family. None at bedside 08/22/15  - Inter-disciplinary family meet or Palliative Care meeting due by:  4/25. Discussed with Dr Joya Salm at bedside. Updated family sons, sister, wife Webb Silversmith and Marlowe Kays RN at bedside 08/20/15      The patient is critically ill with multiple organ systems failure and requires high complexity decision making for assessment and support, frequent evaluation and titration of therapies, application of advanced monitoring technologies and extensive interpretation of multiple databases.   Critical Care Time devoted to patient care services described in this note is  30  Minutes. This time reflects time of care of this signee Dr Brand Males. This critical care time does not reflect procedure time, or teaching time or supervisory time of PA/NP/Med student/Med Resident etc but could involve care discussion time    Dr. Brand Males, M.D., [facility].C.P Pulmonary and Critical Care Medicine Staff Physician [place] [facility] Pager: [phone], If no answer or between  15:00h - 7:00h: call [phone]  08/22/2015 8:27 AM

## 2015-08-23 ENCOUNTER — Inpatient Hospital Stay (HOSPITAL_COMMUNITY): Payer: BC Managed Care – PPO

## 2015-08-23 DIAGNOSIS — J9601 Acute respiratory failure with hypoxia: Secondary | ICD-10-CM

## 2015-08-23 DIAGNOSIS — N3000 Acute cystitis without hematuria: Secondary | ICD-10-CM

## 2015-08-23 DIAGNOSIS — I10 Essential (primary) hypertension: Secondary | ICD-10-CM

## 2015-08-23 LAB — CBC WITH DIFFERENTIAL/PLATELET
Basophils Absolute: 0 10*3/uL (ref 0.0–0.1)
Basophils Relative: 0 %
Eosinophils Absolute: 0.1 10*3/uL (ref 0.0–0.7)
Eosinophils Relative: 1 %
HCT: 28.7 % — ABNORMAL LOW (ref 36.0–46.0)
HEMOGLOBIN: 9.3 g/dL — AB (ref 12.0–15.0)
LYMPHS ABS: 2.1 10*3/uL (ref 0.7–4.0)
LYMPHS PCT: 11 %
MCH: 32.5 pg (ref 26.0–34.0)
MCHC: 32.4 g/dL (ref 30.0–36.0)
MCV: 100.3 fL — AB (ref 78.0–100.0)
Monocytes Absolute: 1.9 10*3/uL — ABNORMAL HIGH (ref 0.1–1.0)
Monocytes Relative: 10 %
NEUTROS PCT: 78 %
Neutro Abs: 15 10*3/uL — ABNORMAL HIGH (ref 1.7–7.7)
Platelets: 370 10*3/uL (ref 150–400)
RBC: 2.86 MIL/uL — AB (ref 3.87–5.11)
RDW: 13.3 % (ref 11.5–15.5)
WBC: 19.1 10*3/uL — ABNORMAL HIGH (ref 4.0–10.5)

## 2015-08-23 LAB — BASIC METABOLIC PANEL
Anion gap: 6 (ref 5–15)
BUN: 22 mg/dL — AB (ref 6–20)
CHLORIDE: 106 mmol/L (ref 101–111)
CO2: 26 mmol/L (ref 22–32)
Calcium: 8.8 mg/dL — ABNORMAL LOW (ref 8.9–10.3)
Creatinine, Ser: 0.59 mg/dL (ref 0.44–1.00)
GFR calc Af Amer: >60 mL/min (ref >60)
GFR calc non Af Amer: >60 mL/min (ref >60)
GLUCOSE: 121 mg/dL — AB (ref 65–99)
POTASSIUM: 3.5 mmol/L (ref 3.5–5.1)
Sodium: 138 mmol/L (ref 135–145)

## 2015-08-23 LAB — MAGNESIUM: MAGNESIUM: 1.9 mg/dL (ref 1.7–2.4)

## 2015-08-23 LAB — GLUCOSE, CAPILLARY
GLUCOSE-CAPILLARY: 89 mg/dL (ref 65–99)
GLUCOSE-CAPILLARY: 113 mg/dL — AB (ref 65–99)
GLUCOSE-CAPILLARY: 98 mg/dL (ref 65–99)
GLUCOSE-CAPILLARY: 107 mg/dL — AB (ref 65–99)
Glucose-Capillary: 88 mg/dL (ref 65–99)
Glucose-Capillary: 111 mg/dL — ABNORMAL HIGH (ref 65–99)

## 2015-08-23 LAB — PHOSPHORUS: Phosphorus: 3.2 mg/dL (ref 2.5–4.6)

## 2015-08-23 LAB — PROCALCITONIN: PROCALCITONIN: 0.17 ng/mL

## 2015-08-23 LAB — LACTIC ACID, PLASMA: Lactic Acid, Venous: 1.2 mmol/L (ref 0.5–2.0)

## 2015-08-23 MED ORDER — POTASSIUM CHLORIDE 20 MEQ/15ML (10%) PO SOLN
20.0000 meq | ORAL | Status: AC
Start: 2015-08-23 — End: 2015-08-23
  Administered 2015-08-23: 20 meq
  Filled 2015-08-23: qty 15

## 2015-08-23 MED ORDER — FENTANYL CITRATE (PF) 100 MCG/2ML IJ SOLN
25.0000 ug | INTRAMUSCULAR | Status: DC | PRN
  Administered 2015-08-23 – 2015-08-26 (×3): 100 ug via INTRAVENOUS
  Filled 2015-08-23 (×4): qty 2

## 2015-08-23 MED ORDER — VANCOMYCIN HCL IN DEXTROSE 1-5 GM/200ML-% IV SOLN
1000.0000 mg | Freq: Three times a day (TID) | INTRAVENOUS | Status: DC
  Administered 2015-08-23 – 2015-08-25 (×7): 1000 mg via INTRAVENOUS
  Filled 2015-08-23 (×8): qty 200

## 2015-08-23 MED ORDER — POTASSIUM CHLORIDE 20 MEQ/15ML (10%) PO SOLN
40.0000 meq | Freq: Once | ORAL | Status: AC
  Administered 2015-08-23: 40 meq
  Filled 2015-08-23: qty 30

## 2015-08-23 MED ORDER — PIPERACILLIN-TAZOBACTAM 3.375 G IVPB
3.3750 g | Freq: Three times a day (TID) | INTRAVENOUS | Status: DC
  Administered 2015-08-23 – 2015-08-28 (×15): 3.375 g via INTRAVENOUS
  Filled 2015-08-23 (×17): qty 50

## 2015-08-23 NOTE — Progress Notes (Signed)
[facility] ADULT ICU REPLACEMENT PROTOCOL FOR AM LAB REPLACEMENT ONLY  The patient does apply for the [facility] Adult ICU Electrolyte Replacment Protocol based on the criteria listed below:   1. Is GFR >/= 40 ml/min? Yes.    Patient's GFR today is >60 2. Is urine output >/= 0.5 ml/kg/hr for the last 6 hours? Yes.   Patient's UOP is 0.8 ml/kg/hr 3. Is BUN < 60 mg/dL? Yes.    Patient's BUN today is 22 4. Abnormal electrolyte(s):K3.5 5. Ordered repletion with: per protocol 6. If a panic level lab has been reported, has the CCM MD in charge been notified? Yes.  .   Physician:  Patricia Nettle, MD  [facility] 08/23/2015 5:10 AM

## 2015-08-23 NOTE — Progress Notes (Signed)
Dr Arnoldo Morale notified that patient seems less restless then yesterday with LUE and does not open eyes as frequently when she is laid down to be turned. Pt still moving LUE spontaneously and localizes. The IVC has not drained any fluid since 0800 when is was raised to 20cm of H2O and was pulsating. He stated to leave drain at 20 to get an accurate assessment of ventrical size with adjusted height. Will continue to monitor.

## 2015-08-23 NOTE — Progress Notes (Signed)
Dr Emmit Alexanders notified of positive blood culture results in the aerobic bottle showing gram positve cocci in clusters. He stated she is adequately covered with the vanc and no changes needed to be made.

## 2015-08-23 NOTE — Progress Notes (Signed)
Pharmacy Antibiotic Note  Sylvia Moore is a 65 y.o. female admitted on 08/18/2015 with [place] now with persistent fevers, pyuria.  Pharmacy has been consulted for vancomycin and zosyn dosing.  Plan: Vancomycin 1 gm IV every 8 hours.  Goal trough 15-20 mcg/mL. Zosyn 3.375g IV q8h (4 hour infusion).  Height: 5\' 8"  (172.7 cm) Weight: 214 lb 1.1 oz (97.1 kg) IBW/kg (Calculated) : 63.9  Temp (24hrs), Avg:100.8 F (38.2 C), Min:100 F (37.8 C), Max:101.7 F (38.7 C)   Recent Labs Lab 08/19/15 0430 08/20/15 0340 08/21/15 0546 08/22/15 0520 08/23/15 0200  WBC 23.6* 20.7* 20.8* 17.8* 19.1*  CREATININE 0.80 0.91 0.62 0.62 0.59    Estimated Creatinine Clearance: 86.6 mL/min (by C-G formula based on Cr of 0.59).    Allergies  Allergen Reactions  . Codeine Nausea And Vomiting    Antimicrobials this admission: Vancomycin 4/23>> Zosyn 4/23>>  Dose adjustments this admission: None  Microbiology results: 4/22 Blood x2 4/22 TA 4/23 Urine MRSA PCR Neg  Levester Fresh, PharmD, BCPS, [facility] Clinical Pharmacist Pager [phone] 08/23/2015 9:39 AM

## 2015-08-23 NOTE — Progress Notes (Signed)
STROKE TEAM PROGRESS NOTE   SUBJECTIVE (INTERVAL HISTORY) Her wife is at the bedside.  Patient remains intubated but off sedation. EVD level raised up by Dr. Jacklynn Bue. will repeat CT in am. No recurrent seizures. Left side spontaneous movement. Overnight some agitation, was put back on propofol, will d/c and put on fentanyl PRN. Neuro stable   OBJECTIVE Temp:  [100 F (37.8 C)-101.7 F (38.7 C)] 100.2 F (37.9 C) (04/23 0600) Pulse Rate:  [92-122] 92 (04/23 0600) Cardiac Rhythm:  [-] Sinus tachycardia (04/22 2000) Resp:  [17-34] 18 (04/23 0600) BP: (91-153)/(52-71) 118/54 mmHg (04/23 0600) SpO2:  [99 %-100 %] 100 % (04/23 0600) FiO2 (%):  [30 %] 30 % (04/23 0321) Weight:  [97.1 kg (214 lb 1.1 oz)] 97.1 kg (214 lb 1.1 oz) (04/23 0300)  CBC:   Recent Labs Lab 08/22/15 0520 08/23/15 0200  WBC 17.8* 19.1*  NEUTROABS 13.5* 15.0*  HGB 9.7* 9.3*  HCT 29.9* 28.7*  MCV 101.7* 100.3*  PLT 325 0000000    Basic Metabolic Panel:   Recent Labs Lab 08/22/15 0520 08/23/15 0200  NA 139 138  K 3.8 3.5  CL 106 106  CO2 25 26  GLUCOSE 111* 121*  BUN 18 22*  CREATININE 0.62 0.59  CALCIUM 8.5* 8.8*  MG 2.1 1.9  PHOS 3.0 3.2    Lipid Panel:     Component Value Date/Time   CHOL 164 06/19/2014 0057   TRIG 129 08/21/2015 0913   HDL 42 06/19/2014 0057   CHOLHDL 3.9 06/19/2014 0057   VLDL 19 06/19/2014 0057   LDLCALC 103* 06/19/2014 0057   HgbA1c:  Lab Results  Component Value Date   HGBA1C 5.6 06/18/2014   Urine Drug Screen:     Component Value Date/Time   LABOPIA NONE DETECTED 08/18/2015 1044   COCAINSCRNUR NONE DETECTED 08/18/2015 1044   LABBENZ NONE DETECTED 08/18/2015 1044   AMPHETMU NONE DETECTED 08/18/2015 1044   THCU NONE DETECTED 08/18/2015 1044   LABBARB NONE DETECTED 08/18/2015 1044      IMAGING I have personally reviewed the radiological images below and agree with the radiology interpretations.  Ct Head Wo Contrast 08/19/2015  Interval LEFT frontotemporal  craniotomy for evacuation of temporal lobe hematoma and subdural hematoma. Moderate residual LEFT subarachnoid hemorrhage. Trace LEFT tentorial subdural hematoma. Improved, residual 3 mm LEFT to RIGHT midline shift.  08/18/2015   ~ 3cm left frontal parenchymal hematoma with subarachnoid and subdural extension. There may be an underlying mass and CT with and without contrast is recommended. 6 mm midline shift.  08/18/2015  Large area of hemorrhage in the left hemisphere has progressed since 1 hour ago. There is parenchymal hemorrhage with active extravasation in the left frontal operculum. There is also increase in left hemispheric subdural hemorrhage. There is a small amount of subarachnoid hemorrhage in the left sylvian fissure. Increase in low-density edema on the left frontal lobe. Increase in midline shift which is now 11 mm to the right   Ct Angio Head & Neck W/cm &/or Wo/cm 08/18/2015   Negative for vascular malformation. No aneurysm or AVM is seen to account for the large left frontal temporal hematoma. Large hematoma left frontal temporal lobe is stable since the recent CT scan. Extravasated contrast shows improvement since the prior study. Mild amount of subarachnoid hemorrhage. 6 mm left subdural hematoma is stable. There is increased edema in the left hemisphere with increased mass-effect on the lateral ventricle. 11 mm midline shift is unchanged.   Ct Chest Abdomen  Pelvis W Contrast 08/18/2015   1. No evidence of primary malignancy or metastatic disease in the chest or abdomen. 2. 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 3. Low volume chest with bilateral atelectasis. 4. Chronic findings are stable   Dg Chest Resurgens [facility] 08/19/2015   Increased left basilar subsegmental atelectasis. Stable support apparatus.  08/18/2015  1. New central line in unremarkable position.  No pneumothorax. 2. Low volume chest with basilar atelectasis.  08/18/2015   Bibasilar  atelectasis. Tube positions as above.   2D echo - pending  CT head - pending   PHYSICAL EXAM Middle-aged Caucasian lady intubated on sedation. She has postcraniotomy head bandage.Afebrile. Head is nontraumatic. Neck is supple without bruit. Cardiac exam no murmur or gallop. Lungs are clear to auscultation. Distal pulses are well felt. Head dressing. Left eye periorbitall swelling post craniotomy.  Neurological Exam  Patient is on propofol for sedation. She does not follow any commands. Eyes briefly opened on voice. Pupils are both 2 mm reactive. Corneal reflexes are present. Fundi cannot be visualized. Left eye difficult to open. She does not grimace to pain. Spontaneous extremity movements on left upper and lower extremity. Does not move the right side to painful stimuli except slight movement in the right leg. Purposeful withdrawal on the left leg and arm pain. Right plantar upgoing left downgoing.  ASSESSMENT/PLAN Sylvia Moore is a 65 y.o. female with history of CHF, schwannoma of spinal cord, orthostatic HTN, GERD, L BBB, Hepatitis and Hodgkin's lymphoma presenting with altered mental status and dysarthria.   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 IVC placed during OR. Suspect hemorrhage secondary to underlying tumor. Pathology pending.   Resultant Right hemiplegia    Neurosurgery consulted Joya Salm) 4/19 crani with IVC placed, some "abnormal tissue" removed, ? Tumor. Path pending   CT abdomen/chest/pelvis no evidence of metastatic disease. 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 L frontal lobe ICH w/ SAH, L SDH. Mass effect with 75mm midline shift  Repeat CT head in am  TTE pending EF 35-40% in 01/2015  EEG On hold due to scalp dressing and IVC  SCDs for VTE prophylaxis Diet NPO time  specified  No antithrombotic prior to admission, no antithrombotic now due to [place]  Ongoing aggressive stroke risk factor management  Therapy recommendations:  pending   Disposition:  pending (has a 67 year-old daughter with mental retardation, unable to make decisions)  If source of hemorrhage is tumor, type of tumor will dictate prognosis.  Patient's neurologic condition post extubation will also help guide plans  Respiratory failure  Intubated in the ED following seizure from [place]  Still on vent but off sedation  Extubate when able  CCM on board  Seizure  Left greater than right body involvement  On Keppra 1.5g q 12h  If EEG neg for seizure, will decrease dose  EEG pending  Fever   Temp better today  WBC 20.8->17.8->19.1  Blood culture pending   UA TNTC   Sputum culture pending  Pt is on zosyn and vanco now  Hypertension  Stable now  Other Stroke Risk Factors  Former Cigarette smoker, quit smoking 47 years ago   ETOH use  Marijuana use  Obesity, Body mass index is 32.56 kg/(m^2).   Family  hx stroke (father)  Chronic systolic CHF - EF 123456 in 01/2015  Other Active Problems  History of Hodgkin's lymphoma  History of hepatitis, type unknown  History of spinal cord schwannoma  Hypothyroidism  Hospital day # 5  This patient is critically ill due to left frontal ICH with mass effect s/p evacuation and EVD, suspicious for brain mass, fever and at significant risk of neurological worsening, death form CNS infection, recurrent bleeding, cerebral edema and brain herniation. This patient's care requires constant monitoring of vital signs, hemodynamics, respiratory and cardiac monitoring, review of multiple databases, neurological assessment, discussion with family, other specialists and medical decision making of high complexity. I spent 35 minutes of neurocritical care time in the care of this patient.   Rosalin Hawking, MD PhD Stroke  Neurology 08/23/2015 8:31 AM     To contact Stroke Continuity provider, please refer to [email or web address] After hours, contact General Neurology

## 2015-08-23 NOTE — Progress Notes (Signed)
Patient ID: Sylvia Moore, female   DOB: 01-23-1951, 65 y.o.   MRN: [id number] Subjective:  The patient remains intubated and sedated. She is in no apparent distress.  Objective: Vital signs in last 24 hours: Temp:  [100 F (37.8 C)-101.7 F (38.7 C)] 100.2 F (37.9 C) (04/23 0600) Pulse Rate:  [92-122] 92 (04/23 0600) Resp:  [17-34] 18 (04/23 0600) BP: (91-153)/(52-71) 118/54 mmHg (04/23 0600) SpO2:  [99 %-100 %] 100 % (04/23 0600) FiO2 (%):  [30 %] 30 % (04/23 0321) Weight:  [97.1 kg (214 lb 1.1 oz)] 97.1 kg (214 lb 1.1 oz) (04/23 0300)  Intake/Output from previous day: 04/22 0701 - 04/23 0700 In: 2534.7 [I.V.:1824.7; NG/GT:480; IV Piggyback:230] Out: 2085 [Urine:1950; Drains:135] Intake/Output this shift:    Physical exam patient is Glasgow Coma Scale 7 intubated [id number], she localizes on the left. She is right hemiplegic. Her ventriculostomy is patent.  Lab Results:  Recent Labs  08/22/15 0520 08/23/15 0200  WBC 17.8* 19.1*  HGB 9.7* 9.3*  HCT 29.9* 28.7*  PLT 325 370   BMET  Recent Labs  08/22/15 0520 08/23/15 0200  NA 139 138  K 3.8 3.5  CL 106 106  CO2 25 26  GLUCOSE 111* 121*  BUN 18 22*  CREATININE 0.62 0.59  CALCIUM 8.5* 8.8*    Studies/Results: Dg Chest Port 1 View  08/23/2015  CLINICAL DATA:  Endotracheal tube.  Shortness of breath. EXAM: PORTABLE CHEST 1 VIEW COMPARISON:  08/22/2015 FINDINGS: Endotracheal tube tip measures 4.7 cm above the carina. Enteric tube tip is off the field of view but below the left hemidiaphragm. Right central venous catheter with tip over the low SVC region. Cardiac pacemaker. No pneumothorax. Shallow inspiration. Heart size and pulmonary vascularity are normal. Mild atelectasis in the lung bases. No focal consolidation. Postoperative changes in the lumbar spine. IMPRESSION: Appliances are in satisfactory position. Shallow inspiration with atelectasis in the lung bases. Electronically Signed   By: Lucienne Capers M.D.   On:  08/23/2015 05:03   Dg Chest Port 1 View  08/22/2015  CLINICAL DATA:  Followup intubated patient. EXAM: PORTABLE CHEST 1 VIEW COMPARISON:  08/21/2015 FINDINGS: Lungs essentially clear.  No pleural effusion or pneumothorax. Cardiac silhouette is normal in size. Left anterior chest wall biventricular cardioverter-defibrillator is stable. Endotracheal tube tip projects 4.4 cm above the Carina, stable. Nasal/orogastric tube passes below the diaphragm well into the stomach. Right internal jugular central venous line tip projects in the lower superior vena cava, also unchanged. IMPRESSION: 1. No acute cardiopulmonary disease. 2. Stable support apparatus. Electronically Signed   By: Lajean Manes M.D.   On: 08/22/2015 07:20    Assessment/Plan: Postop day #4: I will raise the patient's ventriculostomy to 20 cm. We will plan to repeat her CAT scan tomorrow. We will continue supportive care and awaiting pathology.   LOS: 5 days     Yazmen Briones D 08/23/2015, 8:42 AM

## 2015-08-23 NOTE — Progress Notes (Signed)
PULMONARY / CRITICAL CARE MEDICINE   Name: Sylvia Moore MRN: [id number] DOB: [birth year]    ADMISSION DATE:  08/18/2015 CONSULTATION DATE:  4/18  REFERRING MD:  Sylvia Moore  CHIEF COMPLAINT:  ICH    BRIEF 65 yof c/o headache the am of 4/18-->followed by aphasia and then somnolence. Had witnessed seizure activity by staff while in CT scan. CT showed large left ICH w/ midline shift. Admitted to [facility] service, w/ plan for probable left Crani pending results of CT angio.   She  has a past medical history of Chronic systolic heart failure ([place]); Depression; Gout; HOH (hard of hearing); Schwannoma of spinal cord ([place]); Orthostatic hypotension; NICM (nonischemic cardiomyopathy) (H[facility]); GERD (gastroesophageal reflux disease); LBBB (left bundle branch block); Pneumonia (1960's X 1; 2014); Hypothyroidism; History of blood transfusion (~ 1976); Hepatitis (~ 1978); and Hodgkin's lymphoma ([place])    STUDIES and EVENTS 4/18  - 08/18/2015 - admit and intubated 4/18:~ 3cm left frontal parenchymal hematoma with subarachnoid and subdural extension. 4/18 CT chest/abd/pelvis: 1. No evidence of primary malignancy or metastatic disease in the chest or abdomen. 2. 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 3. Low volume chest with bilateral atelectasis. 4/18 Repeat CT head: Large area of hemorrhage in the left hemisphere has progressed since 1 hour ago. There is parenchymal hemorrhage with active extravasation in the left frontal operculum. There is also increase in left hemispheric subdural hemorrhage. There is a small amount of subarachnoid hemorrhage in the left sylvian fissure. Increase in low-density edema on the left frontal lobe. Increase in midline shift which is now 11 mm to the right 4/18 CT angio of brain >> EEG 4/18>>>  4./18 - Dr Joya Salm "Left frontotemporal craniotomy, evacuation of the subdural hematoma, evacuation of intercerebral hematoma plus resection  of abnormal tissue most likely tumor."  08/19/15 - Deeply sedated with diprivan gtt 46mcg. Per RN - neurosurgery does not want WUA . Wife by her side and pastor - both appreciative. PEr RN only flicker movement on right side  08/20/15 - on TF. Path pending. Dr Joya Salm ok with extubation if patient meets criteria. Per RN - off diprivan - patient not awake. Flicker on right. Left side with spont non-purposeful mvoement and cough +. Doing SBT   08/21/15 - Per Dr Joya Salm - path will be several days to result - [facility] to [facility]. She has improved movement on left and more arousable. Opens eyes. But cough and gag - very weak  08/22/15 - ongoing fevers low grade despite tylenol. Getting ICE prn. STronger cough on suctioning today but WUA and SBT just started   SUBJECTIVE/OVERNIGHT/INTERVAL HX 08/23/15 -off dipriban x 1h - not responsive. Less than 48h ago but per RN and neurosurg similar to past 24h. Neurosurg has CT oprdered for 08/24/15. Ongoing fevers +. Pyuria +   Not on pressors  VITAL SIGNS: BP 120/56 mmHg  Pulse 107  Temp(Src) 100.2 F (37.9 C) (Core (Comment))  Resp 17  Ht 5\' 8"  (1.727 m)  Wt 97.1 kg (214 lb 1.1 oz)  BMI 32.56 kg/m2  SpO2 100%  HEMODYNAMICS:    VENTILATOR SETTINGS: Vent Mode:  [-] PRVC FiO2 (%):  [30 %] 30 % Set Rate:  [18 bmp] 18 bmp Vt Set:  [500 mL] 500 mL PEEP:  [5 cmH20] 5 cmH20 Plateau Pressure:  [17 cmH20-18 cmH20] 17 cmH20  INTAKE / OUTPUT: I/O last 3 completed shifts: In: 3722.5 [I.V.:2772.5; NG/GT:720; IV Piggyback:230] Out: 2803 [Urine:2615; Drains:188]  PHYSICAL  EXAMINATION: General:  Sedated 65 year old white female, currently on full vent support Neuro:  RASS -3 off diprivan. Left side with mittens on hand. Weak gag +.  HEENT:  PRRL, orally intubated. No JVD. Left eyelid area swollen. SCalp left side with sutures Cardiovascular:  rrr w/out MRG Lungs:  CTA and sync with vent Abdomen:  Soft, + bowel sounds No OM Musculoskeletal:  Equal st and  bulk Skin:  Warm and dry   LABS:  PULMONARY  Recent Labs Lab 08/18/15 0840 08/18/15 1008 08/18/15 1253 08/19/15 0355  PHART  --  7.447 7.427 7.489*  PCO2ART  --  43.0 39.4 30.6*  PO2ART  --  376.0* 290.0* 101*  HCO3  --  29.7* 26.3* 23.0  TCO2 25 31 28  23.9  O2SAT  --  100.0 100.0 95.8    CBC  Recent Labs Lab 08/21/15 0546 08/22/15 0520 08/23/15 0200  HGB 10.2* 9.7* 9.3*  HCT 31.1* 29.9* 28.7*  WBC 20.8* 17.8* 19.1*  PLT 298 325 370    COAGULATION  Recent Labs Lab 08/18/15 0832  INR 1.12    CARDIAC  No results for input(s): TROPONINI in the last 168 hours. No results for input(s): PROBNP in the last 168 hours.   CHEMISTRY  Recent Labs Lab 08/19/15 0430 08/20/15 0340 08/21/15 0546 08/22/15 0520 08/23/15 0200  NA 138 139 141 139 138  K 3.5 3.8 3.7 3.8 3.5  CL 106 108 110 106 106  CO2 22 22 23 25 26   GLUCOSE 144* 114* 106* 111* 121*  BUN 15 27* 21* 18 22*  CREATININE 0.80 0.91 0.62 0.62 0.59  CALCIUM 9.1 8.7* 8.6* 8.5* 8.8*  MG  --  1.8 1.9 2.1 1.9  PHOS  --  2.5 2.0* 3.0 3.2   Estimated Creatinine Clearance: 86.6 mL/min (by C-G formula based on Cr of 0.59).   LIVER  Recent Labs Lab 08/18/15 0832  AST 36  ALT 26  ALKPHOS 85  BILITOT 0.7  PROT 8.4*  ALBUMIN 3.8  INR 1.12     INFECTIOUS No results for input(s): LATICACIDVEN, PROCALCITON in the last 168 hours.   ENDOCRINE CBG (last 3)   Recent Labs  08/22/15 2311 08/23/15 0327 08/23/15 0746  GLUCAP 119* 88 89         IMAGING x48h  - image(s) personally visualized  -   highlighted in bold Dg Chest Port 1 View  08/23/2015  CLINICAL DATA:  Endotracheal tube.  Shortness of breath. EXAM: PORTABLE CHEST 1 VIEW COMPARISON:  08/22/2015 FINDINGS: Endotracheal tube tip measures 4.7 cm above the carina. Enteric tube tip is off the field of view but below the left hemidiaphragm. Right central venous catheter with tip over the low SVC region. Cardiac pacemaker. No pneumothorax.  Shallow inspiration. Heart size and pulmonary vascularity are normal. Mild atelectasis in the lung bases. No focal consolidation. Postoperative changes in the lumbar spine. IMPRESSION: Appliances are in satisfactory position. Shallow inspiration with atelectasis in the lung bases. Electronically Signed   By: Lucienne Capers M.D.   On: 08/23/2015 05:03   Dg Chest Port 1 View  08/22/2015  CLINICAL DATA:  Followup intubated patient. EXAM: PORTABLE CHEST 1 VIEW COMPARISON:  08/21/2015 FINDINGS: Lungs essentially clear.  No pleural effusion or pneumothorax. Cardiac silhouette is normal in size. Left anterior chest wall biventricular cardioverter-defibrillator is stable. Endotracheal tube tip projects 4.4 cm above the Carina, stable. Nasal/orogastric tube passes below the diaphragm well into the stomach. Right internal jugular central venous line  tip projects in the lower superior vena cava, also unchanged. IMPRESSION: 1. No acute cardiopulmonary disease. 2. Stable support apparatus. Electronically Signed   By: Lajean Manes M.D.   On: 08/22/2015 07:20        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  Acute encephalopathy  H/o spinal cord  Schwannoma   S/p resection of tumor at admit 08/18/2015  - now RASS -3  off diprivan - some decline could be due to fevers  P:   DC diprivan Imaging per neuro/neuro-surg - ordered 08/24/15 (d/w Dr Arnoldo Morale) anticonvulsants Serial neuro-checks Await biopsy result 08/18/15 - could be 08/26/15 before results arrive ([facility])  PULMONARY A: Ventilator dependence s/p ICH and seizure   - doing sbt but does not meet extubation criteria P:   SBT - NO Extubation  Full vent support - otherwise DC diprivan Do fent PRN - PAD protocol  CARDIOVASCULAR A:  NICM w/ EF 35-40% H/o LBBB  - not on pressors P:  Cont tele BP goal <160 Hold home antihypertensives Will use PRN labetolol as needed  RENAL A:    Mild hypokalemia  P:   Avoid hypotension Renal dose meds Keep even vol status   GASTROINTESTINAL A:   No acute  P:    TF PPI  HEMATOLOGIC A:   No acute (see neuro re: ICH) P:  Hold all blood thinning agents PAS to LEs Trend CBC coags acceptable and no contraindication to surgery   INFECTIOUS A:   Ongoing fevers +. Pyruia +   P:   Check sepsis biomarkers 08/23/15 Check urine culture, blood culture and trach aspirate 08/23/15 Start empirc vanc and zosyn 08/03/15 Trend CBC and fever curve  ENDOCRINE A:   Hypothyroidism  Mild hyperglycemia   P:   Ck TSH Cont synthroid  ssi      FAMILY  - Updates: wife anne updated at bedside 08/19/15 with pastor and RN present.  Updated wife 08/21/15 and other family. None at bedside 08/22/15. Updated Webb Silversmith 08/23/15 at bedside.   - Inter-disciplinary family meet or Palliative Care meeting due by:  4/25. Discussed with Dr Joya Salm at bedside. Updated family sons, sister, wife Webb Silversmith and Marlowe Kays RN at bedside 08/20/15      The patient is critically ill with multiple organ systems failure and requires high complexity decision making for assessment and support, frequent evaluation and titration of therapies, application of advanced monitoring technologies and extensive interpretation of multiple databases.   Critical Care Time devoted to patient care services described in this note is  30  Minutes. This time reflects time of care of this signee Dr Brand Males. This critical care time does not reflect procedure time, or teaching time or supervisory time of PA/NP/Med student/Med Resident etc but could involve care discussion time    Dr. Brand Males, M.D., [facility].C.P Pulmonary and Critical Care Medicine Staff Physician [place] [facility] Pager: [phone], If no answer or between  15:00h - 7:00h: call [phone]  08/23/2015 9:27 AM

## 2015-08-23 NOTE — Progress Notes (Signed)
eLink Physician-Brief Progress Note Patient Name: Sylvia Moore DOB: January 28, 1951 MRN: [id number]   Date of Service  08/23/2015  HPI/Events of Note  Blood cultures positive for GPC in clusters (1/2 bottles). No ID or Sensitivities are available at this time.  Patient is currently on Vancomycin and Zosyn. Vancomycin should cover the GPC's in clusters.   eICU Interventions  Continue current management.      Intervention Category Major Interventions: Infection - evaluation and management  Anna-Marie Coller [place] 08/23/2015, 3:28 PM

## 2015-08-23 NOTE — Progress Notes (Signed)
Patient ID: Sylvia Moore, female   DOB: Apr 06, 1951, 65 y.o.   MRN: [id number] Stable moves left side well.  open her eyes.  Moves leff side well and right foot to pain. IVC to 20 cm h20. To get ct head in am.

## 2015-08-24 ENCOUNTER — Inpatient Hospital Stay (HOSPITAL_COMMUNITY): Payer: BC Managed Care – PPO

## 2015-08-24 DIAGNOSIS — E785 Hyperlipidemia, unspecified: Secondary | ICD-10-CM

## 2015-08-24 DIAGNOSIS — I611 Nontraumatic intracerebral hemorrhage in hemisphere, cortical: Secondary | ICD-10-CM

## 2015-08-24 DIAGNOSIS — J988 Other specified respiratory disorders: Secondary | ICD-10-CM

## 2015-08-24 DIAGNOSIS — I6789 Other cerebrovascular disease: Secondary | ICD-10-CM

## 2015-08-24 DIAGNOSIS — I5022 Chronic systolic (congestive) heart failure: Secondary | ICD-10-CM

## 2015-08-24 DIAGNOSIS — I63412 Cerebral infarction due to embolism of left middle cerebral artery: Secondary | ICD-10-CM

## 2015-08-24 LAB — CBC WITH DIFFERENTIAL/PLATELET
BASOS ABS: 0 10*3/uL (ref 0.0–0.1)
Basophils Relative: 0 %
EOS ABS: 0.2 10*3/uL (ref 0.0–0.7)
Eosinophils Relative: 1 %
HCT: 27.5 % — ABNORMAL LOW (ref 36.0–46.0)
Hemoglobin: 9 g/dL — ABNORMAL LOW (ref 12.0–15.0)
LYMPHS ABS: 2.5 10*3/uL (ref 0.7–4.0)
Lymphocytes Relative: 13 %
MCH: 32.8 pg (ref 26.0–34.0)
MCHC: 32.7 g/dL (ref 30.0–36.0)
MCV: 100.4 fL — ABNORMAL HIGH (ref 78.0–100.0)
Monocytes Absolute: 1.7 10*3/uL — ABNORMAL HIGH (ref 0.1–1.0)
Monocytes Relative: 9 %
NEUTROS ABS: 14.5 10*3/uL — AB (ref 1.7–7.7)
Neutrophils Relative %: 77 %
PLATELETS: 393 10*3/uL (ref 150–400)
RBC: 2.74 MIL/uL — AB (ref 3.87–5.11)
RDW: 13.2 % (ref 11.5–15.5)
WBC: 18.9 10*3/uL — AB (ref 4.0–10.5)

## 2015-08-24 LAB — BASIC METABOLIC PANEL
Anion gap: 9 (ref 5–15)
BUN: 21 mg/dL — AB (ref 6–20)
CO2: 24 mmol/L (ref 22–32)
CREATININE: 0.57 mg/dL (ref 0.44–1.00)
Calcium: 8.8 mg/dL — ABNORMAL LOW (ref 8.9–10.3)
Chloride: 104 mmol/L (ref 101–111)
Glucose, Bld: 126 mg/dL — ABNORMAL HIGH (ref 65–99)
POTASSIUM: 4 mmol/L (ref 3.5–5.1)
SODIUM: 137 mmol/L (ref 135–145)

## 2015-08-24 LAB — CK TOTAL AND CKMB (NOT AT ARMC)
CK, MB: 1.3 ng/mL (ref 0.5–5.0)
Relative Index: INVALID (ref 0.0–2.5)
Total CK: 31 U/L — ABNORMAL LOW (ref 38–234)

## 2015-08-24 LAB — PROCALCITONIN: PROCALCITONIN: 0.21 ng/mL

## 2015-08-24 LAB — GLUCOSE, CAPILLARY
GLUCOSE-CAPILLARY: 97 mg/dL (ref 65–99)
GLUCOSE-CAPILLARY: 87 mg/dL (ref 65–99)
GLUCOSE-CAPILLARY: 99 mg/dL (ref 65–99)
GLUCOSE-CAPILLARY: 91 mg/dL (ref 65–99)
GLUCOSE-CAPILLARY: 117 mg/dL — AB (ref 65–99)
Glucose-Capillary: 93 mg/dL (ref 65–99)

## 2015-08-24 LAB — PHOSPHORUS: PHOSPHORUS: 3.4 mg/dL (ref 2.5–4.6)

## 2015-08-24 LAB — MAGNESIUM: MAGNESIUM: 1.9 mg/dL (ref 1.7–2.4)

## 2015-08-24 LAB — ECHOCARDIOGRAM COMPLETE
Height: 68 in
Weight: 3283.97 oz

## 2015-08-24 LAB — VANCOMYCIN, TROUGH: VANCOMYCIN TR: 14 ug/mL (ref 10.0–20.0)

## 2015-08-24 MED ORDER — HEPARIN SODIUM (PORCINE) 5000 UNIT/ML IJ SOLN
5000.0000 [IU] | Freq: Three times a day (TID) | INTRAMUSCULAR | Status: DC
  Administered 2015-08-24 – 2015-08-31 (×20): 5000 [IU] via SUBCUTANEOUS
  Filled 2015-08-24 (×19): qty 1

## 2015-08-24 MED ORDER — CARVEDILOL 6.25 MG PO TABS: 6.2500 mg | ORAL_TABLET | Freq: Two times a day (BID) | ORAL | Status: DC

## 2015-08-24 MED ORDER — ACETAMINOPHEN 650 MG RE SUPP: 650.0000 mg | RECTAL | Status: DC | PRN

## 2015-08-24 MED ORDER — LEVOTHYROXINE SODIUM 25 MCG PO TABS
125.0000 ug | ORAL_TABLET | Freq: Every day | ORAL | Status: DC
  Administered 2015-08-25 – 2015-08-31 (×7): 125 ug
  Filled 2015-08-24 (×7): qty 1

## 2015-08-24 MED ORDER — PANTOPRAZOLE SODIUM 40 MG PO PACK
40.0000 mg | PACK | ORAL | Status: DC
  Administered 2015-08-24 – 2015-08-26 (×3): 40 mg
  Filled 2015-08-24 (×3): qty 20

## 2015-08-24 MED ORDER — CARVEDILOL 6.25 MG PO TABS
6.2500 mg | ORAL_TABLET | Freq: Two times a day (BID) | ORAL | Status: DC
  Administered 2015-08-24 – 2015-08-31 (×14): 6.25 mg
  Filled 2015-08-24: qty 1
  Filled 2015-08-24 (×3): qty 2
  Filled 2015-08-24 (×2): qty 1
  Filled 2015-08-24 (×2): qty 2
  Filled 2015-08-24: qty 1
  Filled 2015-08-24: qty 2
  Filled 2015-08-24 (×5): qty 1
  Filled 2015-08-24: qty 2
  Filled 2015-08-24: qty 1
  Filled 2015-08-24: qty 2
  Filled 2015-08-24 (×2): qty 1
  Filled 2015-08-24: qty 2
  Filled 2015-08-24 (×3): qty 1
  Filled 2015-08-24 (×2): qty 2

## 2015-08-24 MED ORDER — ACETAMINOPHEN 160 MG/5ML PO SOLN
650.0000 mg | Freq: Four times a day (QID) | ORAL | Status: DC | PRN
  Administered 2015-08-24 – 2015-08-27 (×3): 650 mg via ORAL
  Filled 2015-08-24 (×3): qty 20.3

## 2015-08-24 NOTE — Progress Notes (Signed)
  Echocardiogram 2D Echocardiogram has been performed.  Darlina Sicilian M 08/24/2015, 10:48 AM

## 2015-08-24 NOTE — Progress Notes (Signed)
STROKE TEAM PROGRESS NOTE   SUBJECTIVE (INTERVAL HISTORY) Her wife and minister are at the bedside.  Patient remains intubated but off sedation. More awake and open eyes. EVD essentially no drainage after level raised up yesterday. Repeat CT more consistent with left MCA infarct with hemorrhagic transformation. No recurrent seizures. Left side spontaneous movement.   OBJECTIVE Temp:  [100 F (37.8 C)-101.5 F (38.6 C)] 101.3 F (38.5 C) (04/24 1300) Pulse Rate:  [89-123] 105 (04/24 1300) Cardiac Rhythm:  [-] Normal sinus rhythm (04/24 0800) Resp:  [16-35] 35 (04/24 1300) BP: (76-146)/(51-78) 134/65 mmHg (04/24 1300) SpO2:  [100 %] 100 % (04/24 1300) FiO2 (%):  [30 %] 30 % (04/24 1150) Weight:  [205 lb 4 oz (93.1 kg)] 205 lb 4 oz (93.1 kg) (04/24 0200)  CBC:   Recent Labs Lab 08/23/15 0200 08/24/15 0520  WBC 19.1* 18.9*  NEUTROABS 15.0* 14.5*  HGB 9.3* 9.0*  HCT 28.7* 27.5*  MCV 100.3* 100.4*  PLT 370 [id number]    Basic Metabolic Panel:   Recent Labs Lab 08/23/15 0200 08/24/15 0520  NA 138 137  K 3.5 4.0  CL 106 104  CO2 26 24  GLUCOSE 121* 126*  BUN 22* 21*  CREATININE 0.59 0.57  CALCIUM 8.8* 8.8*  MG 1.9 1.9  PHOS 3.2 3.4    Lipid Panel:     Component Value Date/Time   CHOL 164 06/19/2014 0057   TRIG 129 08/21/2015 0913   HDL 42 06/19/2014 0057   CHOLHDL 3.9 06/19/2014 0057   VLDL 19 06/19/2014 0057   LDLCALC 103* 06/19/2014 0057   HgbA1c:  Lab Results  Component Value Date   HGBA1C 5.6 06/18/2014   Urine Drug Screen:     Component Value Date/Time   LABOPIA NONE DETECTED 08/18/2015 1044   COCAINSCRNUR NONE DETECTED 08/18/2015 1044   LABBENZ NONE DETECTED 08/18/2015 1044   AMPHETMU NONE DETECTED 08/18/2015 1044   THCU NONE DETECTED 08/18/2015 1044   LABBARB NONE DETECTED 08/18/2015 1044      IMAGING I have personally reviewed the radiological images below and agree with the radiology interpretations.  Ct Head Wo Contrast 08/24/2015 IMPRESSION:  1. Stable moderate subarachnoid hemorrhage on the left. 2. Left MCA territory infarct. Cytotoxic edema results in increased midline shift, now 7 mm. 3. EVD with no ventriculomegaly.  08/19/2015  Interval LEFT frontotemporal craniotomy for evacuation of temporal lobe hematoma and subdural hematoma. Moderate residual LEFT subarachnoid hemorrhage. Trace LEFT tentorial subdural hematoma. Improved, residual 3 mm LEFT to RIGHT midline shift.  08/18/2015   ~ 3cm left frontal parenchymal hematoma with subarachnoid and subdural extension. There may be an underlying mass and CT with and without contrast is recommended. 6 mm midline shift.  08/18/2015  Large area of hemorrhage in the left hemisphere has progressed since 1 hour ago. There is parenchymal hemorrhage with active extravasation in the left frontal operculum. There is also increase in left hemispheric subdural hemorrhage. There is a small amount of subarachnoid hemorrhage in the left sylvian fissure. Increase in low-density edema on the left frontal lobe. Increase in midline shift which is now 11 mm to the right   Ct Angio Head & Neck W/cm &/or Wo/cm 08/18/2015   Negative for vascular malformation. No aneurysm or AVM is seen to account for the large left frontal temporal hematoma. Large hematoma left frontal temporal lobe is stable since the recent CT scan. Extravasated contrast shows improvement since the prior study. Mild amount of subarachnoid hemorrhage. 6 mm left subdural  hematoma is stable. There is increased edema in the left hemisphere with increased mass-effect on the lateral ventricle. 11 mm midline shift is unchanged.   Ct Chest Abdomen Pelvis W Contrast 08/18/2015   1. No evidence of primary malignancy or metastatic disease in the chest or abdomen. 2. 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 3. Low volume chest with bilateral atelectasis. 4. Chronic findings are stable   Dg Chest [facility] 08/19/2015    Increased left basilar subsegmental atelectasis. Stable support apparatus.  08/18/2015  1. New central line in unremarkable position.  No pneumothorax. 2. Low volume chest with basilar atelectasis.  08/18/2015   Bibasilar atelectasis. Tube positions as above.   2D echo - - Left ventricle: The cavity size was mildly dilated. Wall  thickness was normal. Systolic function was moderately to  severely reduced. The estimated ejection fraction was in the  range of 30% to 35%. Diffuse hypokinesis. Doppler parameters are  consistent with restrictive physiology, indicative of decreased  left ventricular diastolic compliance and/or increased left  atrial pressure. - Aortic valve: There was trivial regurgitation. - Mitral valve: There was moderate regurgitation. - Pulmonary arteries: Systolic pressure was moderately increased.  PA peak pressure: 54 mm Hg (S). Impressions: - Moderate to severe global reduction in LV function; restrictive  LV filling; sclerotic aortic valve with trace AI; moderate MR;  mild TR with moderately elevated pulmonary pressure; possible  patent foramen ovale noted on subcostal images.   PHYSICAL EXAM Middle-aged Caucasian lady intubated on sedation. She has postcraniotomy head bandage. Intermittent fever. Head is nontraumatic. Neck is supple without bruit. Cardiac exam no murmur or gallop. Lungs are clear to auscultation. Distal pulses are well felt. Head dressing. Left eye periorbitall swelling post craniotomy.  Neurological Exam  Patient is off sedation. Eyes opened and kept open but does not follow any commands. Pupils are both 2 mm reactive. Corneal reflexes are present. Fundi cannot be visualized. She does not grimace to pain. Spontaneous extremity movements on left upper and lower extremity. Does not move the right side to painful stimuli except slight movement in the right leg. Purposeful withdrawal on the left leg and arm pain. Right plantar upgoing left  downgoing.  ASSESSMENT/PLAN Ms. ANTIQUA AFOLABI is a 65 y.o. female with history of CHF, schwannoma of spinal cord, orthostatic HTN, GERD, L BBB, Hepatitis and Hodgkin's lymphoma presenting with altered mental status and dysarthria.   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 IVC placed during OR. Suspect hemorrhage secondary to right MCA infarct vs. underlying tumor. Pathology pending.   Resultant Right hemiplegia    Neurosurgery consulted Joya Salm) 4/19 crani with IVC placed, ? Tumor. Path pending   CT abdomen/chest/pelvis no evidence of metastatic disease. 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 L frontal lobe ICH w/ SAH, L SDH. Mass effect with 64mm midline shift  Repeat CT head - decreased midline shift now 74mm, but pattern more consistent with left MCA infarct with hemorrhagic transformation  TTE 30-35% down from EF 35-40% in 01/2015  EEG On hold due to scalp dressing and IVC  Need pacemaker interrogation to rule out afib  LDL pending  A1C pending  subq heparin for VTE prophylaxis Diet NPO time specified  No antithrombotic prior to admission, no antithrombotic now  due to [place]  Ongoing aggressive stroke risk factor management  Therapy recommendations:  pending   Disposition:  pending (has a 39 year-old daughter with mental retardation, unable to make decisions)  If source of hemorrhage is tumor, type of tumor will dictate prognosis.  Patient's neurologic condition post extubation will also help guide plans  Chronic CHF  Has been following with Dr. Rayann Heman in clinic  Pacemaker in situ  EF 35-40% in 01/2015  Now EF down to 30-35%  CXR no pulmonary edema  Respiratory failure  Intubated in the ED following seizure from [place]  Still on vent but off sedation  Extubate when  able  CCM on board  Seizure  Left greater than right body involvement  On Keppra 1.5g q 12h  If EEG neg for seizure, will decrease dose  EEG pending  Fever   WBC 20.8->17.8->19.1->18.9  Blood culture NGTD   UA TNTC   Urine Cx GNR  Sputum culture pending  Pt is on zosyn and vanco now  Hypertension  Stable now  Hyperlipidemia  No statin at home  LDL pending, goal < 70  Other Stroke Risk Factors  Former Cigarette smoker, quit smoking 47 years ago   ETOH use  Marijuana use  Obesity, Body mass index is 31.22 kg/(m^2).   Family hx stroke (father)  Other Active Problems  History of Hodgkin's lymphoma  History of hepatitis, type unknown  History of spinal cord schwannoma  Hypothyroidism  Hospital day # 6  This patient is critically ill due to left frontal ICH with mass effect s/p evacuation and EVD, suspicious for brain mass, fever and at significant risk of neurological worsening, death form CNS infection, recurrent bleeding, cerebral edema and brain herniation. This patient's care requires constant monitoring of vital signs, hemodynamics, respiratory and cardiac monitoring, review of multiple databases, neurological assessment, discussion with family, other specialists and medical decision making of high complexity. I spent 40 minutes of neurocritical care time in the care of this patient.   Rosalin Hawking, MD PhD Stroke Neurology 08/24/2015 2:53 PM     To contact Stroke Continuity provider, please refer to [email or web address] After hours, contact General Neurology

## 2015-08-24 NOTE — Procedures (Signed)
Pt transported from 3M10 to CT then back to room without complications.

## 2015-08-24 NOTE — Progress Notes (Signed)
Patient ID: Sylvia Moore, female   DOB: 07/27/1950, 65 y.o.   MRN: [id number] Stable, not f/c but moves the left side well. ivc at 20  Now at 5. Ct head mca cva. Some shift. Plan to be extubated by ccm, spoke with family

## 2015-08-24 NOTE — Progress Notes (Signed)
PULMONARY / CRITICAL CARE MEDICINE   Name: Sylvia Moore MRN: [id number] DOB: [birth year]    ADMISSION DATE:  08/18/2015  REFERRING MD:  ER  CHIEF COMPLAINT:  Headache  SUBJECTIVE:  Tolerating pressure support.  VITAL SIGNS: BP 124/59 mmHg  Pulse 97  Temp(Src) 100.4 F (38 C) (Core (Comment))  Resp 22  Ht 5\' 8"  (1.727 m)  Wt 205 lb 4 oz (93.1 kg)  BMI 31.22 kg/m2  SpO2 100%  VENTILATOR SETTINGS: Vent Mode:  [-] PSV;CPAP FiO2 (%):  [30 %] 30 % Set Rate:  [18 bmp] 18 bmp Vt Set:  [500 mL] 500 mL PEEP:  [5 cmH20] 5 cmH20 Pressure Support:  [10 cmH20] 10 cmH20 Plateau Pressure:  [17 cmH20-18 cmH20] 18 cmH20  INTAKE / OUTPUT: I/O last 3 completed shifts: In: 5462.1 [I.V.:2722.1; Other:925; NG/GT:720; IV [id number] Out: 2197 [Urine:2130; Drains:67]  PHYSICAL EXAMINATION: General: on vent Neuro:  Moves Lt side HEENT:  ETT in place Cardiovascular:  Regular, tachycardic Lungs:  B/l rhonchi Abdomen:  Soft, non tender Musculoskeletal:  No edema Skin:  No rashes  LABS:  BMET  Recent Labs Lab 08/22/15 0520 08/23/15 0200 08/24/15 0520  NA 139 138 137  K 3.8 3.5 4.0  CL 106 106 104  CO2 25 26 24   BUN 18 22* 21*  CREATININE 0.62 0.59 0.57  GLUCOSE 111* 121* 126*    Electrolytes  Recent Labs Lab 08/22/15 0520 08/23/15 0200 08/24/15 0520  CALCIUM 8.5* 8.8* 8.8*  MG 2.1 1.9 1.9  PHOS 3.0 3.2 3.4    CBC  Recent Labs Lab 08/22/15 0520 08/23/15 0200 08/24/15 0520  WBC 17.8* 19.1* 18.9*  HGB 9.7* 9.3* 9.0*  HCT 29.9* 28.7* 27.5*  PLT 325 370 393    Coag's  Recent Labs Lab 08/18/15 0832  APTT 30  INR 1.12    Sepsis Markers  Recent Labs Lab 08/23/15 1005 08/24/15 0520  LATICACIDVEN 1.2  --   PROCALCITON 0.17 0.21    ABG  Recent Labs Lab 08/18/15 1008 08/18/15 1253 08/19/15 0355  PHART 7.447 7.427 7.489*  PCO2ART 43.0 39.4 30.6*  PO2ART 376.0* 290.0* 101*    Liver Enzymes  Recent Labs Lab 08/18/15 0832  AST 36   ALT 26  ALKPHOS 85  BILITOT 0.7  ALBUMIN 3.8    Cardiac Enzymes No results for input(s): TROPONINI, PROBNP in the last 168 hours.  Glucose  Recent Labs Lab 08/23/15 1337 08/23/15 1655 08/23/15 1920 08/23/15 2320 08/24/15 0326 08/24/15 0752  GLUCAP 113* 98 107* 111* 97 87    Imaging Ct Head Wo Contrast  08/24/2015  CLINICAL DATA:  Intracranial hemorrhage.  Follow-up. EXAM: CT HEAD WITHOUT CONTRAST TECHNIQUE: Contiguous axial images were obtained from the base of the skull through the vertex without intravenous contrast. COMPARISON:  08/19/2015 FINDINGS: Unremarkable left craniotomy. Swelling in the overlying scalp with fluid and hemorrhagic components, cannot exclude CSF leakage. Right frontal EVD with tip near the left caudal thalamic groove. Residual subarachnoid hemorrhage on the left mainly around the operative site is stable. There is well-defined cortical and subcortical low-density in the left MCA territory, posterior frontal and superior temporal, consistent with infarct. The deep gray nuclei are spared and there is sparing along the upper motor strip. With increased cytotoxic edema midline shift has increased to 8 mm at the septum pellucidum. No overt hydrocephalus. No new site of hemorrhage. IMPRESSION: 1. Stable moderate subarachnoid hemorrhage on the left. 2. Left MCA territory infarct. Cytotoxic edema results in increased midline shift,  now 7 mm. 3. EVD with no ventriculomegaly. Electronically Signed   By: Monte Fantasia M.D.   On: 08/24/2015 04:09   Dg Chest Port 1 View  08/24/2015  CLINICAL DATA:  ET tube in place EXAM: PORTABLE CHEST 1 VIEW COMPARISON:  08/23/2015 FINDINGS: Left pacer, endotracheal tube and right central line remain in place, unchanged. NG tube tip in the proximal stomach. Mild perihilar and bibasilar opacities could reflect atelectasis or edema. Heart is borderline in size. No visible significant effusions or acute bony abnormality. IMPRESSION: Perihilar  and bibasilar opacities which could represent atelectasis or edema. No real change since prior study. Electronically Signed   By: Rolm Baptise M.D.   On: 08/24/2015 07:34     STUDIES:  4/18 CT head >> 3 cm Lt frontal hematoma with SAH and SDH, 6 mm shift 4/24 CT head >> Lt MCA infarct, cytotoxic edema, 7 mm shift 4/24 Echo >>  CULTURES: 4/22 Blood >> Coag neg Staph  4/22 Sputum >> GPC, GNR >>  4/23 Urine >> 4/23 Blood >>  ANTIBIOTICS: 4/23 Vancomycin >> 4/23 Zosyn >>   SIGNIFICANT EVENTS: 4/18 Admit, neurology/neurosurgery consulted; Lt frontotemporal craniotomy with evacuation of SDH and ICH with biopsy 4/23 Fever (101.5) >> start Abx  LINES/TUBES: 4/18 ETT >> 4/18 Rt IJ CVL >> 4/18 IVD >>   DISCUSSION: 65 yo female former smoker with vomiting, seizure, slurred speech and altered mental status from Lt ICH.  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  ASSESSMENT / PLAN:  PULMONARY A: Compromised airway in setting of ICH. 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 Hx of LBBB, HTN, HLD. P:  Goal SBP < 160, prn labetalol Resume outpt coreg 4/24 Hold outpt lasix, diovan, corlanor for now F/u Echo  RENAL A:   Hypokalemia. P:   Monitor renal fx, urine outpt Replace electrolytes as needed  GASTROINTESTINAL A:   Nutrition. P:   Tube feeds while on vent Protonix for SUP  HEMATOLOGIC A:   Anemia of critical illness. Leukocytosis. P:  F/u CBC SCDs for DVT prevention  INFECTIOUS A:   Fever with abnormal U/A, GPC/GNR in sputum Coag neg Staph in 1 blood culture >> likely contaminate. P:   Day 2 of vancomycin, zosyn  F/u culture results  ENDOCRINE A:   Hx of hypothyroidism, gout. P:   Continue synthroid Hold outpt allopurinol  NEUROLOGIC A:   Lt  frontal ICH with SAH and SDH with cytotoxic edema. Seizure. P:   Monitor mental status F/u brain bx  AEDs per neurology Hold outpt effexor   Updated pt's family at bedside.  D/w Dr. Erlinda Hong.  CC time 38 minutes.  [name], MD [facility] Pulmonary/Critical Care 08/24/2015, 11:48 AM Pager:  [phone] After 3pm call: [phone]

## 2015-08-24 NOTE — Progress Notes (Signed)
Pharmacy Antibiotic Note  Sylvia Moore is a 65 y.o. female admitted on 08/18/2015 with [place] now with persistent fevers, pyuria.  Pharmacy has been consulted for vancomycin and zosyn dosing - D#4. WBC stable 18.9, Tmax 101.5, LA 1.2, PC 0.21.  VT today is ~therapeutic at 14. Will continue current dose for now. Blood cultures w/ 1/2 coag neg staph A, UC with GNR  Plan: Vancomycin 1 gm IV every 8 hours.  Goal trough 15-20 mcg/mL. Zosyn 3.375g IV q8h (4 hour infusion).  Monitor clinical progress, c/s, renal function, abx plan/LOT VT prn De-escalate soon?  Height: 5\' 8"  (172.7 cm) Weight: 205 lb 4 oz (93.1 kg) IBW/kg (Calculated) : 63.9  Temp (24hrs), Avg:100.5 F (38.1 C), Min:100 F (37.8 C), Max:101.5 F (38.6 C)   Recent Labs Lab 08/20/15 0340 08/21/15 0546 08/22/15 0520 08/23/15 0200 08/23/15 1005 08/24/15 0520 08/24/15 1209  WBC 20.7* 20.8* 17.8* 19.1*  --  18.9*  --   CREATININE 0.91 0.62 0.62 0.59  --  0.57  --   LATICACIDVEN  --   --   --   --  1.2  --   --   VANCOTROUGH  --   --   --   --   --   --  14    Estimated Creatinine Clearance: 84.8 mL/min (by C-G formula based on Cr of 0.57).    Allergies  Allergen Reactions  . Codeine Nausea And Vomiting    Antimicrobials this admission: Vancomycin 4/23>> Zosyn 4/23>>  Dose adjustments this admission: 4/24 VT: 14 on 1g q8h  Microbiology results: 4/23 resp cx: 4/23 BCx2: 4/22 Blood x2: coag neg staph A 1/2 - contaminant? 4/22 TA: 4/23 Urine: GNR >100k col 4/18 MRSA PCR neg  Elicia Lamp, PharmD, BCPS Clinical Pharmacist Pager [phone] 08/24/2015 2:11 PM

## 2015-08-25 ENCOUNTER — Inpatient Hospital Stay (HOSPITAL_COMMUNITY): Payer: BC Managed Care – PPO

## 2015-08-25 DIAGNOSIS — I82402 Acute embolism and thrombosis of unspecified deep veins of left lower extremity: Secondary | ICD-10-CM

## 2015-08-25 DIAGNOSIS — I639 Cerebral infarction, unspecified: Secondary | ICD-10-CM

## 2015-08-25 DIAGNOSIS — G936 Cerebral edema: Secondary | ICD-10-CM

## 2015-08-25 LAB — LIPID PANEL
CHOL/HDL RATIO: 5.9 ratio
CHOLESTEROL: 123 mg/dL (ref 0–200)
HDL: 21 mg/dL — ABNORMAL LOW (ref >40)
LDL Cholesterol: 81 mg/dL (ref 0–99)
TRIGLYCERIDES: 103 mg/dL (ref <150)
VLDL: 21 mg/dL (ref 0–40)

## 2015-08-25 LAB — PROCALCITONIN: Procalcitonin: 0.23 ng/mL

## 2015-08-25 LAB — BASIC METABOLIC PANEL
ANION GAP: 9 (ref 5–15)
BUN: 24 mg/dL — ABNORMAL HIGH (ref 6–20)
CALCIUM: 8.7 mg/dL — AB (ref 8.9–10.3)
CO2: 25 mmol/L (ref 22–32)
Chloride: 101 mmol/L (ref 101–111)
Creatinine, Ser: 0.62 mg/dL (ref 0.44–1.00)
GFR calc non Af Amer: >60 mL/min (ref >60)
Glucose, Bld: 103 mg/dL — ABNORMAL HIGH (ref 65–99)
POTASSIUM: 3.5 mmol/L (ref 3.5–5.1)
Sodium: 135 mmol/L (ref 135–145)

## 2015-08-25 LAB — GLUCOSE, CAPILLARY
GLUCOSE-CAPILLARY: 112 mg/dL — AB (ref 65–99)
GLUCOSE-CAPILLARY: 101 mg/dL — AB (ref 65–99)
GLUCOSE-CAPILLARY: 104 mg/dL — AB (ref 65–99)
Glucose-Capillary: 94 mg/dL (ref 65–99)
Glucose-Capillary: 86 mg/dL (ref 65–99)
Glucose-Capillary: 109 mg/dL — ABNORMAL HIGH (ref 65–99)

## 2015-08-25 LAB — CULTURE, RESPIRATORY

## 2015-08-25 LAB — CULTURE, RESPIRATORY W GRAM STAIN

## 2015-08-25 LAB — CULTURE, BLOOD (ROUTINE X 2)

## 2015-08-25 LAB — MAGNESIUM: Magnesium: 1.9 mg/dL (ref 1.7–2.4)

## 2015-08-25 LAB — PHOSPHORUS: PHOSPHORUS: 3.3 mg/dL (ref 2.5–4.6)

## 2015-08-25 MED ORDER — ATORVASTATIN CALCIUM 10 MG PO TABS
10.0000 mg | ORAL_TABLET | Freq: Every day | ORAL | Status: DC
  Administered 2015-08-25 – 2015-08-27 (×3): 10 mg via ORAL
  Filled 2015-08-25 (×3): qty 1

## 2015-08-25 MED ORDER — CHLORHEXIDINE GLUCONATE 0.12 % MT SOLN
OROMUCOSAL | Status: AC
  Filled 2015-08-25: qty 15

## 2015-08-25 NOTE — Progress Notes (Signed)
Patient ID: Sylvia Moore, female   DOB: 21-May-1950, 65 y.o.   MRN: [id number] More awake,.tracks with eyes. IVC removed

## 2015-08-25 NOTE — Care Management Note (Signed)
Case Management Note  Patient Details  Name: Sylvia Moore MRN: [id number] Date of Birth: August 19, 1950  Subjective/Objective:   Pt admitted on 08/18/15 s/p ICH s/p craniotomy with tumor removal.  PTA, pt resided at home with spouse.                  Action/Plan: Pt currently remains on ventilator.  Will follow for discharge planning as pt progresses.    Expected Discharge Date:                  Expected Discharge Plan:  Skilled Nursing Facility  In-House Referral:  Clinical Social Work  Discharge planning Services  CM Consult  Post Acute Care Choice:    Choice offered to:     DME Arranged:    DME Agency:     HH Arranged:    [place] Agency:     Status of Service:  In process, will continue to follow  Medicare Important Message Given:    Date Medicare IM Given:    Medicare IM give by:    Date Additional Medicare IM Given:    Additional Medicare Important Message give by:     If discussed at [place] of Stay Meetings, dates discussed:    Additional Comments:  Reinaldo Raddle, RN, BSN  Trauma/Neuro ICU Case Manager [phone]

## 2015-08-25 NOTE — Progress Notes (Signed)
   08/25/15 1002  Clinical Encounter Type  Visited With Patient and family together  Visit Type Follow-up;Critical Care  Spiritual Encounters  Spiritual Needs Emotional   Chaplain followed up with patient's wife and other family members and friends. Family is feeling optimistic about patient's current status (reduced swelling around the eye, moving towards the removal of drain and ventilator). Family is optimistic about pathology results as well. Chaplain offered support, and our services are available as needed.   Jeri Lager, Chaplain 08/25/2015 10:05 AM

## 2015-08-25 NOTE — Progress Notes (Signed)
PULMONARY / CRITICAL CARE MEDICINE   Name: Sylvia Moore MRN: [id number] DOB: 1950/06/12    ADMISSION DATE:  08/18/2015  REFERRING MD:  ER  CHIEF COMPLAINT:  Headache  SUBJECTIVE:  Tolerating pressure support.  VITAL SIGNS: BP 104/61 mmHg  Pulse 99  Temp(Src) 100 F (37.8 C) (Core (Comment))  Resp 22  Ht 5\' 8"  (1.727 m)  Wt 208 lb 8.9 oz (94.6 kg)  BMI 31.72 kg/m2  SpO2 100%  VENTILATOR SETTINGS: Vent Mode:  [-] CPAP;PSV FiO2 (%):  [30 %-40 %] 30 % Set Rate:  [18 bmp] 18 bmp Vt Set:  [500 mL] 500 mL PEEP:  [5 cmH20] 5 cmH20 Pressure Support:  [10 cmH20-12 cmH20] 10 cmH20 Plateau Pressure:  [18 cmH20-19 cmH20] 18 cmH20  INTAKE / OUTPUT: I/O last 3 completed shifts: In: 7090 [I.V.:2700; [id number]; NG/GT:1540; IV Piggyback:1595] Out: [id number] [Urine:1450; Drains:19]  PHYSICAL EXAMINATION: General: more alert Neuro:  Moves Lt side, opens eyes spontaneously HEENT:  ETT in place Cardiovascular:  Regular Lungs: no wheeze Abdomen:  Soft, non tender Musculoskeletal:  1+ edema Skin:  No rashes  LABS:  BMET  Recent Labs Lab 08/23/15 0200 08/24/15 0520 08/25/15 0540  NA 138 137 135  K 3.5 4.0 3.5  CL 106 104 101  CO2 26 24 25   BUN 22* 21* 24*  CREATININE 0.59 0.57 0.62  GLUCOSE 121* 126* 103*    Electrolytes  Recent Labs Lab 08/23/15 0200 08/24/15 0520 08/25/15 0540  CALCIUM 8.8* 8.8* 8.7*  MG 1.9 1.9 1.9  PHOS 3.2 3.4 3.3    CBC  Recent Labs Lab 08/22/15 0520 08/23/15 0200 08/24/15 0520  WBC 17.8* 19.1* 18.9*  HGB 9.7* 9.3* 9.0*  HCT 29.9* 28.7* 27.5*  PLT 325 370 393    Coag's  Recent Labs Lab 08/18/15 0832  APTT 30  INR 1.12    Sepsis Markers  Recent Labs Lab 08/23/15 1005 08/24/15 0520 08/25/15 0540  LATICACIDVEN 1.2  --   --   PROCALCITON 0.17 0.21 0.23    ABG  Recent Labs Lab 08/18/15 1008 08/18/15 1253 08/19/15 0355  PHART 7.447 7.427 7.489*  PCO2ART 43.0 39.4 30.6*  PO2ART 376.0* 290.0* 101*    Liver  Enzymes  Recent Labs Lab 08/18/15 0832  AST 36  ALT 26  ALKPHOS 85  BILITOT 0.7  ALBUMIN 3.8    Cardiac Enzymes No results for input(s): TROPONINI, PROBNP in the last 168 hours.  Glucose  Recent Labs Lab 08/24/15 0752 08/24/15 1117 08/24/15 1547 08/24/15 1944 08/24/15 2314 08/25/15 0319  GLUCAP 87 99 93 91 117* 94    Imaging Dg Chest Port 1 View  08/25/2015  CLINICAL DATA:  Respiratory failure. EXAM: PORTABLE CHEST 1 VIEW COMPARISON:  08/24/2015 FINDINGS: Endotracheal tube remains in place and terminates 3.5-4 cm above the carina. Enteric tube is looped in the proximal stomach. Right jugular central venous catheter terminates over the lower SVC. Cardiac silhouette is upper limits of normal in size. ICD remains in place. Lung volumes are unchanged with similar appearance of mild left greater than right basilar lung opacities. No sizable pleural effusion or pneumothorax is identified. IMPRESSION: Unchanged mild bibasilar opacities, likely atelectasis. Electronically Signed   By: Logan Bores M.D.   On: 08/25/2015 07:47     STUDIES:  4/18 CT head >> 3 cm Lt frontal hematoma with SAH and SDH, 6 mm shift 4/24 CT head >> Lt MCA infarct, cytotoxic edema, 7 mm shift 4/24 Echo >> EF 30 to 35%, diffuse  hypokinesis, mod MR, PAS 54 mmHg  CULTURES: 4/22 Blood >> Coag neg Staph  4/22 Sputum >> GPC, GNR >>  4/23 Urine >> GNR >>  4/23 Blood >>  ANTIBIOTICS: 4/23 Vancomycin >> 4/23 Zosyn >>   SIGNIFICANT EVENTS: 4/18 Admit, neurology/neurosurgery consulted; Lt frontotemporal craniotomy with evacuation of SDH and ICH with biopsy 4/23 Fever (101.5) >> start Abx  LINES/TUBES: 4/18 ETT >> 4/18 Rt IJ CVL >> 4/18 IVD >>   DISCUSSION: 65 yo female former smoker with vomiting, seizure, slurred speech and altered mental status from Lt ICH.  She has hx of NHL, non ischemic CM with systolic CHF after chemo, Depression, Gout, Schwannoma T3-T4, GERD, hypothyroidism after  XRT.  ASSESSMENT / PLAN:  PULMONARY A: Compromised airway in setting of ICH. P:   Pressure support wean >> defer extubation until mental status improved; might be getting closer to this F/u CXR  NEUROLOGIC A:   Lt frontal ICH with SAH and SDH with cytotoxic edema. Seizure. 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 Hx of LBBB, HTN, HLD. P:  Goal SBP < 160, prn labetalol Continue coreg Hold outpt lasix, diovan, corlanor for now  RENAL A:   Hypokalemia. P:   Monitor renal fx, urine outpt Replace electrolytes as needed  GASTROINTESTINAL A:   Nutrition. P:   Tube feeds while on vent Protonix for SUP  HEMATOLOGIC A:   Anemia of critical illness. Leukocytosis. P:  F/u CBC SCDs for DVT prevention  INFECTIOUS A:   Fever with abnormal U/A and GNR in urine cx, GPC/GNR in sputum cx. Coag neg Staph in 1 blood culture >> likely contaminate. P:   Day 3 of vancomycin, zosyn  F/u culture results  ENDOCRINE A:   Hx of hypothyroidism, gout. P:   Continue synthroid Hold outpt allopurinol   CC time 31 minutes.  [name], MD [facility] Pulmonary/Critical Care 08/25/2015, 8:08 AM Pager:  [phone] After 3pm call: [phone]

## 2015-08-25 NOTE — Progress Notes (Signed)
STROKE TEAM PROGRESS NOTE   SUBJECTIVE (INTERVAL HISTORY) Her wife is at the bedside.  Patient remains intubated off sedation and much more awake alert and open eyes. EVD continues on drainage. Pt still not following commands but likely due to aphasia. Plan to remove EVD today and likely extubate tomorrow. Still right hemiplegic. Found to have left DVT. Discussed with Dr. Joya Salm, we both felt anticoagulation is risky due to [place]. Will do IVC filter. Wife is in agreement.    OBJECTIVE Temp:  [99.1 F (37.3 C)-101.8 F (38.8 C)] 100 F (37.8 C) (04/25 1400) Pulse Rate:  [11-111] 91 (04/25 1400) Cardiac Rhythm:  [-] Normal sinus rhythm (04/25 1200) Resp:  [13-30] 26 (04/25 1400) BP: (91-128)/(33-70) 114/63 mmHg (04/25 1400) SpO2:  [100 %] 100 % (04/25 1400) FiO2 (%):  [30 %-40 %] 30 % (04/25 1400) Weight:  [208 lb 8.9 oz (94.6 kg)] 208 lb 8.9 oz (94.6 kg) (04/25 0500)  CBC:   Recent Labs Lab 08/23/15 0200 08/24/15 0520  WBC 19.1* 18.9*  NEUTROABS 15.0* 14.5*  HGB 9.3* 9.0*  HCT 28.7* 27.5*  MCV 100.3* 100.4*  PLT 370 [id number]    Basic Metabolic Panel:   Recent Labs Lab 08/24/15 0520 08/25/15 0540  NA 137 135  K 4.0 3.5  CL 104 101  CO2 24 25  GLUCOSE 126* 103*  BUN 21* 24*  CREATININE 0.57 0.62  CALCIUM 8.8* 8.7*  MG 1.9 1.9  PHOS 3.4 3.3    Lipid Panel:     Component Value Date/Time   CHOL 123 08/25/2015 0540   TRIG 103 08/25/2015 0540   HDL 21* 08/25/2015 0540   CHOLHDL 5.9 08/25/2015 0540   VLDL 21 08/25/2015 0540   LDLCALC 81 08/25/2015 0540   HgbA1c:  Lab Results  Component Value Date   HGBA1C 5.6 06/18/2014   Urine Drug Screen:     Component Value Date/Time   LABOPIA NONE DETECTED 08/18/2015 1044   COCAINSCRNUR NONE DETECTED 08/18/2015 1044   LABBENZ NONE DETECTED 08/18/2015 1044   AMPHETMU NONE DETECTED 08/18/2015 1044   THCU NONE DETECTED 08/18/2015 1044   LABBARB NONE DETECTED 08/18/2015 1044      IMAGING I have personally reviewed the  radiological images below and agree with the radiology interpretations.  Ct Head Wo Contrast 08/24/2015 IMPRESSION: 1. Stable moderate subarachnoid hemorrhage on the left. 2. Left MCA territory infarct. Cytotoxic edema results in increased midline shift, now 7 mm. 3. EVD with no ventriculomegaly.  08/19/2015  Interval LEFT frontotemporal craniotomy for evacuation of temporal lobe hematoma and subdural hematoma. Moderate residual LEFT subarachnoid hemorrhage. Trace LEFT tentorial subdural hematoma. Improved, residual 3 mm LEFT to RIGHT midline shift.  08/18/2015   ~ 3cm left frontal parenchymal hematoma with subarachnoid and subdural extension. There may be an underlying mass and CT with and without contrast is recommended. 6 mm midline shift.  08/18/2015  Large area of hemorrhage in the left hemisphere has progressed since 1 hour ago. There is parenchymal hemorrhage with active extravasation in the left frontal operculum. There is also increase in left hemispheric subdural hemorrhage. There is a small amount of subarachnoid hemorrhage in the left sylvian fissure. Increase in low-density edema on the left frontal lobe. Increase in midline shift which is now 11 mm to the right   Ct Angio Head & Neck W/cm &/or Wo/cm 08/18/2015   Negative for vascular malformation. No aneurysm or AVM is seen to account for the large left frontal temporal hematoma. Large hematoma left  frontal temporal lobe is stable since the recent CT scan. Extravasated contrast shows improvement since the prior study. Mild amount of subarachnoid hemorrhage. 6 mm left subdural hematoma is stable. There is increased edema in the left hemisphere with increased mass-effect on the lateral ventricle. 11 mm midline shift is unchanged.   Ct Chest Abdomen Pelvis W Contrast 08/18/2015   1. No evidence of primary malignancy or metastatic disease in the chest or abdomen. 2. 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 3. Low volume chest with bilateral atelectasis. 4. Chronic findings are stable   Dg Chest [facility] 08/19/2015   Increased left basilar subsegmental atelectasis. Stable support apparatus.  08/18/2015  1. New central line in unremarkable position.  No pneumothorax. 2. Low volume chest with basilar atelectasis.  08/18/2015   Bibasilar atelectasis. Tube positions as above.   2D echo - - Left ventricle: The cavity size was mildly dilated. Wall  thickness was normal. Systolic function was moderately to  severely reduced. The estimated ejection fraction was in the  range of 30% to 35%. Diffuse hypokinesis. Doppler parameters are  consistent with restrictive physiology, indicative of decreased  left ventricular diastolic compliance and/or increased left  atrial pressure. - Aortic valve: There was trivial regurgitation. - Mitral valve: There was moderate regurgitation. - Pulmonary arteries: Systolic pressure was moderately increased.  PA peak pressure: 54 mm Hg (S). Impressions: - Moderate to severe global reduction in LV function; restrictive  LV filling; sclerotic aortic valve with trace AI; moderate MR;  mild TR with moderately elevated pulmonary pressure; possible  patent foramen ovale noted on subcostal images.  LE venous doppler - DVT noted in the left soleal vein. No DVT RLE.   PHYSICAL EXAM Middle-aged Caucasian lady intubated on sedation. She has postcraniotomy head bandage. Intermittent fever. Head is nontraumatic. Neck is supple without bruit. Cardiac exam no murmur or gallop. Lungs are clear to auscultation. Distal pulses are well felt. Head dressing. Left eye periorbitall swelling post craniotomy.  Neurological Exam  Patient is off sedation. Eyes opened and kept open but does not follow any commands. Pupils are both 2 mm reactive. Corneal reflexes are present. Fundi cannot be visualized. She does not grimace to pain. Spontaneous extremity movements on left upper  and lower extremity. Does not move the right side to painful stimuli except slight movement in the right leg. Purposeful withdrawal on the left leg and arm pain. Right plantar upgoing left downgoing.  ASSESSMENT/PLAN Ms. KAICEE ARENCIBIA is a 65 y.o. female with history of CHF, schwannoma of spinal cord, orthostatic HTN, GERD, L BBB, Hepatitis and Hodgkin's lymphoma presenting with altered mental status and dysarthria.   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 IVC placed during OR. Suspect hemorrhage secondary to right MCA infarct vs. underlying tumor. Pathology pending.   Resultant Right hemiplegia    Neurosurgery consulted Joya Salm) 4/19 crani with IVC placed, ? Tumor. Path pending   CT abdomen/chest/pelvis no evidence of metastatic disease. 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 L frontal lobe ICH w/ SAH, L SDH. Mass effect with 87mm midline shift  Repeat CT head - decreased midline shift now 51mm, but pattern more consistent with left MCA infarct with hemorrhagic transformation  TTE 30-35% down from EF 35-40% in 01/2015  EEG On hold due to scalp dressing and IVC  Need pacemaker interrogation to rule out afib  LDL 81  A1C pending  subq heparin for VTE prophylaxis Diet NPO time specified  No antithrombotic prior to admission, no antithrombotic now due to [place]  Ongoing aggressive stroke risk factor management  Therapy recommendations:  pending   Disposition:  pending (has a 39 year-old daughter with mental retardation, unable to make decisions)  If source of hemorrhage is tumor, type of tumor will dictate prognosis.  Patient's neurologic condition post extubation will also help guide plans  Chronic CHF  Has been following with Dr. Rayann Heman in clinic  Pacemaker in situ  EF 35-40% in  01/2015  Now EF down to 30-35%  CXR no pulmonary edema  Left LE DVT  Found on LE venous doppler  High risk for anticoagulation due to [place]  Discussed with Dr. Joya Salm  Will do IVC filter  Wife is in agreement  Respiratory failure  Intubated in the ED following seizure from [place]  Still on vent but off sedation  Extubate when able  CCM on board  Seizure  Left greater than right body involvement  On Keppra 1.5g q 12h  If EEG neg for seizure, will decrease dose  EEG pending  Fever   WBC 20.8->17.8->19.1->18.9  Blood culture NGTD   UA TNTC   Urine Cx E.Coli  Sputum culture GPC/GNR in sputum  Pt is on zosyn now  Hypertension  Stable now  Hyperlipidemia  No statin at home  LDL 81, goal < 70  Add lipitor 10mg    Other Stroke Risk Factors  Former Cigarette smoker, quit smoking 47 years ago   ETOH use  Marijuana use  Obesity, Body mass index is 31.72 kg/(m^2).   Family hx stroke (father)  Other Active Problems  History of Hodgkin's lymphoma  History of hepatitis, type unknown  History of spinal cord schwannoma  Hypothyroidism  Hospital day # 7  This patient is critically ill due to left frontal ICH with mass effect s/p evacuation and EVD, suspicious for brain mass, left DVT, fever and at significant risk of neurological worsening, death form CNS infection, recurrent bleeding, cerebral edema and brain herniation as well as PE. This patient's care requires constant monitoring of vital signs, hemodynamics, respiratory and cardiac monitoring, review of multiple databases, neurological assessment, discussion with family, other specialists and medical decision making of high complexity. I spent 50 minutes of neurocritical care time in the care of this patient.   Rosalin Hawking, MD PhD Stroke Neurology 08/25/2015 3:41 PM     To contact Stroke Continuity provider, please refer to [email or web address] After hours, contact General Neurology

## 2015-08-25 NOTE — Clinical Documentation Improvement (Signed)
Critical Care  Abnormal Lab/Test Results:  Urine culture >100,000 e coli  Possible Clinical Conditions associated with below indicators  UTI  Other Condition  Cannot Clinically Determine   Supporting Information: Fever started on 08/23/15, Urine culture positive for e coli. Started on antibiotics.    Please exercise your independent, professional judgment when responding. A specific answer is not anticipated or expected.   Thank You,  [place] [phone]

## 2015-08-25 NOTE — Progress Notes (Signed)
Patient currently hospitalized and no ICM transmission received as scheduled for today.  Rescheduled remote ICM transmission on 09/15/2015.

## 2015-08-25 NOTE — Clinical Documentation Improvement (Signed)
Critical Care  Abnormal Lab/Test Results:  GPC/GNR in sputum  Possible Clinical Conditions associated with below indicators  Pneumonia  Other Condition  Cannot Clinically Determine   Supporting Information: CXR: Perihilar and bibasilar opacities which could represent atelectasis or edema. No real change since prior study.  Temp 101.2   Treatment Provided: Started on antibiotics.   Please exercise your independent, professional judgment when responding. A specific answer is not anticipated or expected.   Thank You,  [place] [phone]

## 2015-08-25 NOTE — Progress Notes (Signed)
*  PRELIMINARY RESULTS* Vascular Ultrasound Carotid Duplex (Doppler) has been completed.  Preliminary findings: DVT noted in the left soleal vein. No DVT RLE.  Gave results to Duson, RN    Landry Mellow, RDMS, RVT  08/25/2015, 12:44 PM

## 2015-08-26 ENCOUNTER — Inpatient Hospital Stay (HOSPITAL_COMMUNITY): Payer: BC Managed Care – PPO

## 2015-08-26 DIAGNOSIS — R569 Unspecified convulsions: Secondary | ICD-10-CM

## 2015-08-26 LAB — GLUCOSE, CAPILLARY
GLUCOSE-CAPILLARY: 97 mg/dL (ref 65–99)
GLUCOSE-CAPILLARY: 90 mg/dL (ref 65–99)
Glucose-Capillary: 102 mg/dL — ABNORMAL HIGH (ref 65–99)
Glucose-Capillary: 76 mg/dL (ref 65–99)
Glucose-Capillary: 106 mg/dL — ABNORMAL HIGH (ref 65–99)

## 2015-08-26 LAB — URINE CULTURE
Culture: >=100000 — AB
SPECIAL REQUESTS: NORMAL

## 2015-08-26 LAB — CBC
HCT: 23.5 % — ABNORMAL LOW (ref 36.0–46.0)
Hemoglobin: 7.7 g/dL — ABNORMAL LOW (ref 12.0–15.0)
MCH: 32.9 pg (ref 26.0–34.0)
MCHC: 32.8 g/dL (ref 30.0–36.0)
MCV: 100.4 fL — AB (ref 78.0–100.0)
PLATELETS: 548 10*3/uL — AB (ref 150–400)
RBC: 2.34 MIL/uL — AB (ref 3.87–5.11)
RDW: 13.3 % (ref 11.5–15.5)
WBC: 13.2 10*3/uL — AB (ref 4.0–10.5)

## 2015-08-26 LAB — HEMOGLOBIN AND HEMATOCRIT, BLOOD
HCT: 23.7 % — ABNORMAL LOW (ref 36.0–46.0)
Hemoglobin: 8 g/dL — ABNORMAL LOW (ref 12.0–15.0)

## 2015-08-26 LAB — BASIC METABOLIC PANEL
ANION GAP: 8 (ref 5–15)
BUN: 25 mg/dL — ABNORMAL HIGH (ref 6–20)
CALCIUM: 8.8 mg/dL — AB (ref 8.9–10.3)
CHLORIDE: 103 mmol/L (ref 101–111)
CO2: 27 mmol/L (ref 22–32)
CREATININE: 0.66 mg/dL (ref 0.44–1.00)
GFR calc non Af Amer: >60 mL/min (ref >60)
Glucose, Bld: 110 mg/dL — ABNORMAL HIGH (ref 65–99)
Potassium: 3.2 mmol/L — ABNORMAL LOW (ref 3.5–5.1)
SODIUM: 138 mmol/L (ref 135–145)

## 2015-08-26 LAB — HEMOGLOBIN A1C
Hgb A1c MFr Bld: 5.7 % — ABNORMAL HIGH (ref 4.8–5.6)
MEAN PLASMA GLUCOSE: 117 mg/dL

## 2015-08-26 MED ORDER — IOPAMIDOL (ISOVUE-300) INJECTION 61%
INTRAVENOUS | Status: AC
  Administered 2015-08-26: 30 mL
  Filled 2015-08-26: qty 100

## 2015-08-26 MED ORDER — MIDAZOLAM HCL 2 MG/2ML IJ SOLN
INTRAMUSCULAR | Status: AC
  Filled 2015-08-26: qty 4

## 2015-08-26 MED ORDER — LIDOCAINE HCL 1 % IJ SOLN
INTRAMUSCULAR | Status: AC
  Filled 2015-08-26: qty 20

## 2015-08-26 MED ORDER — MIDAZOLAM HCL 2 MG/2ML IJ SOLN
INTRAMUSCULAR | Status: AC | PRN
  Administered 2015-08-26: 1 mg via INTRAVENOUS

## 2015-08-26 MED ORDER — FENTANYL CITRATE (PF) 100 MCG/2ML IJ SOLN
INTRAMUSCULAR | Status: AC
Start: 2015-08-26 — End: 2015-08-26
  Filled 2015-08-26: qty 2

## 2015-08-26 MED ORDER — FENTANYL CITRATE (PF) 100 MCG/2ML IJ SOLN
INTRAMUSCULAR | Status: AC | PRN
  Administered 2015-08-26: 50 ug via INTRAVENOUS

## 2015-08-26 NOTE — Progress Notes (Signed)
PULMONARY / CRITICAL CARE MEDICINE   Name: Sylvia Moore MRN: [id number] DOB: 03-10-1951    ADMISSION DATE:  08/18/2015  REFERRING MD:  ER  CHIEF COMPLAINT:  Headache  SUBJECTIVE:  Tolerating pressure support.  VITAL SIGNS: BP 111/55 mmHg  Pulse 110  Temp(Src) 99.9 F (37.7 C) (Core (Comment))  Resp 24  Ht 5\' 8"  (1.727 m)  Wt 211 lb 6.7 oz (95.9 kg)  BMI 32.15 kg/m2  SpO2 100%  VENTILATOR SETTINGS: Vent Mode:  [-] CPAP;PSV FiO2 (%):  [30 %] 30 % Set Rate:  [18 bmp] 18 bmp Vt Set:  [500 mL] 500 mL PEEP:  [5 cmH20] 5 cmH20 Pressure Support:  [10 cmH20] 10 cmH20 Plateau Pressure:  [18 cmH20-21 cmH20] 18 cmH20  INTAKE / OUTPUT: I/O last 3 completed shifts: In: 4760 [I.V.:2135; NG/GT:1430; IV [id number] Out: 2132 [Urine:2115; Drains:17]  PHYSICAL EXAMINATION: General: more alert Neuro:  Moves Lt side, opens eyes spontaneously HEENT:  ETT in place Cardiovascular:  Regular Lungs: no wheeze Abdomen:  Soft, non tender Musculoskeletal:  1+ edema Skin:  No rashes  LABS:  BMET  Recent Labs Lab 08/24/15 0520 08/25/15 0540 08/26/15 0500  NA 137 135 138  K 4.0 3.5 3.2*  CL 104 101 103  CO2 24 25 27   BUN 21* 24* 25*  CREATININE 0.57 0.62 0.66  GLUCOSE 126* 103* 110*    Electrolytes  Recent Labs Lab 08/23/15 0200 08/24/15 0520 08/25/15 0540 08/26/15 0500  CALCIUM 8.8* 8.8* 8.7* 8.8*  MG 1.9 1.9 1.9  --   PHOS 3.2 3.4 3.3  --     CBC  Recent Labs Lab 08/23/15 0200 08/24/15 0520 08/26/15 0500  WBC 19.1* 18.9* 13.2*  HGB 9.3* 9.0* 7.7*  HCT 28.7* 27.5* 23.5*  PLT 370 393 548*    Coag's No results for input(s): APTT, INR in the last 168 hours.  Sepsis Markers  Recent Labs Lab 08/23/15 1005 08/24/15 0520 08/25/15 0540  LATICACIDVEN 1.2  --   --   PROCALCITON 0.17 0.21 0.23    ABG No results for input(s): PHART, PCO2ART, PO2ART in the last 168 hours.  Liver Enzymes No results for input(s): AST, ALT, ALKPHOS, BILITOT, ALBUMIN  in the last 168 hours.  Cardiac Enzymes No results for input(s): TROPONINI, PROBNP in the last 168 hours.  Glucose  Recent Labs Lab 08/25/15 1153 08/25/15 1541 08/25/15 1920 08/25/15 2313 08/26/15 0310 08/26/15 0738  GLUCAP 101* 86 104* 109* 97 102*    Imaging Dg Chest Port 1 View  08/26/2015  CLINICAL DATA:  Respiratory failure EXAM: PORTABLE CHEST 1 VIEW COMPARISON:  08/25/2015 FINDINGS: Endotracheal tube, nasogastric catheter, defibrillator and right jugular line are again seen and stable. The cardiac shadow is stable. The lungs are well-aerated without focal infiltrate. IMPRESSION: No acute abnormality noted. Tubes and lines as described Electronically Signed   By: Inez Catalina M.D.   On: 08/26/2015 08:05     STUDIES:  4/18 CT head >> 3 cm Lt frontal hematoma with SAH and SDH, 6 mm shift 4/24 CT head >> Lt MCA infarct, cytotoxic edema, 7 mm shift 4/24 Echo >> EF 30 to 35%, diffuse hypokinesis, mod MR, PAS 54 mmHg 4/25 Doppler legs b/l >> Lt soleal vein DVT  CULTURES: 4/22 Blood >> Coag neg Staph  4/22 Sputum >> Multiple organisms, none predominant 4/23 Urine >> E coli 4/23 Blood >>  ANTIBIOTICS: 4/23 Vancomycin >> 4/25 4/23 Zosyn >>   SIGNIFICANT EVENTS: 4/18 Admit, neurology/neurosurgery consulted; Lt frontotemporal craniotomy  with evacuation of SDH and ICH with biopsy 4/23 Fever (101.5) >> start Abx 4/26 IVC filter placed  LINES/TUBES: 4/18 ETT >> 4/18 Rt IJ CVL >> 4/18 IVD >>   DISCUSSION: 65 yo female former smoker with vomiting, seizure, slurred speech and altered mental status from Lt ICH.  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  ASSESSMENT / PLAN:  PULMONARY A: Compromised airway in setting of ICH. P:   Pressure support wean >> defer extubation trial until after IVC filter placed F/u CXR  NEUROLOGIC A:   Lt frontal ICH with SAH and SDH with  cytotoxic edema. Seizure. 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 Hx of LBBB, HTN, HLD. Lt leg DVT >> no anticoagulation in setting of ICH/SAH/SDH. P:  Goal SBP < 160, prn labetalol Continue coreg Hold outpt lasix, diovan, corlanor for now For IVC filter by IR 4/26  RENAL A:   Hypokalemia. P:   Monitor renal fx, urine outpt Replace electrolytes as needed  GASTROINTESTINAL A:   Nutrition. P:   Tube feeds while on vent Protonix for SUP  HEMATOLOGIC A:   Anemia of critical illness. Leukocytosis. P:  F/u CBC SCDs for DVT prevention  INFECTIOUS A:   Fever with E coli UTI. Coag neg Staph in 1 blood culture >> likely contaminate. P:   Day 4/5 of vancomycin, zosyn   ENDOCRINE A:   Hx of hypothyroidism, gout. P:   Continue synthroid Hold outpt allopurinol  D/w Dr. Erlinda Hong.  Updated pt's wife at bedside.  CC time 31 minutes.  [name], MD [facility] Pulmonary/Critical Care 08/26/2015, 9:45 AM Pager:  [phone] After 3pm call: [phone]

## 2015-08-26 NOTE — Sedation Documentation (Signed)
Patient is resting comfortably. 

## 2015-08-26 NOTE — Procedures (Signed)
IVC filter L2 inf endplate No comp/EBL

## 2015-08-26 NOTE — Progress Notes (Signed)
Back to full support at 1030 for transport to IR. Transferred to [facility] on full support and brought back to 3M10 with no events noted. RT will continue to monitor.

## 2015-08-26 NOTE — Progress Notes (Signed)
Patient ID: Sylvia Moore, female   DOB: 19-Sep-1950, 65 y.o.   MRN: [id number] Had an IVF to the inferior cava. tracks with eyes.continue as per CCM

## 2015-08-26 NOTE — Progress Notes (Signed)
STROKE TEAM PROGRESS NOTE   SUBJECTIVE (INTERVAL HISTORY) Her wife is at the bedside.  Patient remains intubated off sedation and continued to be awake alert and open eyes. EVD removed yesterday. Pt still not following commands but likely due to aphasia. Found to have left DVT, planned IVC filter today. Likely extubate tomorrow.    OBJECTIVE Temp:  [99.9 F (37.7 C)-100.4 F (38 C)] 99.9 F (37.7 C) (04/26 0700) Pulse Rate:  [71-110] 98 (04/26 1400) Cardiac Rhythm:  [-] Normal sinus rhythm (04/26 1140) Resp:  [13-32] 32 (04/26 1400) BP: (91-117)/(39-67) 104/45 mmHg (04/26 1400) SpO2:  [100 %] 100 % (04/26 1400) FiO2 (%):  [30 %] 30 % (04/26 1204) Weight:  [211 lb 6.7 oz (95.9 kg)] 211 lb 6.7 oz (95.9 kg) (04/26 0405)  CBC:   Recent Labs Lab 08/23/15 0200 08/24/15 0520 08/26/15 0500  WBC 19.1* 18.9* 13.2*  NEUTROABS 15.0* 14.5*  --   HGB 9.3* 9.0* 7.7*  HCT 28.7* 27.5* 23.5*  MCV 100.3* 100.4* 100.4*  PLT 370 393 548*    Basic Metabolic Panel:   Recent Labs Lab 08/24/15 0520 08/25/15 0540 08/26/15 0500  NA 137 135 138  K 4.0 3.5 3.2*  CL 104 101 103  CO2 24 25 27   GLUCOSE 126* 103* 110*  BUN 21* 24* 25*  CREATININE 0.57 0.62 0.66  CALCIUM 8.8* 8.7* 8.8*  MG 1.9 1.9  --   PHOS 3.4 3.3  --     Lipid Panel:     Component Value Date/Time   CHOL 123 08/25/2015 0540   TRIG 103 08/25/2015 0540   HDL 21* 08/25/2015 0540   CHOLHDL 5.9 08/25/2015 0540   VLDL 21 08/25/2015 0540   LDLCALC 81 08/25/2015 0540   HgbA1c:  Lab Results  Component Value Date   HGBA1C 5.7* 08/25/2015   Urine Drug Screen:     Component Value Date/Time   LABOPIA NONE DETECTED 08/18/2015 1044   COCAINSCRNUR NONE DETECTED 08/18/2015 1044   LABBENZ NONE DETECTED 08/18/2015 1044   AMPHETMU NONE DETECTED 08/18/2015 1044   THCU NONE DETECTED 08/18/2015 1044   LABBARB NONE DETECTED 08/18/2015 1044      IMAGING I have personally reviewed the radiological images below and agree with  the radiology interpretations.  Ct Head Wo Contrast 08/24/2015 IMPRESSION: 1. Stable moderate subarachnoid hemorrhage on the left. 2. Left MCA territory infarct. Cytotoxic edema results in increased midline shift, now 7 mm. 3. EVD with no ventriculomegaly.  08/19/2015  Interval LEFT frontotemporal craniotomy for evacuation of temporal lobe hematoma and subdural hematoma. Moderate residual LEFT subarachnoid hemorrhage. Trace LEFT tentorial subdural hematoma. Improved, residual 3 mm LEFT to RIGHT midline shift.  08/18/2015   ~ 3cm left frontal parenchymal hematoma with subarachnoid and subdural extension. There may be an underlying mass and CT with and without contrast is recommended. 6 mm midline shift.  08/18/2015  Large area of hemorrhage in the left hemisphere has progressed since 1 hour ago. There is parenchymal hemorrhage with active extravasation in the left frontal operculum. There is also increase in left hemispheric subdural hemorrhage. There is a small amount of subarachnoid hemorrhage in the left sylvian fissure. Increase in low-density edema on the left frontal lobe. Increase in midline shift which is now 11 mm to the right   Ct Angio Head & Neck W/cm &/or Wo/cm 08/18/2015   Negative for vascular malformation. No aneurysm or AVM is seen to account for the large left frontal temporal hematoma. Large hematoma left frontal  temporal lobe is stable since the recent CT scan. Extravasated contrast shows improvement since the prior study. Mild amount of subarachnoid hemorrhage. 6 mm left subdural hematoma is stable. There is increased edema in the left hemisphere with increased mass-effect on the lateral ventricle. 11 mm midline shift is unchanged.   Ct Chest Abdomen Pelvis W Contrast 08/18/2015   1. No evidence of primary malignancy or metastatic disease in the chest or abdomen. 2. 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 3. Low volume chest with bilateral  atelectasis. 4. Chronic findings are stable   Dg Chest [facility] 08/19/2015   Increased left basilar subsegmental atelectasis. Stable support apparatus.  08/18/2015  1. New central line in unremarkable position.  No pneumothorax. 2. Low volume chest with basilar atelectasis.  08/18/2015   Bibasilar atelectasis. Tube positions as above.   2D echo - - Left ventricle: The cavity size was mildly dilated. Wall  thickness was normal. Systolic function was moderately to  severely reduced. The estimated ejection fraction was in the  range of 30% to 35%. Diffuse hypokinesis. Doppler parameters are  consistent with restrictive physiology, indicative of decreased  left ventricular diastolic compliance and/or increased left  atrial pressure. - Aortic valve: There was trivial regurgitation. - Mitral valve: There was moderate regurgitation. - Pulmonary arteries: Systolic pressure was moderately increased.  PA peak pressure: 54 mm Hg (S). Impressions: - Moderate to severe global reduction in LV function; restrictive  LV filling; sclerotic aortic valve with trace AI; moderate MR;  mild TR with moderately elevated pulmonary pressure; possible  patent foramen ovale noted on subcostal images.  LE venous doppler - DVT noted in the left soleal vein. No DVT RLE.   PHYSICAL EXAM Middle-aged Caucasian lady intubated on sedation. She has postcraniotomy head bandage. Intermittent fever. Head is nontraumatic. Neck is supple without bruit. Cardiac exam no murmur or gallop. Lungs are clear to auscultation. Distal pulses are well felt. Head dressing. Left eye periorbitall swelling post craniotomy.  Neurological Exam  Patient is off sedation. Eyes opened and kept open but does not follow any commands. Pupils are both 2 mm reactive. Corneal reflexes are present. Fundi cannot be visualized. She does not grimace to pain. Spontaneous extremity movements on left upper and lower extremity. Does not move the right  side to painful stimuli except slight movement in the right leg. Purposeful withdrawal on the left leg and arm pain. Right plantar upgoing left downgoing.  ASSESSMENT/PLAN Ms. PRINCESSA SWEDA is a 65 y.o. female with history of CHF, schwannoma of spinal cord, orthostatic HTN, GERD, L BBB, Hepatitis and Hodgkin's lymphoma presenting with altered mental status and dysarthria.   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 Suspect hemorrhage secondary to right MCA infarct vs. underlying tumor. Pathology pending.   Resultant Right hemiplegia and likely aphasia   Neurosurgery consulted Joya Salm) 4/19 crani with IVC placed, ? Tumor. Path pending   CT abdomen/chest/pelvis no evidence of metastatic disease. 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 L frontal lobe ICH w/ SAH, L SDH. Mass effect with 38mm midline shift  Repeat CT head - decreased midline shift now 41mm, but pattern more consistent with left MCA infarct with hemorrhagic transformation  TTE 30-35% down from EF 35-40% in 01/2015  EEG On hold  due to scalp dressing   Need TCD bubble study to be done once more stable  Pacemaker interrogation showed no afib  LDL 81  A1C 5.7  subq heparin for VTE prophylaxis Diet NPO time specified  No antithrombotic prior to admission, no antithrombotic now due to [place]  Ongoing aggressive stroke risk factor management  Therapy recommendations:  pending   Disposition:  pending (has a 65 year-old daughter with mental retardation, unable to make decisions)  If source of hemorrhage is tumor, type of tumor will dictate prognosis.  Patient's neurologic condition post extubation will also help guide plans  Chronic CHF  Has been following with Dr. Rayann Heman in clinic  Pacemaker in situ  EF 35-40% in 01/2015  Now EF  down to 30-35%  CXR no pulmonary edema  Left LE DVT  Found on LE venous doppler  High risk for anticoagulation due to [place]  IVC filter today  Need TCD bubble study to rule out paradoxical emboli once pt more stable  Respiratory failure  Intubated in the ED following seizure from [place]  Still on vent but off sedation  Extubate when able  CCM on board  Seizure  Left greater than right body involvement  On Keppra 1.5g q 12h  If EEG neg for seizure, will decrease dose  EEG pending  Fever   WBC 20.8->17.8->19.1->18.9->13.2  Blood culture NGTD   UA TNTC   Urine Cx E.Coli  Sputum culture negative  Pt is on zosyn  Hypertension  Stable now  Hyperlipidemia  No statin at home  LDL 81, goal < 70  Add lipitor 10mg    Other Stroke Risk Factors  Former Cigarette smoker, quit smoking 47 years ago   ETOH use  Marijuana use  Obesity, Body mass index is 32.15 kg/(m^2).   Family hx stroke (father)  Other Active Problems  History of Hodgkin's lymphoma  History of hepatitis, type unknown  History of spinal cord schwannoma  Hypothyroidism  Hospital day # 8  This patient is critically ill due to left frontal ICH with mass effect s/p evacuation and EVD, suspicious for brain mass, left DVT, fever and at significant risk of neurological worsening, death form CNS infection, recurrent bleeding, cerebral edema and brain herniation as well as PE. This patient's care requires constant monitoring of vital signs, hemodynamics, respiratory and cardiac monitoring, review of multiple databases, neurological assessment, discussion with family, other specialists and medical decision making of high complexity. I spent 35 minutes of neurocritical care time in the care of this patient.   Rosalin Hawking, MD PhD Stroke Neurology 08/26/2015 4:27 PM     To contact Stroke Continuity provider, please refer to [email or web address] After hours, contact General Neurology

## 2015-08-26 NOTE — Consult Note (Signed)
Chief Complaint: Patient was seen in consultation today for retrievable inferior vena cava filter placement Chief Complaint  Patient presents with  . Altered Mental Status   at the request of Dr Rosalin Hawking  Referring Physician(s): Dr Rosalin Hawking Dr Veatrice Kells  Supervising Physician: Marybelle Killings  Patient Status: In-pt   History of Present Illness: Sylvia Moore is a 65 y.o. female   Slurred speech; AMS N/V; aphasia Seizure  To ED 08/18/15 Code stroke CTA 4/18:  IMPRESSION: Negative for vascular malformation. No aneurysm or AVM is seen to account for the large left frontal temporal hematoma.  Large hematoma left frontal temporal lobe is stable since the recent CT scan. Extravasated contrast shows improvement since the prior study. Mild amount of subarachnoid hemorrhage. 6 mm left subdural hematoma is stable.  There is increased edema in the left hemisphere with increased mass-effect on the lateral ventricle. 11 mm midline shift is Unchanged.  Craniotomy and intraventricular catheter placed 4/18 in OR  4/25 LLE doppler: Summary:  - No evidence of deep vein thrombosis involving the right lower  extremity. - Findings consistent with acute deep vein thrombosis involving the  left soleal vein  Request for retrievable inferior vena cava filter placement Cannot anticoagulate secondary ICH   Past Medical History  Diagnosis Date  . Chronic systolic heart failure ([place])   . Depression   . Gout        . HOH (hard of hearing)     has hearing aids  . 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 Orthostatic hypotension        . NICM (nonischemic cardiomyopathy) ([place])     a. 2/2 radiation and chemotherapy b. echo 2/16  EF 20-25% c. non ischemic myoview 5/14 d. s/p STJ CRTD  . GERD (gastroesophageal reflux disease)   . LBBB (left bundle branch block)   . Pneumonia 1960's X 1; 2014  . Hypothyroidism     a. s/p thyroid radiation  . History of blood  transfusion ~ 1976    "related to cyst on my lung"  . Hepatitis ~ 1978    "got hepatitis w/mono; don't know which kind"  . Hodgkin's lymphoma ([place])     a. s/p rad' and chemo  in 1975-1977 b. s/p splenectomy 1970's    Past Surgical History  Procedure Laterality Date  . Hysteroscopy w/d&c    . Colonoscopy w/ polypectomy    . Lumbar fusion  07/2011    T11-L1 posterior lateral  . Dilation and curettage of uterus    . Abdominal exploration surgery  1975    r/t hodgkins disease - had chemo and rad  . Knee arthroscopy Left   . Colposcopy N/A 08/14/2012    Procedure: COLPOSCOPY;  Surgeon: [name], MD;  Location: [facility];  Service: Gynecology;  Laterality: N/A;  Colpo of Vulva and Vagina  . Lesion removal N/A 08/14/2012    Procedure: EXCISION VAGINAL LESION;  Surgeon: [name], MD;  Location: [facility];  Service: Gynecology;  Laterality: N/A;  Wide Local Excision of Right Labia Majori  . Rectal biopsy N/A 08/14/2012    Procedure: BIOPSY RECTAL;  Surgeon: [name], MD;  Location: [facility];  Service: Gynecology;  Laterality: N/A;  peri-anal biopsy  . Esophagogastroduodenoscopy (egd) with propofol N/A 01/20/2014    Procedure: ESOPHAGOGASTRODUODENOSCOPY (EGD) WITH PROPOFOL;  Surgeon: [name], MD;  Location: [facility];  Service: Endoscopy;  Laterality: N/A;  . Colonoscopy  with propofol N/A 01/20/2014    Procedure: COLONOSCOPY WITH PROPOFOL;  Surgeon: [name], MD;  Location: [facility];  Service: Endoscopy;  Laterality: N/A;  . Knee arthroscopy Right 2015    "torn meniscus"  . Left heart catheterization with coronary angiogram N/A 07/01/2014    Procedure: LEFT HEART CATHETERIZATION WITH CORONARY ANGIOGRAM;  Surgeon: [name], MD;  Location: [facility];  Service: Cardiovascular;  Laterality: N/A;  . Back surgery    . Laparoscopic cholecystectomy  ~ 2005  . Neck lesion biopsy Right 1975  . Splenectomy  1970's  . Bi-ventricular implantable cardioverter defibrillator  N/A 07/08/2014    STJ Quadra Assura CRTD implanted by Dr Rayann Heman  . Craniotomy Left 08/18/2015    Procedure: CRANIOTOMY TUMOR EXCISION. Hemorrhage;  Surgeon: Leeroy Cha, MD;  Location: [facility];  Service: Neurosurgery;  Laterality: Left;    Allergies: Codeine  Medications: Prior to Admission medications   Medication Sig Start Date End Date Taking? Authorizing Provider  allopurinol (ZYLOPRIM) 100 MG tablet Take 100 mg by mouth 2 (two) times daily.   Yes Historical Provider, MD  carvedilol (COREG) 12.5 MG tablet Take 0.5 tablets (6.25 mg total) by mouth 2 (two) times daily with a meal. 06/21/14  Yes Eileen Stanford, PA-C  CORLANOR 5 MG TABS tablet TAKE 1 & 1/2 TABLETS TWICE DAILY WITH MEALS. 03/23/15  Yes Thompson Grayer, MD  furosemide (LASIX) 20 MG tablet TAKE 1 TABLET ONCE DAILY. 07/15/15  Yes [name], MD  levothyroxine (SYNTHROID, LEVOTHROID) 125 MCG tablet Take 125 mcg by mouth daily. Reported on 05/22/2015   Yes Historical Provider, MD  Multiple Vitamins-Minerals (MULTIVITAMIN PO) Take 1 tablet by mouth daily.   Yes Historical Provider, MD  omeprazole (PRILOSEC OTC) 20 MG tablet Take 20 mg by mouth at bedtime.    Yes Historical Provider, MD  valsartan (DIOVAN) 40 MG tablet TAKE 1 TABLET ONCE DAILY. 07/17/15  Yes [name], MD  venlafaxine XR (EFFEXOR-XR) 37.5 MG 24 hr capsule Take 37.5 mg by mouth daily with breakfast.    Yes Historical Provider, MD  nitroGLYCERIN (NITROSTAT) 0.4 MG SL tablet Place 1 tablet (0.4 mg total) under the tongue every 5 (five) minutes as needed for chest pain. 06/28/14   Evelene Croon Barrett, PA-C     Family History  Problem Relation Age of Onset  . Hypertension Father   . Stroke Father   . Heart failure Father   . Pneumonia Mother   . Healthy Sister   . Healthy Brother     Social History   Social History  . Marital Status: Married    Spouse Name: N/A  . Number of Children: N/A  . Years of Education: N/A   Social History Main  Topics  . Smoking status: Former Smoker -- 0.25 packs/day for 1 years    Types: Cigarettes    Quit date: 05/02/1968  . Smokeless tobacco: Never Used  . Alcohol Use: 1.8 oz/week    3 Shots of liquor per week     Comment: weekends; if that  . Drug Use: Yes    Special: Marijuana     Comment: for 8 yrs - quit in 1987  . Sexual Activity: Yes    Birth Control/ Protection: Post-menopausal   Other Topics Concern  . None   Social History Narrative      Review of Systems: A 12 point ROS discussed and pertinent positives are indicated in the HPI above.  All other systems are  negative.  Review of Systems  Constitutional:       On vent Eyes responsive No other response    Vital Signs: BP 111/55 mmHg  Pulse 110  Temp(Src) 99.9 F (37.7 C) (Core (Comment))  Resp 24  Ht 5\' 8"  (1.727 m)  Wt 211 lb 6.7 oz (95.9 kg)  BMI 32.15 kg/m2  SpO2 100%  Physical Exam  Cardiovascular: Normal rate and regular rhythm.   Pulmonary/Chest:  vent  Abdominal: Soft. Bowel sounds are normal.  Neurological:  Eyes responsive No other response or movement  Skin: Skin is warm and dry.  Nursing note and vitals reviewed.   Mallampati Score:  MD Evaluation Airway: Other (comments) Airway comments: vent Heart: WNL Abdomen: WNL Chest/ Lungs: Other (comments) Chest/ lungs comments: vent ASA  Classification: 3 Mallampati/Airway Score: Three  Imaging: Ct Angio Head W/cm &/or Wo Cm  08/18/2015  CLINICAL DATA:  Intracranial hemorrhage. Rule out vascular malformation. EXAM: CT ANGIOGRAPHY HEAD AND NECK TECHNIQUE: Multidetector CT imaging of the head and neck was performed using the standard protocol during bolus administration of intravenous contrast. Multiplanar CT image reconstructions and MIPs were obtained to evaluate the vascular anatomy. Carotid stenosis measurements (when applicable) are obtained utilizing NASCET criteria, using the distal internal carotid diameter as the denominator. CONTRAST:   50 mL Isovue 370 IV COMPARISON:  CT head earlier today FINDINGS: CTA NECK Aortic arch: Mild atherosclerotic disease in the aortic arch. No aneurysm or dissection. Proximal great vessels patent. Left subclavian transvenous pacemaker leads noted. Soft tissue mass along the course of the left fourth intercostal nerve extending to the foramen. 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 See separate CT chest report from today. Right carotid system: Right common carotid artery widely patent. Right carotid bifurcation has minimal atherosclerotic disease without stenosis. Left carotid system: Left common carotid artery widely patent. Minimal atherosclerotic disease in the carotid bulb without significant carotid stenosis. Vertebral arteries:Both vertebral arteries patent to the basilar without stenosis. Skeleton: Mild degenerative change at C5-6. No fracture and no acute bony abnormality. Other neck: The patient is intubated with NG tube in place. No cervical adenopathy. CTA HEAD Anterior circulation: Large hematoma (4.0 cm x 6.0 cm) in the left frontotemporal lobe similar to the recent contrast-enhanced CT performed approximate 1 hour prior. High-density contrast is seen in the hematoma compatible with active extravasation. This has improved since the earlier contrast-enhanced CT head. Mild amount of subarachnoid hemorrhage in the left sylvian fissure. Left hemispheric subdural hematoma measures up to 6 mm in diameter. There is low-density edema surrounding the hematoma. Progressive mass-effect on the left ventricle and hemisphere. 11 mm midline shift is approximately the same as the recent study. No evidence of vascular malformation. No aneurysm or AVM. Cavernous carotid widely patent. Anterior and middle cerebral arteries patent bilaterally without stenosis Posterior circulation: Both vertebral arteries patent to the basilar. PICA patent bilaterally. Basilar widely patent. Superior cerebellar and posterior cerebral  arteries patent bilaterally without significant stenosis. Venous sinuses: Patent Anatomic variants: None Delayed phase: Not performed IMPRESSION: Negative for vascular malformation. No aneurysm or AVM is seen to account for the large left frontal temporal hematoma. Large hematoma left frontal temporal lobe is stable since the recent CT scan. Extravasated contrast shows improvement since the prior study. Mild amount of subarachnoid hemorrhage. 6 mm left subdural hematoma is stable. There is increased edema in the left hemisphere with increased mass-effect on the lateral ventricle. 11 mm midline shift is unchanged. I reviewed the findings with Dr.  Botero at 1120 hours on 08/18/2015 Electronically Signed   By: Franchot Gallo M.D.   On: 08/18/2015 11:37   Ct Head Wo Contrast  08/24/2015  CLINICAL DATA:  Intracranial hemorrhage.  Follow-up. EXAM: CT HEAD WITHOUT CONTRAST TECHNIQUE: Contiguous axial images were obtained from the base of the skull through the vertex without intravenous contrast. COMPARISON:  08/19/2015 FINDINGS: Unremarkable left craniotomy. Swelling in the overlying scalp with fluid and hemorrhagic components, cannot exclude CSF leakage. Right frontal EVD with tip near the left caudal thalamic groove. Residual subarachnoid hemorrhage on the left mainly around the operative site is stable. There is well-defined cortical and subcortical low-density in the left MCA territory, posterior frontal and superior temporal, consistent with infarct. The deep gray nuclei are spared and there is sparing along the upper motor strip. With increased cytotoxic edema midline shift has increased to 8 mm at the septum pellucidum. No overt hydrocephalus. No new site of hemorrhage. IMPRESSION: 1. Stable moderate subarachnoid hemorrhage on the left. 2. Left MCA territory infarct. Cytotoxic edema results in increased midline shift, now 7 mm. 3. EVD with no ventriculomegaly. Electronically Signed   By: Monte Fantasia M.D.    On: 08/24/2015 04:09   Ct Head Wo Contrast  08/19/2015  CLINICAL DATA:  Follow-up evaluation. History of non-Hodgkin's lymphoma. EXAM: CT HEAD WITHOUT CONTRAST TECHNIQUE: Contiguous axial images were obtained from the base of the skull through the vertex without intravenous contrast. COMPARISON:  CT head August 18, 2015 FINDINGS: Interval evacuation of LEFT temporal lobe intraparenchymal hematoma with small amount of residual blood products and vasogenic edema. Small amount of LEFT extra-axial pneumocephalus. Moderate amount of subarachnoid hemorrhage in LEFT sylvian fissure extending to LEFT frontoparietal sulci. Evacuation of LEFT subdural hematoma. Interval placement of front ventriculostomy catheter via new high RIGHT frontal burr hole with distal tip traversing the frontal horn of LEFT lateral ventricle, distal tip in LEFT basal ganglia. 3 mm LEFT to RIGHT midline shift, decreased from 6 mm. Decreased mass effect on LEFT lateral ventricle without entrapment or hydrocephalus. Trace subdural hematoma along LEFT cerebellar tentorium. Basal cisterns are patent. Interval LEFT frontal temporal craniotomy. LEFT scalp soft tissue swelling, subcutaneous gas and skin staples. Small amount RIGHT frontal scalp soft tissue swelling and gas. Imaged paranasal sinuses are well-aerated. New LEFT middle ear and mastoid effusion. Craniotomy extends through the anterior aspect of the mastoid segment LEFT temporal bone. Ocular globes and orbital contents are normal. IMPRESSION: Interval LEFT frontotemporal craniotomy for evacuation of temporal lobe hematoma and subdural hematoma. Moderate residual LEFT subarachnoid hemorrhage. Trace LEFT tentorial subdural hematoma. Improved, residual 3 mm LEFT to RIGHT midline shift. Electronically Signed   By: Elon Alas M.D.   On: 08/19/2015 05:28   Ct Head Wo Contrast  08/18/2015  CLINICAL DATA:  Code stroke for disorientation. EXAM: CT HEAD WITHOUT CONTRAST TECHNIQUE: Contiguous  axial images were obtained from the base of the skull through the vertex without intravenous contrast. COMPARISON:  None. FINDINGS: Skull and Sinuses:No posttraumatic or aggressive finding. Clear visualized paranasal sinuses and mastoids. Visualized orbits: Negative. Brain: Heterogeneous hemorrhage in the posterior left frontal lobe with a prominent rim of vasogenic type edema. Given the patchy hemorrhage, volume estimation is confounded. Maximal axial dimension is 31 x 26 mm. There is local subarachnoid and subdural extension. The subdural hemorrhage measures up to 6 mm in thickness. Inferiorly, there is a high-density 27 mm area which is homogeneous and less dense than the patchy hematoma, possible background mass. Midline shift at 6  mm. No ventricular entrapment. No evidence of acute infarct. No other focal finding. Patient has biventricular pacer. Head CT with and without contrast is recommended. Critical Value/emergent results were called by telephone at the time of interpretation on 08/18/2015 at 9:03 am to Dr. Silverio Decamp , who was already aware of these findings. IMPRESSION: ~ 3cm left frontal parenchymal hematoma with subarachnoid and subdural extension. There may be an underlying mass and CT with and without contrast is recommended. 6 mm midline shift. Electronically Signed   By: Monte Fantasia M.D.   On: 08/18/2015 09:09   Ct Head W Contrast  08/18/2015  CLINICAL DATA:  Sudden onset altered mental status. Aphasia. History lymphoma EXAM: CT HEAD WITH CONTRAST TECHNIQUE: Contiguous axial images were obtained from the base of the skull through the vertex with intravenous contrast. CONTRAST:  175mL ISOVUE-300 IOPAMIDOL (ISOVUE-300) INJECTION 61% COMPARISON:  CT head 08/18/2015, 1 hour prior to the enhanced study. FINDINGS: Left cerebral hemorrhage has progressed in the 1 hour between the 2 studies. There is high-density extravasation of contrast in the left frontal operculum. Majority of this hemorrhages  intraparenchymal. There is progressive hemorrhage in the left parietal operculum. There is low-density edema in the left frontal parietal lobe which appears to have progressed in the interval. There is a small amount of subarachnoid hemorrhage in the left sylvian fissure. Left subdural hemorrhage has progressed since the prior study. This is felt to be due to rupture of the parenchymal hemorrhage into the subdural space. Increase in midline shift to the right now measuring 11 mm. Ventricles are not dilated. No skull lesion IMPRESSION: Large area of hemorrhage in the left hemisphere has progressed since 1 hour ago. There is parenchymal hemorrhage with active extravasation in the left frontal operculum. There is also increase in left hemispheric subdural hemorrhage. There is a small amount of subarachnoid hemorrhage in the left sylvian fissure. Increase in low-density edema on the left frontal lobe. Increase in midline shift which is now 11 mm to the right Differential diagnosis includes ruptured aneurysm including mycotic aneurysm. Ruptured vascular malformation such as AVM possible. Hypertensive hemorrhage is possible. Other possibilities would include hemorrhagic infarction. Critical Value/emergent results were called by telephone at the time of interpretation on 08/18/2015 at 10:11 am to Dr. Audria Nine , who verbally acknowledged these results. Electronically Signed   By: Franchot Gallo M.D.   On: 08/18/2015 10:15   Ct Angio Neck W/cm &/or Wo/cm  08/18/2015  CLINICAL DATA:  Intracranial hemorrhage. Rule out vascular malformation. EXAM: CT ANGIOGRAPHY HEAD AND NECK TECHNIQUE: Multidetector CT imaging of the head and neck was performed using the standard protocol during bolus administration of intravenous contrast. Multiplanar CT image reconstructions and MIPs were obtained to evaluate the vascular anatomy. Carotid stenosis measurements (when applicable) are obtained utilizing NASCET criteria, using the distal  internal carotid diameter as the denominator. CONTRAST:  50 mL Isovue 370 IV COMPARISON:  CT head earlier today FINDINGS: CTA NECK Aortic arch: Mild atherosclerotic disease in the aortic arch. No aneurysm or dissection. Proximal great vessels patent. Left subclavian transvenous pacemaker leads noted. Soft tissue mass along the course of the left fourth intercostal nerve extending to the foramen. 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 See separate CT chest report from today. Right carotid system: Right common carotid artery widely patent. Right carotid bifurcation has minimal atherosclerotic disease without stenosis. Left carotid system: Left common carotid artery widely patent. Minimal atherosclerotic disease in the carotid bulb without significant carotid stenosis. Vertebral arteries:Both vertebral  arteries patent to the basilar without stenosis. Skeleton: Mild degenerative change at C5-6. No fracture and no acute bony abnormality. Other neck: The patient is intubated with NG tube in place. No cervical adenopathy. CTA HEAD Anterior circulation: Large hematoma (4.0 cm x 6.0 cm) in the left frontotemporal lobe similar to the recent contrast-enhanced CT performed approximate 1 hour prior. High-density contrast is seen in the hematoma compatible with active extravasation. This has improved since the earlier contrast-enhanced CT head. Mild amount of subarachnoid hemorrhage in the left sylvian fissure. Left hemispheric subdural hematoma measures up to 6 mm in diameter. There is low-density edema surrounding the hematoma. Progressive mass-effect on the left ventricle and hemisphere. 11 mm midline shift is approximately the same as the recent study. No evidence of vascular malformation. No aneurysm or AVM. Cavernous carotid widely patent. Anterior and middle cerebral arteries patent bilaterally without stenosis Posterior circulation: Both vertebral arteries patent to the basilar. PICA patent bilaterally. Basilar  widely patent. Superior cerebellar and posterior cerebral arteries patent bilaterally without significant stenosis. Venous sinuses: Patent Anatomic variants: None Delayed phase: Not performed IMPRESSION: Negative for vascular malformation. No aneurysm or AVM is seen to account for the large left frontal temporal hematoma. Large hematoma left frontal temporal lobe is stable since the recent CT scan. Extravasated contrast shows improvement since the prior study. Mild amount of subarachnoid hemorrhage. 6 mm left subdural hematoma is stable. There is increased edema in the left hemisphere with increased mass-effect on the lateral ventricle. 11 mm midline shift is unchanged. I reviewed the findings with Dr.  Joya Salm at 1120 hours on 08/18/2015 Electronically Signed   By: Franchot Gallo M.D.   On: 08/18/2015 11:37   Ct Chest W Contrast  08/18/2015  CLINICAL DATA:  Abnormal head CT. History of chest mass and lymphoma. EXAM: CT CHEST, ABDOMEN, AND PELVIS WITH CONTRAST TECHNIQUE: Multidetector CT imaging of the chest, abdomen and pelvis was performed following the standard protocol during bolus administration of intravenous contrast. CONTRAST:  171mL ISOVUE-300 IOPAMIDOL (ISOVUE-300) INJECTION 61% COMPARISON:  04/07/2011 abdominal CT FINDINGS: CT CHEST THORACIC INLET/BODY WALL: Intubation with endotracheal tube tip above the carina. MEDIASTINUM: Biventricular pacer from the left with leads in unremarkable position. No acute vascular finding. No lymphadenopathy. Low-density mass along the left T3 intercostal nerve is stable or mildly progressed over years (2014 comparison) with 18 mm thickness and 32 mm length. 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 No aggressive features. LUNG WINDOWS: Low volume chest with perihilar streaky and reticular opacities consistent with atelectasis. No central airway obstruction. No suspicious nodules. OSSEOUS: Remote T12 body fracture with kyphosis. The fracture has been fixed by T11-L1 posterior  fixation. CT ABDOMEN AND PELVIS Lower chest and abdominal wall:  No contributory findings. Hepatobiliary: No focal liver abnormality.Cholecystectomy with negative common bile duct Pancreas: Stable appearance Spleen: Surgically absent Adrenals/Urinary Tract: Negative adrenals. Left upper pole atrophy, likely compromise of an accessory renal artery. Multiple left renal cortical cysts no hydronephrosis. Negative bladder. Reproductive:Essentially stable 55 mm left fundal fibroid. Tubal ligation clips. Stomach/Bowel:  No obstruction. No arthritic changes. Vascular/Lymphatic: No acute vascular abnormality. Presumed lymphadenectomy clips in the retroperitoneum. Stable prominent lymph nodes along the left gastric, benign given long interval. Peritoneal: No ascites or pneumoperitoneum. Musculoskeletal: No acute or aggressive process. IMPRESSION: 1. No evidence of primary malignancy or metastatic disease in the chest or abdomen. 2. 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 3. Low volume chest with bilateral atelectasis. 4. Chronic findings  are stable and described above. Electronically Signed   By: Monte Fantasia M.D.   On: 08/18/2015 10:13   Ct Abdomen Pelvis W Contrast  08/18/2015  CLINICAL DATA:  Abnormal head CT. History of chest mass and lymphoma. EXAM: CT CHEST, ABDOMEN, AND PELVIS WITH CONTRAST TECHNIQUE: Multidetector CT imaging of the chest, abdomen and pelvis was performed following the standard protocol during bolus administration of intravenous contrast. CONTRAST:  178mL ISOVUE-300 IOPAMIDOL (ISOVUE-300) INJECTION 61% COMPARISON:  04/07/2011 abdominal CT FINDINGS: CT CHEST THORACIC INLET/BODY WALL: Intubation with endotracheal tube tip above the carina. MEDIASTINUM: Biventricular pacer from the left with leads in unremarkable position. No acute vascular finding. No lymphadenopathy. Low-density mass along the left T3 intercostal nerve is stable or mildly progressed  over years (2014 comparison) with 18 mm thickness and 32 mm length. 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 No aggressive features. LUNG WINDOWS: Low volume chest with perihilar streaky and reticular opacities consistent with atelectasis. No central airway obstruction. No suspicious nodules. OSSEOUS: Remote T12 body fracture with kyphosis. The fracture has been fixed by T11-L1 posterior fixation. CT ABDOMEN AND PELVIS Lower chest and abdominal wall:  No contributory findings. Hepatobiliary: No focal liver abnormality.Cholecystectomy with negative common bile duct Pancreas: Stable appearance Spleen: Surgically absent Adrenals/Urinary Tract: Negative adrenals. Left upper pole atrophy, likely compromise of an accessory renal artery. Multiple left renal cortical cysts no hydronephrosis. Negative bladder. Reproductive:Essentially stable 55 mm left fundal fibroid. Tubal ligation clips. Stomach/Bowel:  No obstruction. No arthritic changes. Vascular/Lymphatic: No acute vascular abnormality. Presumed lymphadenectomy clips in the retroperitoneum. Stable prominent lymph nodes along the left gastric, benign given long interval. Peritoneal: No ascites or pneumoperitoneum. Musculoskeletal: No acute or aggressive process. IMPRESSION: 1. No evidence of primary malignancy or metastatic disease in the chest or abdomen. 2. 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 3. Low volume chest with bilateral atelectasis. 4. Chronic findings are stable and described above. Electronically Signed   By: Monte Fantasia M.D.   On: 08/18/2015 10:13   Dg Chest Port 1 View  08/26/2015  CLINICAL DATA:  Respiratory failure EXAM: PORTABLE CHEST 1 VIEW COMPARISON:  08/25/2015 FINDINGS: Endotracheal tube, nasogastric catheter, defibrillator and right jugular line are again seen and stable. The cardiac shadow is stable. The lungs are well-aerated without focal infiltrate. IMPRESSION: No acute abnormality noted.  Tubes and lines as described Electronically Signed   By: Inez Catalina M.D.   On: 08/26/2015 08:05   Dg Chest Port 1 View  08/25/2015  CLINICAL DATA:  Respiratory failure. EXAM: PORTABLE CHEST 1 VIEW COMPARISON:  08/24/2015 FINDINGS: Endotracheal tube remains in place and terminates 3.5-4 cm above the carina. Enteric tube is looped in the proximal stomach. Right jugular central venous catheter terminates over the lower SVC. Cardiac silhouette is upper limits of normal in size. ICD remains in place. Lung volumes are unchanged with similar appearance of mild left greater than right basilar lung opacities. No sizable pleural effusion or pneumothorax is identified. IMPRESSION: Unchanged mild bibasilar opacities, likely atelectasis. Electronically Signed   By: Logan Bores M.D.   On: 08/25/2015 07:47   Dg Chest Port 1 View  08/24/2015  CLINICAL DATA:  ET tube in place EXAM: PORTABLE CHEST 1 VIEW COMPARISON:  08/23/2015 FINDINGS: Left pacer, endotracheal tube and right central line remain in place, unchanged. NG tube tip in the proximal stomach. Mild perihilar and bibasilar opacities could reflect atelectasis or edema. Heart is borderline in size. No visible significant  effusions or acute bony abnormality. IMPRESSION: Perihilar and bibasilar opacities which could represent atelectasis or edema. No real change since prior study. Electronically Signed   By: Rolm Baptise M.D.   On: 08/24/2015 07:34   Dg Chest Port 1 View  08/23/2015  CLINICAL DATA:  Endotracheal tube.  Shortness of breath. EXAM: PORTABLE CHEST 1 VIEW COMPARISON:  08/22/2015 FINDINGS: Endotracheal tube tip measures 4.7 cm above the carina. Enteric tube tip is off the field of view but below the left hemidiaphragm. Right central venous catheter with tip over the low SVC region. Cardiac pacemaker. No pneumothorax. Shallow inspiration. Heart size and pulmonary vascularity are normal. Mild atelectasis in the lung bases. No focal consolidation.  Postoperative changes in the lumbar spine. IMPRESSION: Appliances are in satisfactory position. Shallow inspiration with atelectasis in the lung bases. Electronically Signed   By: Lucienne Capers M.D.   On: 08/23/2015 05:03   Dg Chest Port 1 View  08/22/2015  CLINICAL DATA:  Followup intubated patient. EXAM: PORTABLE CHEST 1 VIEW COMPARISON:  08/21/2015 FINDINGS: Lungs essentially clear.  No pleural effusion or pneumothorax. Cardiac silhouette is normal in size. Left anterior chest wall biventricular cardioverter-defibrillator is stable. Endotracheal tube tip projects 4.4 cm above the Carina, stable. Nasal/orogastric tube passes below the diaphragm well into the stomach. Right internal jugular central venous line tip projects in the lower superior vena cava, also unchanged. IMPRESSION: 1. No acute cardiopulmonary disease. 2. Stable support apparatus. Electronically Signed   By: Lajean Manes M.D.   On: 08/22/2015 07:20   Dg Chest Port 1 View  08/21/2015  CLINICAL DATA:  Endotracheal tube placement.  Ventilator dependence. EXAM: PORTABLE CHEST 1 VIEW COMPARISON:  08/20/2015 FINDINGS: 0618 hours. Endotracheal tube tip is 5.0 cm above the base of the carina. The NG tube passes into the stomach although the distal tip position is not included on the film. Right IJ central line tip overlies the mid SVC. Left sided permanent pacer/ AICD remains in place. Telemetry leads overlie the chest. Stable asymmetric elevation right hemidiaphragm. Slight interval increase in vascular congestion without overt edema. No focal airspace consolidation or pleural effusion. IMPRESSION: Slight increase in vascular congestion.  Otherwise stable exam. Electronically Signed   By: Misty Stanley M.D.   On: 08/21/2015 07:59   Dg Chest Port 1 View  08/20/2015  CLINICAL DATA:  Acute respiratory failure with hypoxia, intracranial hemorrhage with cerebral edema EXAM: PORTABLE CHEST 1 VIEW COMPARISON:  Portable chest x-ray of August 19, 2015  FINDINGS: The right lung is less well inflated today but the left lung is adequately inflated. There has been interval clearing of the retrocardiac density. There is no significant pleural effusion and there is no pneumothorax. The heart normal in size. The pulmonary vascularity is not engorged. The implantable pacemaker defibrillator is in stable position. The endotracheal tube tip lies 4.7 cm above the carina. The esophagogastric tube tip and proximal port lie in the gastric body. IMPRESSION: Improved left basilar atelectasis. No significant pleural effusion. The support tubes are in reasonable position. Electronically Signed   By: David  Martinique M.D.   On: 08/20/2015 07:51   Dg Chest Port 1 View  08/19/2015  CLINICAL DATA:  Acute respiratory disease. EXAM: PORTABLE CHEST 1 VIEW COMPARISON:  August 18, 2015. FINDINGS: Endotracheal and nasogastric tubes are unchanged in position. Stable cardiomediastinal silhouette. Left-sided pacemaker is unchanged in position. Right internal jugular catheter is unchanged in position. No pneumothorax is noted. Increased left basilar subsegmental atelectasis is noted. Right lung  is clear. Bony thorax is unremarkable. IMPRESSION: Increased left basilar subsegmental atelectasis. Stable support apparatus. Electronically Signed   By: Marijo Conception, M.D.   On: 08/19/2015 07:26   Dg Chest Port 1 View  08/18/2015  CLINICAL DATA:  Status post endotracheal tube and central line placement EXAM: PORTABLE CHEST 1 VIEW COMPARISON:  08/18/2015 FINDINGS: Endotracheal tube tip at the clavicular heads. An orogastric tube reaches stomach at least. Right IJ central line with tip at the lower SVC. No pneumothorax. Biventricular ICD/ pacer from the left, lead in stable position. Low volume chest with bibasilar atelectasis.  Normal heart size. IMPRESSION: 1. New central line in unremarkable position.  No pneumothorax. 2. Low volume chest with basilar atelectasis. Electronically Signed   By:  Monte Fantasia M.D.   On: 08/18/2015 16:06   Dg Chest Portable 1 View  08/18/2015  CLINICAL DATA:  Endotracheal and nasogastric tube placements, intubation, chronic systolic heart failure, non ischemic cardiomyopathy, former smoker, personal history non-Hodgkin's lymphoma EXAM: PORTABLE CHEST 1 VIEW COMPARISON:  Portable exam 0921 hours compared to 05/22/2015 FINDINGS: Tip of endotracheal tube projects 3.4 cm above carina. Nasogastric tube extends into stomach. LEFT subclavian pacemaker/AICD leads project over RIGHT atrium, RIGHT ventricle and coronary sinus. Normal heart size, mediastinal contours and pulmonary vascularity. Atherosclerotic calcification aorta. Bibasilar atelectasis. No definite infiltrate, pleural effusion or pneumothorax. IMPRESSION: Bibasilar atelectasis. Tube positions as above. Electronically Signed   By: Lavonia Dana M.D.   On: 08/18/2015 09:51    Labs:  CBC:  Recent Labs  08/22/15 0520 08/23/15 0200 08/24/15 0520 08/26/15 0500  WBC 17.8* 19.1* 18.9* 13.2*  HGB 9.7* 9.3* 9.0* 7.7*  HCT 29.9* 28.7* 27.5* 23.5*  PLT 325 370 393 548*    COAGS:  Recent Labs  08/18/15 0832  INR 1.12  APTT 30    BMP:  Recent Labs  08/23/15 0200 08/24/15 0520 08/25/15 0540 08/26/15 0500  NA 138 137 135 138  K 3.5 4.0 3.5 3.2*  CL 106 104 101 103  CO2 26 24 25 27   GLUCOSE 121* 126* 103* 110*  BUN 22* 21* 24* 25*  CALCIUM 8.8* 8.8* 8.7* 8.8*  CREATININE 0.59 0.57 0.62 0.66  GFRNONAA >60 >60 >60 >60  GFRAA >60 >60 >60 >60    LIVER FUNCTION TESTS:  Recent Labs  08/18/15 0832  BILITOT 0.7  AST 36  ALT 26  ALKPHOS 85  PROT 8.4*  ALBUMIN 3.8    TUMOR MARKERS: No results for input(s): AFPTM, CEA, CA199, CHROMGRNA in the last 8760 hours.  Assessment and Plan:  SAH Craniotomy and IVC placed 4/18 Now with new LLE DVT Cannot anticoagulate Scheduled for retrievable IVC filter Risks and Benefits discussed with the patient's wife including, but not limited to  bleeding, infection, contrast induced renal failure, filter fracture or migration which can lead to emergency surgery or even death, strut penetration with damage or irritation to adjacent structures and caval thrombosis. All of her questions were answered, patient's wife is agreeable to proceed. Consent signed and in chart.   Thank you for this interesting consult.  I greatly enjoyed meeting Sylvia Moore and look forward to participating in their care.  A copy of this report was sent to the requesting provider on this date.  Electronically Signed: Monia Sabal A 08/26/2015, 8:51 AM   I spent a total of 20 Minutes    in face to face in clinical consultation, greater than 50% of which was counseling/coordinating care for IVC filter

## 2015-08-26 NOTE — Sedation Documentation (Signed)
Patient is resting comfortably. Pt vented- Resp at bedsisde

## 2015-08-27 ENCOUNTER — Inpatient Hospital Stay (HOSPITAL_COMMUNITY): Payer: BC Managed Care – PPO

## 2015-08-27 DIAGNOSIS — J9589 Other postprocedural complications and disorders of respiratory system, not elsewhere classified: Secondary | ICD-10-CM | POA: Insufficient documentation

## 2015-08-27 DIAGNOSIS — R061 Stridor: Secondary | ICD-10-CM

## 2015-08-27 DIAGNOSIS — I255 Ischemic cardiomyopathy: Secondary | ICD-10-CM

## 2015-08-27 LAB — GLUCOSE, CAPILLARY
GLUCOSE-CAPILLARY: 96 mg/dL (ref 65–99)
GLUCOSE-CAPILLARY: 111 mg/dL — AB (ref 65–99)
Glucose-Capillary: 104 mg/dL — ABNORMAL HIGH (ref 65–99)
Glucose-Capillary: 109 mg/dL — ABNORMAL HIGH (ref 65–99)
Glucose-Capillary: 115 mg/dL — ABNORMAL HIGH (ref 65–99)
Glucose-Capillary: 130 mg/dL — ABNORMAL HIGH (ref 65–99)

## 2015-08-27 LAB — BASIC METABOLIC PANEL
ANION GAP: 9 (ref 5–15)
BUN: 25 mg/dL — ABNORMAL HIGH (ref 6–20)
CALCIUM: 8.8 mg/dL — AB (ref 8.9–10.3)
CO2: 25 mmol/L (ref 22–32)
Chloride: 106 mmol/L (ref 101–111)
Creatinine, Ser: 0.7 mg/dL (ref 0.44–1.00)
GLUCOSE: 107 mg/dL — AB (ref 65–99)
POTASSIUM: 3.2 mmol/L — AB (ref 3.5–5.1)
Sodium: 140 mmol/L (ref 135–145)

## 2015-08-27 LAB — POCT I-STAT EG7
Acid-Base Excess: 1 mmol/L (ref 0.0–2.0)
BICARBONATE: 25.8 meq/L — AB (ref 20.0–24.0)
CALCIUM ION: 1.3 mmol/L (ref 1.13–1.30)
HCT: 22 % — ABNORMAL LOW (ref 36.0–46.0)
Hemoglobin: 7.5 g/dL — ABNORMAL LOW (ref 12.0–15.0)
O2 Saturation: 88 %
PCO2 VEN: 38.8 mmHg — AB (ref 45.0–50.0)
PH VEN: 7.43 — AB (ref 7.250–7.300)
POTASSIUM: 3.8 mmol/L (ref 3.5–5.1)
Sodium: 140 mmol/L (ref 135–145)
TCO2: 27 mmol/L (ref 0–100)
pO2, Ven: 53 mmHg — ABNORMAL HIGH (ref 31.0–45.0)

## 2015-08-27 LAB — IRON AND TIBC
IRON: 29 ug/dL (ref 28–170)
Saturation Ratios: 15 % (ref 10.4–31.8)
TIBC: 193 ug/dL — AB (ref 250–450)
UIBC: 164 ug/dL

## 2015-08-27 LAB — CBC
HEMATOCRIT: 23.4 % — AB (ref 36.0–46.0)
Hemoglobin: 7.6 g/dL — ABNORMAL LOW (ref 12.0–15.0)
MCH: 32.9 pg (ref 26.0–34.0)
MCHC: 32.5 g/dL (ref 30.0–36.0)
MCV: 101.3 fL — AB (ref 78.0–100.0)
PLATELETS: 625 10*3/uL — AB (ref 150–400)
RBC: 2.31 MIL/uL — AB (ref 3.87–5.11)
RDW: 13.4 % (ref 11.5–15.5)
WBC: 18.9 10*3/uL — AB (ref 4.0–10.5)

## 2015-08-27 LAB — C DIFFICILE QUICK SCREEN W PCR REFLEX
C DIFFICLE (CDIFF) ANTIGEN: NEGATIVE
C Diff interpretation: NEGATIVE
C Diff toxin: NEGATIVE

## 2015-08-27 LAB — CULTURE, BLOOD (ROUTINE X 2): Culture: NO GROWTH

## 2015-08-27 LAB — FERRITIN: FERRITIN: 442 ng/mL — AB (ref 11–307)

## 2015-08-27 MED ORDER — MIDAZOLAM HCL 2 MG/2ML IJ SOLN
INTRAMUSCULAR | Status: AC
Start: 2015-08-27 — End: 2015-08-27
  Filled 2015-08-27: qty 2

## 2015-08-27 MED ORDER — FENTANYL CITRATE (PF) 100 MCG/2ML IJ SOLN
25.0000 ug | INTRAMUSCULAR | Status: DC | PRN
  Administered 2015-08-27: 50 ug via INTRAVENOUS
  Administered 2015-08-27 – 2015-08-28 (×3): 100 ug via INTRAVENOUS
  Filled 2015-08-27 (×4): qty 2

## 2015-08-27 MED ORDER — MIDAZOLAM HCL 2 MG/2ML IJ SOLN
4.0000 mg | Freq: Once | INTRAMUSCULAR | Status: AC
  Administered 2015-08-27: 4 mg via INTRAVENOUS

## 2015-08-27 MED ORDER — MIDAZOLAM HCL 2 MG/2ML IJ SOLN
2.0000 mg | INTRAMUSCULAR | Status: DC | PRN
  Administered 2015-08-31: 2 mg via INTRAVENOUS
  Filled 2015-08-27: qty 2

## 2015-08-27 MED ORDER — ETOMIDATE 2 MG/ML IV SOLN
10.0000 mg | Freq: Once | INTRAVENOUS | Status: AC
  Administered 2015-08-27: 10 mg via INTRAVENOUS

## 2015-08-27 MED ORDER — PANTOPRAZOLE SODIUM 40 MG PO PACK
40.0000 mg | PACK | Freq: Two times a day (BID) | ORAL | Status: DC
  Administered 2015-08-27 – 2015-08-30 (×7): 40 mg
  Filled 2015-08-27 (×7): qty 20

## 2015-08-27 MED ORDER — POTASSIUM CHLORIDE 10 MEQ/50ML IV SOLN
10.0000 meq | INTRAVENOUS | Status: AC
  Administered 2015-08-27 (×4): 10 meq via INTRAVENOUS
  Filled 2015-08-27 (×4): qty 50

## 2015-08-27 MED ORDER — FENTANYL CITRATE (PF) 100 MCG/2ML IJ SOLN
100.0000 ug | Freq: Once | INTRAMUSCULAR | Status: AC
  Administered 2015-08-27: 100 ug via INTRAVENOUS

## 2015-08-27 MED ORDER — MIDAZOLAM HCL 2 MG/2ML IJ SOLN
INTRAMUSCULAR | Status: AC
  Filled 2015-08-27: qty 2

## 2015-08-27 MED ORDER — METHYLPREDNISOLONE SODIUM SUCC 40 MG IJ SOLR
40.0000 mg | Freq: Four times a day (QID) | INTRAMUSCULAR | Status: AC
  Administered 2015-08-27 – 2015-08-28 (×4): 40 mg via INTRAVENOUS
  Filled 2015-08-27 (×4): qty 1

## 2015-08-27 MED ORDER — FENTANYL CITRATE (PF) 100 MCG/2ML IJ SOLN
INTRAMUSCULAR | Status: AC
  Filled 2015-08-27: qty 2

## 2015-08-27 NOTE — Progress Notes (Signed)
Pharmacy Antibiotic Note  LINDITA JUNGHANS is a 65 y.o. female admitted on 08/18/2015 with Ecoli UTI, CONS in [facility] (likely contaminant).  Pharmacy has been consulted for zosyn dosing - D#5/6 per MD note. WBC up 18.9, Tmax 101.3. CrCl~86.   Plan: Zosyn 3.375g IV q8h  Monitor clinical progress, c/s, renal function, De-escalate?  Height: 5\' 8"  (172.7 cm) Weight: 214 lb 11.7 oz (97.4 kg) IBW/kg (Calculated) : 63.9  Temp (24hrs), Avg:99.6 F (37.6 C), Min:98.9 F (37.2 C), Max:101.3 F (38.5 C)   Recent Labs Lab 08/22/15 0520 08/23/15 0200 08/23/15 1005 08/24/15 0520 08/24/15 1209 08/25/15 0540 08/26/15 0500 08/27/15 0500  WBC 17.8* 19.1*  --  18.9*  --   --  13.2* 18.9*  CREATININE 0.62 0.59  --  0.57  --  0.62 0.66 0.70  LATICACIDVEN  --   --  1.2  --   --   --   --   --   VANCOTROUGH  --   --   --   --  14  --   --   --     Estimated Creatinine Clearance: 86.7 mL/min (by C-G formula based on Cr of 0.7).    Allergies  Allergen Reactions  . Codeine Nausea And Vomiting    Antimicrobials this admission: Vancomycin 4/23>>4/25 Zosyn 4/23>>  Dose adjustments this admission: 4/24 VT: 14 on 1g q8h  Microbiology results:  4/23 resp cx: mult organisms 4/23 BCx2: ngtd 4/22 Blood x2: coag neg staph A 1/2 - contaminant? 4/22 TA: 4/23 Urine: Ecoli (R-amp, I-unasyn) 4/18 MRSA PCR neg   Elicia Lamp, PharmD, [facility] Clinical Pharmacist Pager [phone] 08/27/2015 8:17 AM

## 2015-08-27 NOTE — Progress Notes (Signed)
STROKE TEAM PROGRESS NOTE   SUBJECTIVE (INTERVAL HISTORY) Her wife is at the bedside.  Patient was just extubated, however, developed stridor, put on CPAP and received solumedrol but not effective. She was then re-intubated. IVC filter placed yesterday.    OBJECTIVE Temp:  [97.4 F (36.3 C)-100.2 F (37.9 C)] 97.4 F (36.3 C) (04/27 1600) Pulse Rate:  [71-108] 97 (04/27 1700) Cardiac Rhythm:  [-] Normal sinus rhythm (04/27 1200) Resp:  [16-41] 22 (04/27 1700) BP: (84-170)/(40-137) 154/137 mmHg (04/27 1700) SpO2:  [90 %-100 %] 100 % (04/27 1700) FiO2 (%):  [30 %-100 %] 40 % (04/27 1538) Weight:  [214 lb 11.7 oz (97.4 kg)] 214 lb 11.7 oz (97.4 kg) (04/27 0500)  CBC:   Recent Labs Lab 08/23/15 0200 08/24/15 0520 08/26/15 0500  08/27/15 0500 08/27/15 1136  WBC 19.1* 18.9* 13.2*  --  18.9*  --   NEUTROABS 15.0* 14.5*  --   --   --   --   HGB 9.3* 9.0* 7.7*  < > 7.6* 7.5*  HCT 28.7* 27.5* 23.5*  < > 23.4* 22.0*  MCV 100.3* 100.4* 100.4*  --  101.3*  --   PLT 370 393 548*  --  625*  --   < > = values in this interval not displayed.  Basic Metabolic Panel:   Recent Labs Lab 08/24/15 0520 08/25/15 0540 08/26/15 0500 08/27/15 0500 08/27/15 1136  NA 137 135 138 140 140  K 4.0 3.5 3.2* 3.2* 3.8  CL 104 101 103 106  --   CO2 24 25 27 25   --   GLUCOSE 126* 103* 110* 107*  --   BUN 21* 24* 25* 25*  --   CREATININE 0.57 0.62 0.66 0.70  --   CALCIUM 8.8* 8.7* 8.8* 8.8*  --   MG 1.9 1.9  --   --   --   PHOS 3.4 3.3  --   --   --     Lipid Panel:     Component Value Date/Time   CHOL 123 08/25/2015 0540   TRIG 103 08/25/2015 0540   HDL 21* 08/25/2015 0540   CHOLHDL 5.9 08/25/2015 0540   VLDL 21 08/25/2015 0540   LDLCALC 81 08/25/2015 0540   HgbA1c:  Lab Results  Component Value Date   HGBA1C 5.7* 08/25/2015   Urine Drug Screen:     Component Value Date/Time   LABOPIA NONE DETECTED 08/18/2015 1044   COCAINSCRNUR NONE DETECTED 08/18/2015 1044   LABBENZ NONE  DETECTED 08/18/2015 1044   AMPHETMU NONE DETECTED 08/18/2015 1044   THCU NONE DETECTED 08/18/2015 1044   LABBARB NONE DETECTED 08/18/2015 1044      IMAGING I have personally reviewed the radiological images below and agree with the radiology interpretations.  Ct Head Wo Contrast 08/24/2015 IMPRESSION: 1. Stable moderate subarachnoid hemorrhage on the left. 2. Left MCA territory infarct. Cytotoxic edema results in increased midline shift, now 7 mm. 3. EVD with no ventriculomegaly.  08/19/2015  Interval LEFT frontotemporal craniotomy for evacuation of temporal lobe hematoma and subdural hematoma. Moderate residual LEFT subarachnoid hemorrhage. Trace LEFT tentorial subdural hematoma. Improved, residual 3 mm LEFT to RIGHT midline shift.  08/18/2015   ~ 3cm left frontal parenchymal hematoma with subarachnoid and subdural extension. There may be an underlying mass and CT with and without contrast is recommended. 6 mm midline shift.  08/18/2015  Large area of hemorrhage in the left hemisphere has progressed since 1 hour ago. There is parenchymal hemorrhage with active extravasation  in the left frontal operculum. There is also increase in left hemispheric subdural hemorrhage. There is a small amount of subarachnoid hemorrhage in the left sylvian fissure. Increase in low-density edema on the left frontal lobe. Increase in midline shift which is now 11 mm to the right   Ct Angio Head & Neck W/cm &/or Wo/cm 08/18/2015   Negative for vascular malformation. No aneurysm or AVM is seen to account for the large left frontal temporal hematoma. Large hematoma left frontal temporal lobe is stable since the recent CT scan. Extravasated contrast shows improvement since the prior study. Mild amount of subarachnoid hemorrhage. 6 mm left subdural hematoma is stable. There is increased edema in the left hemisphere with increased mass-effect on the lateral ventricle. 11 mm midline shift is unchanged.   Ct Chest Abdomen Pelvis W  Contrast 08/18/2015   1. No evidence of primary malignancy or metastatic disease in the chest or abdomen. 2. 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 3. Low volume chest with bilateral atelectasis. 4. Chronic findings are stable   Dg Chest [facility] 08/19/2015   Increased left basilar subsegmental atelectasis. Stable support apparatus.  08/18/2015  1. New central line in unremarkable position.  No pneumothorax. 2. Low volume chest with basilar atelectasis.  08/18/2015   Bibasilar atelectasis. Tube positions as above.   2D echo - - Left ventricle: The cavity size was mildly dilated. Wall  thickness was normal. Systolic function was moderately to  severely reduced. The estimated ejection fraction was in the  range of 30% to 35%. Diffuse hypokinesis. Doppler parameters are  consistent with restrictive physiology, indicative of decreased  left ventricular diastolic compliance and/or increased left  atrial pressure. - Aortic valve: There was trivial regurgitation. - Mitral valve: There was moderate regurgitation. - Pulmonary arteries: Systolic pressure was moderately increased.  PA peak pressure: 54 mm Hg (S). Impressions: - Moderate to severe global reduction in LV function; restrictive  LV filling; sclerotic aortic valve with trace AI; moderate MR;  mild TR with moderately elevated pulmonary pressure; possible  patent foramen ovale noted on subcostal images.  LE venous doppler - DVT noted in the left soleal vein. No DVT RLE.   PHYSICAL EXAM Middle-aged Caucasian lady again intubated on sedation. Head is nontraumatic. Neck is supple without bruit. Cardiac exam no murmur or gallop. Lungs are clear to auscultation. Distal pulses are well felt. Head dressing. Left eye periorbitall swelling post craniotomy.  Neurological Exam  Patient is again intubated and on sedation. Eyes closed now and does not follow any commands. Pupils are both 2  mm reactive. Corneal reflexes are present. Fundi cannot be visualized. She does not grimace to pain. Spontaneous extremity movements on left upper and lower extremity. Does not move the right side to painful stimuli. Right plantar upgoing left downgoing.  ASSESSMENT/PLAN Sylvia Moore is a 65 y.o. female with history of CHF, schwannoma of spinal cord, orthostatic HTN, GERD, L BBB, Hepatitis and Hodgkin's lymphoma presenting with altered mental status and dysarthria.   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 Suspect hemorrhage secondary to right MCA infarct vs. underlying tumor. Pathology pending.   Resultant Right hemiplegia and likely aphasia   Neurosurgery consulted Joya Salm) 4/19 crani with IVC placed, ? Tumor. Path pending   CT abdomen/chest/pelvis no evidence of metastatic disease. 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 L frontal lobe ICH w/ SAH, L SDH. Mass effect with 54mm midline shift  Repeat CT head - decreased midline shift now 30mm, but pattern more consistent with left MCA infarct with hemorrhagic transformation  TTE 30-35% down from EF 35-40% in 01/2015  EEG On hold due to scalp dressing   Need TCD bubble study to be done once more stable  Pacemaker interrogation showed no afib  LDL 81  A1C 5.7  subq heparin for VTE prophylaxis Diet NPO time specified  No antithrombotic prior to admission, no antithrombotic now due to [place]  Ongoing aggressive stroke risk factor management  Therapy recommendations:  pending   Disposition:  pending (has a 36 year-old daughter with mental retardation, unable to make decisions)  If source of hemorrhage is tumor, type of tumor will dictate prognosis.  Patient's neurologic condition post extubation will also help guide plans  Chronic  CHF  Has been following with Dr. Rayann Heman in clinic  Pacemaker in situ  EF 35-40% in 01/2015  Now EF down to 30-35%  CXR no pulmonary edema  Left LE DVT  Found on LE venous doppler  High risk for anticoagulation due to [place]  IVC filter today  Need TCD bubble study to rule out paradoxical emboli once pt more stable  Respiratory failure  Intubated in the ED following seizure from [place]  Not tolerating extubate due to stridor  Re-intubated today  CCM on board  Seizure  Left greater than right body involvement  On Keppra 1.5g q 12h  If EEG neg for seizure, will decrease dose  EEG on hold now  Fever, resolved  WBC 20.8->17.8->19.1->18.9->13.2  Blood culture NGTD   UA TNTC   Urine Cx E.Coli  Sputum culture negative  Pt is on zosyn  Hypertension  Stable now  Hyperlipidemia  No statin at home  LDL 81, goal < 70  Add lipitor 10mg    Other Stroke Risk Factors  Former Cigarette smoker, quit smoking 47 years ago   ETOH use  Marijuana use  Obesity, Body mass index is 32.66 kg/(m^2).   Family hx stroke (father)  Other Active Problems  History of Hodgkin's lymphoma  History of hepatitis, type unknown  History of spinal cord schwannoma  Hypothyroidism  Hospital day # 9  This patient is critically ill due to left frontal ICH with mass effect s/p evacuation and EVD, suspicious for brain mass, left DVT, fever and at significant risk of neurological worsening, death form CNS infection, recurrent bleeding, cerebral edema and brain herniation as well as PE. This patient's care requires constant monitoring of vital signs, hemodynamics, respiratory and cardiac monitoring, review of multiple databases, neurological assessment, discussion with family, other specialists and medical decision making of high complexity. I spent 40 minutes of neurocritical care time in the care of this patient.   Rosalin Hawking, MD PhD Stroke Neurology 08/27/2015 7:09  PM     To contact Stroke Continuity provider, please refer to [email or web address] After hours, contact General Neurology

## 2015-08-27 NOTE — Progress Notes (Signed)
PULMONARY / CRITICAL CARE MEDICINE   Name: Sylvia Moore MRN: [id number] DOB: May 13, 1950    ADMISSION DATE:  08/18/2015  REFERRING MD:  ER  CHIEF COMPLAINT:  Headache  SUBJECTIVE:  Tolerating pressure support.  VITAL SIGNS: BP 108/55 mmHg  Pulse 81  Temp(Src) 98.9 F (37.2 C) (Axillary)  Resp 32  Ht 5\' 8"  (1.727 m)  Wt 214 lb 11.7 oz (97.4 kg)  BMI 32.66 kg/m2  SpO2 100%  VENTILATOR SETTINGS: Vent Mode:  [-] CPAP;PSV FiO2 (%):  [30 %] 30 % Set Rate:  [18 bmp] 18 bmp Vt Set:  [500 mL] 500 mL PEEP:  [5 cmH20] 5 cmH20 Pressure Support:  [5 cmH20] 5 cmH20 Plateau Pressure:  [19 cmH20-28 cmH20] 28 cmH20  INTAKE / OUTPUT: I/O last 3 completed shifts: In: 3145 [I.V.:1800; NG/GT:750; IV Piggyback:595] Out: [id number] [Urine:1340]  PHYSICAL EXAMINATION: General: more alert Neuro:  Moves Lt side, opens eyes spontaneously HEENT:  ETT in place Cardiovascular:  Regular Lungs: no wheeze Abdomen:  Soft, non tender Musculoskeletal:  1+ edema Skin:  No rashes  LABS:  BMET  Recent Labs Lab 08/25/15 0540 08/26/15 0500 08/27/15 0500  NA 135 138 140  K 3.5 3.2* 3.2*  CL 101 103 106  CO2 25 27 25   BUN 24* 25* 25*  CREATININE 0.62 0.66 0.70  GLUCOSE 103* 110* 107*    Electrolytes  Recent Labs Lab 08/23/15 0200 08/24/15 0520 08/25/15 0540 08/26/15 0500 08/27/15 0500  CALCIUM 8.8* 8.8* 8.7* 8.8* 8.8*  MG 1.9 1.9 1.9  --   --   PHOS 3.2 3.4 3.3  --   --     CBC  Recent Labs Lab 08/24/15 0520 08/26/15 0500 08/26/15 1755 08/27/15 0500  WBC 18.9* 13.2*  --  18.9*  HGB 9.0* 7.7* 8.0* 7.6*  HCT 27.5* 23.5* 23.7* 23.4*  PLT 393 548*  --  625*    Coag's No results for input(s): APTT, INR in the last 168 hours.  Sepsis Markers  Recent Labs Lab 08/23/15 1005 08/24/15 0520 08/25/15 0540  LATICACIDVEN 1.2  --   --   PROCALCITON 0.17 0.21 0.23    ABG No results for input(s): PHART, PCO2ART, PO2ART in the last 168 hours.  Liver Enzymes No results  for input(s): AST, ALT, ALKPHOS, BILITOT, ALBUMIN in the last 168 hours.  Cardiac Enzymes No results for input(s): TROPONINI, PROBNP in the last 168 hours.  Glucose  Recent Labs Lab 08/26/15 0738 08/26/15 1538 08/26/15 1923 08/26/15 2322 08/27/15 0317 08/27/15 0742  GLUCAP 102* 76 90 106* 109* 96    Imaging Ir Ivc Filter Plmt / S&i /img Guid/mod Sed  08/26/2015  INDICATION: Lower extremity DVT.  Intracranial hemorrhage. EXAM: IVC FILTER,INFERIOR VENA CAVOGRAM MEDICATIONS: None. ANESTHESIA/SEDATION: Fentanyl 50 mcg IV; Versed 1 mg IV Moderate Sedation Time:  15 minutes The patient was continuously monitored during the procedure by the interventional radiology nurse under my direct supervision. FLUOROSCOPY TIME:  Fluoroscopy Time: 1 minutes 48 seconds (37 mGy). COMPLICATIONS: None immediate. PROCEDURE: Informed written consent was obtained from the patient after a thorough discussion of the procedural risks, benefits and alternatives. All questions were addressed. Maximal Sterile Barrier Technique was utilized including caps, mask, sterile gowns, sterile gloves, sterile drape, hand hygiene and skin antiseptic. A timeout was performed prior to the initiation of the procedure. The right neck was prepped with Betadine in a sterile fashion, and a sterile drape was applied covering the operative field. A sterile gown and sterile gloves were used  for the procedure. The right jugular vein was noted to be patent initially with ultrasound. Under sonographic guidance, a micropuncture needle was inserted into the right jugular vein (Ultrasound image documentation was performed). It was removed over an 018 wire which was up-sized to a [place]. The sheath was inserted over the wire and into the IVC. IVC venography was performed. The temporary filter was then deployed in the infrarenal IVC. The sheath was removed and hemostasis was achieved with direct pressure. FINDINGS: IVC venography confirms patency of the  IVC and no venous anomaly. Renal vein inflow occurs at L2 The spot image demonstrates an IVC filter placed with its tip at the L2 inferior endplate. IMPRESSION: Successful infrarenal IVC filter placement. This is a temporary filter. It can be removed or remain in place to become permanent. Electronically Signed   By: Marybelle Killings M.D.   On: 08/26/2015 13:52     STUDIES:  4/18 CT head >> 3 cm Lt frontal hematoma with SAH and SDH, 6 mm shift 4/24 CT head >> Lt MCA infarct, cytotoxic edema, 7 mm shift 4/24 Echo >> EF 30 to 35%, diffuse hypokinesis, mod MR, PAS 54 mmHg 4/25 Doppler legs b/l >> Lt soleal vein DVT  CULTURES: 4/22 Blood >> Coag neg Staph  4/22 Sputum >> Multiple organisms, none predominant 4/23 Urine >> E coli 4/23 Blood >>  ANTIBIOTICS: 4/23 Vancomycin >> 4/25 4/23 Zosyn >>   SIGNIFICANT EVENTS: 4/18 Admit, neurology/neurosurgery consulted; Lt frontotemporal craniotomy with evacuation of SDH and ICH with biopsy 4/23 Fever (101.5) >> start Abx 4/26 IVC filter placed  LINES/TUBES: 4/18 ETT >> 4/18 Rt IJ CVL >>  DISCUSSION: 65 yo female former smoker with vomiting, seizure, slurred speech and altered mental status from Lt ICH.  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  ASSESSMENT / PLAN:  PULMONARY A: Compromised airway in setting of ICH. 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 Seizure. 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 Hx of LBBB, HTN, HLD. Lt leg DVT >> no anticoagulation in setting of ICH/SAH/SDH. P:  Goal SBP < 160, prn labetalol Continue coreg Hold outpt lasix, diovan, corlanor for now For IVC filter by IR 4/26  RENAL A:    Hypokalemia. P:   Monitor renal fx, urine outpt Replace electrolytes as needed  GASTROINTESTINAL A:   Nutrition. P:   Tube feeds while on vent Protonix for SUP  HEMATOLOGIC A:   Anemia of critical illness. Leukocytosis. P:  F/u CBC SCDs for DVT prevention Check iron levels  INFECTIOUS A:   Fever with E coli UTI. Coag neg Staph in 1 blood culture >> likely contaminate. P:   Day 5/5 of zosyn   ENDOCRINE A:   Hx of hypothyroidism, gout. P:   Continue synthroid Hold outpt allopurinol   CC time 31 minutes.  [name], MD [facility] Pulmonary/Critical Care 08/27/2015, 8:09 AM Pager:  [phone] After 3pm call: [phone]

## 2015-08-27 NOTE — Procedures (Signed)
Intubation Procedure Note Sylvia Moore [id number] 1951-04-18  Procedure: Intubation Indications: Respiratory insufficiency  Procedure Details Consent: Risks of procedure as well as the alternatives and risks of each were explained to the (patient/caregiver).  Consent for procedure obtained. Time Out: Verified patient identification, verified procedure, site/side was marked, verified correct patient position, special equipment/implants available, medications/allergies/relevent history reviewed, required imaging and test results available.  Performed  Maximum sterile technique was used including gloves and hand hygiene.  MAC and 3  Developed stridor after extubation.  Did not improve with BiPAP and solumedrol.  Family report hx of severe GERD.  Given 4 mg versed, 100 mcg fentanyl, 10 mg etomidate.  Attempted initially with glidescope, but difficult to navigate ETT to vocal cords >> had good visualization.  Had transient drop in SpO2 to 50's.  Bagged up to 90's SpO2.  Changed to #3 laryngoscope.  Inserted 7.0 ETT to 23 cm at lip.  Confirmed with ausculation and CO2 detector.   Evaluation Hemodynamic Status: BP stable throughout; O2 sats: transiently fell during during procedure Patient's Current Condition: stable Complications: No apparent complications Patient did tolerate procedure well. Chest X-ray ordered to verify placement.  CXR: pending.   Sylvia Mires, MD [facility] Pulmonary/Critical Care 08/27/2015, 10:02 AM Pager:  [phone] After 3pm call: [phone]

## 2015-08-27 NOTE — Progress Notes (Signed)
Patient ID: Sylvia Moore, female   DOB: [birth year], 65 y.o.   MRN: [id number] Neuro stable was extubated but can not keep up with breathing. Having stridors. To be reintubated. Path report pending. Spoke with family

## 2015-08-27 NOTE — Procedures (Signed)
Extubation Procedure Note  Patient Details:   Name: Sylvia Moore DOB: [birth year] MRN: [id number]   Airway Documentation:  Airway 7.5 mm (Active)  Secured at (cm) 24 cm 08/27/2015  8:01 AM  Measured From Lips 08/27/2015  8:01 AM  [place] 08/27/2015  8:01 AM  Secured By [employer] 08/27/2015  8:01 AM  Tube Holder Repositioned Yes 08/27/2015  8:01 AM  Cuff Pressure (cm H2O) 20 cm H2O 08/27/2015  8:01 AM  Site Condition Dry 08/27/2015  4:02 AM    Evaluation  O2 sats: stable throughout Complications: Complications of stridor Patient did not tolerate procedure well. Bilateral Breath Sounds: Clear   No   Pt negative for cuff leak. Per MD okay to extubate.   Hope Pigeon, [place] 08/27/2015, 9:02 AM

## 2015-08-28 ENCOUNTER — Inpatient Hospital Stay (HOSPITAL_COMMUNITY): Payer: BC Managed Care – PPO

## 2015-08-28 LAB — CULTURE, BLOOD (ROUTINE X 2)
CULTURE: NO GROWTH
Culture: NO GROWTH

## 2015-08-28 LAB — BASIC METABOLIC PANEL
Anion gap: 8 (ref 5–15)
BUN: 27 mg/dL — ABNORMAL HIGH (ref 6–20)
CALCIUM: 9.2 mg/dL (ref 8.9–10.3)
CO2: 25 mmol/L (ref 22–32)
CREATININE: 0.67 mg/dL (ref 0.44–1.00)
Chloride: 109 mmol/L (ref 101–111)
GFR calc non Af Amer: >60 mL/min (ref >60)
Glucose, Bld: 144 mg/dL — ABNORMAL HIGH (ref 65–99)
Potassium: 3.7 mmol/L (ref 3.5–5.1)
SODIUM: 142 mmol/L (ref 135–145)

## 2015-08-28 LAB — CBC
HCT: 24.7 % — ABNORMAL LOW (ref 36.0–46.0)
Hemoglobin: 8.1 g/dL — ABNORMAL LOW (ref 12.0–15.0)
MCH: 33.5 pg (ref 26.0–34.0)
MCHC: 32.8 g/dL (ref 30.0–36.0)
MCV: 102.1 fL — ABNORMAL HIGH (ref 78.0–100.0)
PLATELETS: 726 10*3/uL — AB (ref 150–400)
RBC: 2.42 MIL/uL — AB (ref 3.87–5.11)
RDW: 13.3 % (ref 11.5–15.5)
WBC: 22.8 10*3/uL — AB (ref 4.0–10.5)

## 2015-08-28 LAB — GLUCOSE, CAPILLARY
Glucose-Capillary: 134 mg/dL — ABNORMAL HIGH (ref 65–99)
Glucose-Capillary: 134 mg/dL — ABNORMAL HIGH (ref 65–99)
Glucose-Capillary: 139 mg/dL — ABNORMAL HIGH (ref 65–99)
Glucose-Capillary: 127 mg/dL — ABNORMAL HIGH (ref 65–99)
Glucose-Capillary: 125 mg/dL — ABNORMAL HIGH (ref 65–99)

## 2015-08-28 MED ORDER — ATORVASTATIN CALCIUM 10 MG PO TABS
10.0000 mg | ORAL_TABLET | Freq: Every day | ORAL | Status: DC
  Administered 2015-08-28 – 2015-08-30 (×3): 10 mg
  Filled 2015-08-28 (×3): qty 1

## 2015-08-28 MED ORDER — METHYLPREDNISOLONE SODIUM SUCC 40 MG IJ SOLR
40.0000 mg | Freq: Four times a day (QID) | INTRAMUSCULAR | Status: AC
  Administered 2015-08-28 – 2015-08-29 (×4): 40 mg via INTRAVENOUS
  Filled 2015-08-28 (×4): qty 1

## 2015-08-28 MED ORDER — FUROSEMIDE 10 MG/ML PO SOLN
20.0000 mg | Freq: Every day | ORAL | Status: DC
  Administered 2015-08-28 – 2015-08-30 (×3): 20 mg
  Filled 2015-08-28 (×4): qty 2

## 2015-08-28 MED ORDER — POTASSIUM CHLORIDE 20 MEQ/15ML (10%) PO SOLN
40.0000 meq | Freq: Once | ORAL | Status: AC
  Administered 2015-08-28: 40 meq via ORAL
  Filled 2015-08-28: qty 30

## 2015-08-28 MED ORDER — ACETAMINOPHEN 160 MG/5ML PO SOLN: 650.0000 mg | Freq: Four times a day (QID) | ORAL | Status: DC | PRN

## 2015-08-28 NOTE — Progress Notes (Signed)
PULMONARY / CRITICAL CARE MEDICINE   Name: Sylvia Moore MRN: [id number] DOB: 25-Mar-1951    ADMISSION DATE:  08/18/2015  REFERRING MD:  ER  CHIEF COMPLAINT:  Headache  SUBJECTIVE:  Tolerating pressure support.  No cuff leak.  VITAL SIGNS: BP 131/69 mmHg  Pulse 93  Temp(Src) 99 F (37.2 C) (Oral)  Resp 20  Ht 5\' 8"  (1.727 m)  Wt 211 lb 3.2 oz (95.8 kg)  BMI 32.12 kg/m2  SpO2 100%  VENTILATOR SETTINGS: Vent Mode:  [-] PSV FiO2 (%):  [40 %-100 %] 40 % Set Rate:  [18 bmp] 18 bmp Vt Set:  [500 mL] 500 mL PEEP:  [5 cmH20] 5 cmH20 Pressure Support:  [10 cmH20] 10 cmH20 Plateau Pressure:  [18 cmH20-28 cmH20] 19 cmH20  INTAKE / OUTPUT: I/O last 3 completed shifts: In: 3363.7 [I.V.:1850; NG/GT:803.7; IV Piggyback:710] Out: 700 [Stool:700]  PHYSICAL EXAMINATION: General: alert Neuro: following commands HEENT:  ETT in place Cardiovascular:  Regular Lungs: no wheeze Abdomen:  Soft, non tender Musculoskeletal:  1+ edema Skin:  No rashes  LABS:  BMET  Recent Labs Lab 08/26/15 0500 08/27/15 0500 08/27/15 1136 08/28/15 0610  NA 138 140 140 142  K 3.2* 3.2* 3.8 3.7  CL 103 106  --  109  CO2 27 25  --  25  BUN 25* 25*  --  27*  CREATININE 0.66 0.70  --  0.67  GLUCOSE 110* 107*  --  144*    Electrolytes  Recent Labs Lab 08/23/15 0200 08/24/15 0520 08/25/15 0540 08/26/15 0500 08/27/15 0500 08/28/15 0610  CALCIUM 8.8* 8.8* 8.7* 8.8* 8.8* 9.2  MG 1.9 1.9 1.9  --   --   --   PHOS 3.2 3.4 3.3  --   --   --     CBC  Recent Labs Lab 08/26/15 0500  08/27/15 0500 08/27/15 1136 08/28/15 0610  WBC 13.2*  --  18.9*  --  22.8*  HGB 7.7*  < > 7.6* 7.5* 8.1*  HCT 23.5*  < > 23.4* 22.0* 24.7*  PLT 548*  --  625*  --  726*  < > = values in this interval not displayed.  Coag's No results for input(s): APTT, INR in the last 168 hours.  Sepsis Markers  Recent Labs Lab 08/23/15 1005 08/24/15 0520 08/25/15 0540  LATICACIDVEN 1.2  --   --   PROCALCITON  0.17 0.21 0.23    ABG No results for input(s): PHART, PCO2ART, PO2ART in the last 168 hours.  Liver Enzymes No results for input(s): AST, ALT, ALKPHOS, BILITOT, ALBUMIN in the last 168 hours.  Cardiac Enzymes No results for input(s): TROPONINI, PROBNP in the last 168 hours.  Glucose  Recent Labs Lab 08/27/15 1134 08/27/15 1526 08/27/15 1934 08/27/15 2335 08/28/15 0346 08/28/15 0742  GLUCAP 115* 104* 111* 130* 134* 134*    Imaging Dg Chest Port 1 View  08/28/2015  CLINICAL DATA:  Respiratory failure. EXAM: PORTABLE CHEST 1 VIEW COMPARISON:  08/27/2015, 08/25/2015 and 08/23/2015 FINDINGS: Endotracheal tube, feeding tube and central venous catheter appear in good position. AICD in place. Heart size and pulmonary vascularity are normal. The pulmonary edema on the prior study has resolved. Minimal atelectasis the lung bases. Probable small bilateral effusions. No acute bone abnormality. IMPRESSION: Resolution of pulmonary edema. Persistent bibasilar atelectasis. Probable new small pleural effusions. Electronically Signed   By: Lorriane Shire M.D.   On: 08/28/2015 07:40   Dg Chest Port 1 View  08/27/2015  CLINICAL DATA:  Endotracheal tube placement EXAM: PORTABLE CHEST 1 VIEW COMPARISON:  08/27/2015 FINDINGS: Endotracheal tube is 4 cm above the carina in good position. Right jugular central venous catheter tip in the lower SVC unchanged. No pneumothorax. Dual lead pacemaker/ AICD unchanged. Progression of bilateral airspace disease.  No effusion IMPRESSION: Progression of bilateral airspace disease which may represent edema or pneumonia. Electronically Signed   By: Franchot Gallo M.D.   On: 08/27/2015 10:32   Dg Abd Portable 1v  08/27/2015  CLINICAL DATA:  Feeding tube placement EXAM: PORTABLE ABDOMEN - 1 VIEW COMPARISON:  None. FINDINGS: Weighted feeding tube terminates in the distal 2nd portion of the duodenum. Nonobstructive bowel gas pattern. Surgical clips in the left mid abdomen. IVC  filter. Lower thoracolumbar surgical fixation. IMPRESSION: Weighted feeding tube terminates in the distal 2nd portion of the duodenum. Electronically Signed   By: Julian Hy M.D.   On: 08/27/2015 16:19     STUDIES:  4/18 CT head >> 3 cm Lt frontal hematoma with SAH and SDH, 6 mm shift 4/24 CT head >> Lt MCA infarct, cytotoxic edema, 7 mm shift 4/24 Echo >> EF 30 to 35%, diffuse hypokinesis, mod MR, PAS 54 mmHg 4/25 Doppler legs b/l >> Lt soleal vein DVT  CULTURES: 4/22 Blood >> Coag neg Staph  4/22 Sputum >> Multiple organisms, none predominant 4/23 Urine >> E coli 4/23 Blood >> 4/27 C diff >> negative  ANTIBIOTICS: 4/23 Vancomycin >> 4/25 4/23 Zosyn >> 4/28  SIGNIFICANT EVENTS: 4/18 Admit, neurology/neurosurgery consulted; Lt frontotemporal craniotomy with evacuation of SDH and ICH with biopsy 4/23 Fever (101.5) >> start Abx 4/26 IVC filter placed 4/27 Extubated >> stridor >> re intubated and started solumedrol/BID protonix  LINES/TUBES: 4/18 ETT >> 4/27 4/18 Rt IJ CVL >> 4/27 ETT >>   DISCUSSION: 65 yo female former smoker with vomiting, seizure, slurred speech and altered mental status from Lt ICH.  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  ASSESSMENT / PLAN:  PULMONARY A: Compromised airway in setting of ICH. Post-extubation stridor. 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 Seizure. P:   Monitor mental status >> improved 4/28 F/u brain bx  AEDs per neurology Hold outpt effexor  CARDIOVASCULAR A:  Hx of NICM after chemotherapy with chronic systolic CHF. Hx of LBBB, HTN, HLD. Lt leg DVT >> no anticoagulation in setting of ICH/SAH/SDH >> s/p IVC filter 4/26. P:  Goal SBP < 160,  prn labetalol Continue coreg, lipitor Resume lasix 4/28 Hold outpt diovan, corlanor for now  RENAL A:   Hypokalemia. P:   Monitor renal fx, urine outpt Replace electrolytes as needed  GASTROINTESTINAL A:   Nutrition. Hx of GERD >> might be contributing to upper airway edema. P:   Tube feeds while on vent Continue BID protonix  HEMATOLOGIC A:   Anemia of critical illness. Leukocytosis. P:  F/u CBC SCDs for DVT prevention  INFECTIOUS A:   Fever with E coli UTI >> completed abx 4/28. Coag neg Staph in 1 blood culture >> likely contaminate. P:   Monitor off Abx  ENDOCRINE A:   Hx of hypothyroidism, gout. P:   Continue synthroid Hold outpt allopurinol  Updated pts wife at bedside.  CC time 2  minutes.  [name], MD [facility] Pulmonary/Critical Care 08/28/2015, 9:31 AM Pager:  [phone] After 3pm call: [phone]

## 2015-08-28 NOTE — Progress Notes (Signed)
STROKE TEAM PROGRESS NOTE   SUBJECTIVE (INTERVAL HISTORY) Her wife and daughter are at the bedside.  Patient still intubated but eyes open and interactive.    OBJECTIVE Temp:  [98.3 F (36.8 C)-99.8 F (37.7 C)] 99.3 F (37.4 C) (04/28 1650) Pulse Rate:  [79-100] 87 (04/28 1511) Cardiac Rhythm:  [-] Normal sinus rhythm (04/28 0800) Resp:  [15-28] 22 (04/28 1511) BP: (97-139)/(52-86) 126/65 mmHg (04/28 1511) SpO2:  [99 %-100 %] 99 % (04/28 1511) FiO2 (%):  [40 %] 40 % (04/28 1511) Weight:  [211 lb 3.2 oz (95.8 kg)] 211 lb 3.2 oz (95.8 kg) (04/28 0500)  CBC:   Recent Labs Lab 08/23/15 0200 08/24/15 0520  08/27/15 0500 08/27/15 1136 08/28/15 0610  WBC 19.1* 18.9*  < > 18.9*  --  22.8*  NEUTROABS 15.0* 14.5*  --   --   --   --   HGB 9.3* 9.0*  < > 7.6* 7.5* 8.1*  HCT 28.7* 27.5*  < > 23.4* 22.0* 24.7*  MCV 100.3* 100.4*  < > 101.3*  --  102.1*  PLT 370 393  < > 625*  --  726*  < > = values in this interval not displayed.  Basic Metabolic Panel:   Recent Labs Lab 08/24/15 0520 08/25/15 0540  08/27/15 0500 08/27/15 1136 08/28/15 0610  NA 137 135  < > 140 140 142  K 4.0 3.5  < > 3.2* 3.8 3.7  CL 104 101  < > 106  --  109  CO2 24 25  < > 25  --  25  GLUCOSE 126* 103*  < > 107*  --  144*  BUN 21* 24*  < > 25*  --  27*  CREATININE 0.57 0.62  < > 0.70  --  0.67  CALCIUM 8.8* 8.7*  < > 8.8*  --  9.2  MG 1.9 1.9  --   --   --   --   PHOS 3.4 3.3  --   --   --   --   < > = values in this interval not displayed.  Lipid Panel:     Component Value Date/Time   CHOL 123 08/25/2015 0540   TRIG 103 08/25/2015 0540   HDL 21* 08/25/2015 0540   CHOLHDL 5.9 08/25/2015 0540   VLDL 21 08/25/2015 0540   LDLCALC 81 08/25/2015 0540   HgbA1c:  Lab Results  Component Value Date   HGBA1C 5.7* 08/25/2015   Urine Drug Screen:     Component Value Date/Time   LABOPIA NONE DETECTED 08/18/2015 1044   COCAINSCRNUR NONE DETECTED 08/18/2015 1044   LABBENZ NONE DETECTED 08/18/2015  1044   AMPHETMU NONE DETECTED 08/18/2015 1044   THCU NONE DETECTED 08/18/2015 1044   LABBARB NONE DETECTED 08/18/2015 1044      IMAGING I have personally reviewed the radiological images below and agree with the radiology interpretations.  Ct Head Wo Contrast 08/24/2015 IMPRESSION: 1. Stable moderate subarachnoid hemorrhage on the left. 2. Left MCA territory infarct. Cytotoxic edema results in increased midline shift, now 7 mm. 3. EVD with no ventriculomegaly.  08/19/2015  Interval LEFT frontotemporal craniotomy for evacuation of temporal lobe hematoma and subdural hematoma. Moderate residual LEFT subarachnoid hemorrhage. Trace LEFT tentorial subdural hematoma. Improved, residual 3 mm LEFT to RIGHT midline shift.  08/18/2015   ~ 3cm left frontal parenchymal hematoma with subarachnoid and subdural extension. There may be an underlying mass and CT with and without contrast is recommended. 6 mm midline shift.  08/18/2015  Large area of hemorrhage in the left hemisphere has progressed since 1 hour ago. There is parenchymal hemorrhage with active extravasation in the left frontal operculum. There is also increase in left hemispheric subdural hemorrhage. There is a small amount of subarachnoid hemorrhage in the left sylvian fissure. Increase in low-density edema on the left frontal lobe. Increase in midline shift which is now 11 mm to the right   Ct Angio Head & Neck W/cm &/or Wo/cm 08/18/2015   Negative for vascular malformation. No aneurysm or AVM is seen to account for the large left frontal temporal hematoma. Large hematoma left frontal temporal lobe is stable since the recent CT scan. Extravasated contrast shows improvement since the prior study. Mild amount of subarachnoid hemorrhage. 6 mm left subdural hematoma is stable. There is increased edema in the left hemisphere with increased mass-effect on the lateral ventricle. 11 mm midline shift is unchanged.   Ct Chest Abdomen Pelvis W Contrast 08/18/2015    1. No evidence of primary malignancy or metastatic disease in the chest or abdomen. 2. 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 3. Low volume chest with bilateral atelectasis. 4. Chronic findings are stable   Dg Chest [facility] 08/19/2015   Increased left basilar subsegmental atelectasis. Stable support apparatus.  08/18/2015  1. New central line in unremarkable position.  No pneumothorax. 2. Low volume chest with basilar atelectasis.  08/18/2015   Bibasilar atelectasis. Tube positions as above.   2D echo - - Left ventricle: The cavity size was mildly dilated. Wall  thickness was normal. Systolic function was moderately to  severely reduced. The estimated ejection fraction was in the  range of 30% to 35%. Diffuse hypokinesis. Doppler parameters are  consistent with restrictive physiology, indicative of decreased  left ventricular diastolic compliance and/or increased left  atrial pressure. - Aortic valve: There was trivial regurgitation. - Mitral valve: There was moderate regurgitation. - Pulmonary arteries: Systolic pressure was moderately increased.  PA peak pressure: 54 mm Hg (S). Impressions: - Moderate to severe global reduction in LV function; restrictive  LV filling; sclerotic aortic valve with trace AI; moderate MR;  mild TR with moderately elevated pulmonary pressure; possible  patent foramen ovale noted on subcostal images.  LE venous doppler - DVT noted in the left soleal vein. No DVT RLE.   PHYSICAL EXAM Middle-aged Caucasian lady again intubated on sedation. Head is nontraumatic. Neck is supple without bruit. Cardiac exam no murmur or gallop. Lungs are clear to auscultation. Distal pulses are well felt. Head dressing. Left eye periorbitall swelling post craniotomy.  Neurological Exam  Patient still intubated but eyes open and tracking but does not follow any commands. Pupils are both 2 mm reactive. Corneal reflexes  are present. Fundi cannot be visualized. Facial difficult to assess due to ET tube. Spontaneous movements on left upper and lower extremity. Does not move the right side to painful stimuli except slight movement in the right leg. Purposeful withdrawal on the left leg and arm pain. Right plantar upgoing left downgoing.  ASSESSMENT/PLAN Ms. MAKYNLEE QUINDE is a 65 y.o. female with history of CHF, schwannoma of spinal cord, orthostatic HTN, GERD, L BBB, Hepatitis and Hodgkin's lymphoma presenting with altered mental status and dysarthria.   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 Suspect hemorrhage secondary to right MCA infarct vs. underlying tumor. Pathology pending.   Resultant Right hemiplegia  and likely aphasia   Neurosurgery consulted Joya Salm) 4/19 crani with IVC placed, ? Tumor. Path pending   CT abdomen/chest/pelvis no evidence of metastatic disease. 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 L frontal lobe ICH w/ SAH, L SDH. Mass effect with 24mm midline shift  Repeat CT head - decreased midline shift now 33mm, but pattern more consistent with left MCA infarct with hemorrhagic transformation  TTE 30-35% down from EF 35-40% in 01/2015  EEG On hold due to scalp dressing   Need TCD bubble study to be done once more stable  Pacemaker interrogation showed no afib  LDL 81  A1C 5.7  subq heparin for VTE prophylaxis Diet NPO time specified  No antithrombotic prior to admission, no antithrombotic now due to [place]  Ongoing aggressive stroke risk factor management  Therapy recommendations:  pending   Disposition:  pending (has a 59 year-old daughter with mental retardation, unable to make decisions)  If source of hemorrhage is tumor, type of tumor will dictate prognosis.  Patient's neurologic  condition post extubation will also help guide plans  Chronic CHF  Has been following with Dr. Rayann Heman in clinic  Pacemaker in situ  EF 35-40% in 01/2015  Now EF down to 30-35%  CXR no pulmonary edema  Left LE DVT  Found on LE venous doppler  High risk for anticoagulation due to [place]  IVC filter today  Need TCD bubble study to rule out paradoxical emboli once pt more stable  Respiratory failure  Intubated in the ED following seizure from [facility]  Not tolerating extubate due to stridor  Re-intubated   CCM on board  Wean off as able  May consider trach if not able to wean off vent early next week  Seizure  Left greater than right body involvement  On Keppra 1.5g q 12h  If EEG neg for seizure, will decrease dose  EEG on hold now  Fever, resolved  WBC 20.8->17.8->19.1->18.9->13.2  Blood culture NGTD   UA TNTC   Urine Cx E.Coli  Sputum culture negative  Pt is on zosyn  Hypertension  Stable now  Hyperlipidemia  No statin at home  LDL 81, goal < 70  Add lipitor 10mg    Other Stroke Risk Factors  Former Cigarette smoker, quit smoking 47 years ago   ETOH use  Marijuana use  Obesity, Body mass index is 32.12 kg/(m^2).   Family hx stroke (father)  Other Active Problems  History of Hodgkin's lymphoma  History of hepatitis, type unknown  History of spinal cord schwannoma  Hypothyroidism  Hospital day # 10  This patient is critically ill due to left frontal ICH with mass effect s/p evacuation and EVD, suspicious for brain mass, left DVT, fever and at significant risk of neurological worsening, death form CNS infection, recurrent bleeding, cerebral edema and brain herniation as well as PE. This patient's care requires constant monitoring of vital signs, hemodynamics, respiratory and cardiac monitoring, review of multiple databases, neurological assessment, discussion with family, other specialists and medical decision making of high  complexity. I spent 35 minutes of neurocritical care time in the care of this patient.   Rosalin Hawking, MD PhD Stroke Neurology 08/28/2015 5:23 PM     To contact Stroke Continuity provider, please refer to [email or web address] After hours, contact General Neurology

## 2015-08-28 NOTE — Progress Notes (Addendum)
Pt pulled out cortrak tube and ETT by 1 cm, RT, [name] has re-positioned the ETT to its original readings and I will contact cortrak team to replace tube.  Pt alert and L arm still very active. No signs of discomfort or distress.  Spoke with [name] on [place] team, she will come whenever possible today to replace the NG tube.   Sylvia Moore

## 2015-08-28 NOTE — Progress Notes (Signed)
Patient ID: Sylvia Moore, female   DOB: 01/20/1951, 65 y.o.   MRN: [id number] Clinically more alert,tracks with eyes. Right hemiplegia. Wound dry. i did talk to the pathology section. Still no results from [facility]. Family aware

## 2015-08-28 NOTE — Progress Notes (Signed)
Nutrition Follow-up  DOCUMENTATION CODES:   Obesity unspecified  INTERVENTION:   Continue: Vital High Protein @ 20 ml/hr 60 ml Prostat TID Provides: 1080 kcal, 132 grams protein, and 401 ml H2O  NUTRITION DIAGNOSIS:   Inadequate oral intake related to inability to eat as evidenced by NPO status. Ongoing.   GOAL:   Provide needs based on ASPEN/SCCM guidelines Met.   MONITOR:   TF tolerance, I & O's, Vent status  REASON FOR ASSESSMENT:   Consult Enteral/tube feeding initiation and management  ASSESSMENT:   Pt with hx of CHF, HOH, depression, GERD admitted with left frontal parenchymal hematoma with subarachnoid and subdural extension s/p craniotomy, evacuation and resection of possible tumor.   Patient is currently intubated on ventilator support MV: 10.3 L/min Temp (24hrs), Avg:98.7 F (37.1 C), Min:97.4 F (36.3 C), Max:99.8 F (37.7 C)  Cortrak Medications reviewed and include: MVI, senokot-s CBG's: 134-139 Family at bedside.   Diet Order:  Diet NPO time specified  Skin:  Wound (see comment) (MASD to perineum and groin)  Last BM:  4/27 700 ml via rectal tube  Height:   Ht Readings from Last 1 Encounters:  08/18/15 _0  (1.727 m)    Weight:   Wt Readings from Last 1 Encounters:  08/28/15 211 lb 3.2 oz (95.8 kg)    Ideal Body Weight:  63.6 kg  BMI:  Body mass index is 32.12 kg/(m^2).  Estimated Nutritional Needs:   Kcal:  6294-7654  Protein:  >/= 127 grams   Fluid:  >2 L/day  EDUCATION NEEDS:   No education needs identified at this time  [place], [place], [place] Pager [phone] After Hours Pager

## 2015-08-29 ENCOUNTER — Inpatient Hospital Stay (HOSPITAL_COMMUNITY): Payer: BC Managed Care – PPO

## 2015-08-29 DIAGNOSIS — G935 Compression of brain: Secondary | ICD-10-CM

## 2015-08-29 LAB — BASIC METABOLIC PANEL
Anion gap: 9 (ref 5–15)
BUN: 33 mg/dL — AB (ref 6–20)
CHLORIDE: 111 mmol/L (ref 101–111)
CO2: 26 mmol/L (ref 22–32)
CREATININE: 0.65 mg/dL (ref 0.44–1.00)
Calcium: 9.4 mg/dL (ref 8.9–10.3)
GFR calc Af Amer: >60 mL/min (ref >60)
GFR calc non Af Amer: >60 mL/min (ref >60)
GLUCOSE: 144 mg/dL — AB (ref 65–99)
Potassium: 4 mmol/L (ref 3.5–5.1)
SODIUM: 146 mmol/L — AB (ref 135–145)

## 2015-08-29 LAB — CBC
HCT: 25.9 % — ABNORMAL LOW (ref 36.0–46.0)
Hemoglobin: 8.4 g/dL — ABNORMAL LOW (ref 12.0–15.0)
MCH: 33.9 pg (ref 26.0–34.0)
MCHC: 32.4 g/dL (ref 30.0–36.0)
MCV: 104.4 fL — AB (ref 78.0–100.0)
PLATELETS: 843 10*3/uL — AB (ref 150–400)
RBC: 2.48 MIL/uL — ABNORMAL LOW (ref 3.87–5.11)
RDW: 13.7 % (ref 11.5–15.5)
WBC: 24.5 10*3/uL — AB (ref 4.0–10.5)

## 2015-08-29 LAB — GLUCOSE, CAPILLARY
GLUCOSE-CAPILLARY: 140 mg/dL — AB (ref 65–99)
GLUCOSE-CAPILLARY: 120 mg/dL — AB (ref 65–99)
Glucose-Capillary: 124 mg/dL — ABNORMAL HIGH (ref 65–99)
Glucose-Capillary: 128 mg/dL — ABNORMAL HIGH (ref 65–99)
Glucose-Capillary: 137 mg/dL — ABNORMAL HIGH (ref 65–99)
Glucose-Capillary: 141 mg/dL — ABNORMAL HIGH (ref 65–99)

## 2015-08-29 MED ORDER — METHYLPREDNISOLONE SODIUM SUCC 40 MG IJ SOLR
40.0000 mg | Freq: Two times a day (BID) | INTRAMUSCULAR | Status: DC
  Administered 2015-08-29 – 2015-08-31 (×4): 40 mg via INTRAVENOUS
  Filled 2015-08-29 (×4): qty 1

## 2015-08-29 NOTE — Progress Notes (Signed)
Dr. Jimmy Footman notified that patient had a 25 beat run of vtach. Will monitor.

## 2015-08-29 NOTE — Progress Notes (Signed)
Patient ID: Sylvia Moore, female   DOB: 01/04/1951, 65 y.o.   MRN: [id number] BP 141/70 mmHg  Pulse 84  Temp(Src) 98.1 F (36.7 C) (Axillary)  Resp 31  Ht 5\' 8"  (1.727 m)  Wt 95.9 kg (211 lb 6.7 oz)  BMI 32.15 kg/m2  SpO2 100% Alert, not following commands Perrl, tracks Moves left side, flaccid on right side Wounds are clean, dry, without signs of infection.

## 2015-08-29 NOTE — Progress Notes (Signed)
STROKE TEAM PROGRESS NOTE   SUBJECTIVE (INTERVAL HISTORY) Her wife is at the bedside.  Patient still intubated but eyes open and interactive. WBC is elevated but afeb   OBJECTIVE Temp:  [97.7 F (36.5 C)-99.3 F (37.4 C)] 97.9 F (36.6 C) (04/29 0800) Pulse Rate:  [72-104] 85 (04/29 1100) Cardiac Rhythm:  [-] Normal sinus rhythm (04/29 0800) Resp:  [14-40] 31 (04/29 1100) BP: (98-150)/(54-90) 141/76 mmHg (04/29 1100) SpO2:  [97 %-100 %] 100 % (04/29 1100) FiO2 (%):  [40 %] 40 % (04/29 1100) Weight:  [211 lb 6.7 oz (95.9 kg)] 211 lb 6.7 oz (95.9 kg) (04/29 0500)  CBC:   Recent Labs Lab 08/23/15 0200 08/24/15 0520  08/28/15 0610 08/29/15 0545  WBC 19.1* 18.9*  < > 22.8* 24.5*  NEUTROABS 15.0* 14.5*  --   --   --   HGB 9.3* 9.0*  < > 8.1* 8.4*  HCT 28.7* 27.5*  < > 24.7* 25.9*  MCV 100.3* 100.4*  < > 102.1* 104.4*  PLT 370 393  < > 726* 843*  < > = values in this interval not displayed.  Basic Metabolic Panel:   Recent Labs Lab 08/24/15 0520 08/25/15 0540  08/28/15 0610 08/29/15 0545  NA 137 135  < > 142 146*  K 4.0 3.5  < > 3.7 4.0  CL 104 101  < > 109 111  CO2 24 25  < > 25 26  GLUCOSE 126* 103*  < > 144* 144*  BUN 21* 24*  < > 27* 33*  CREATININE 0.57 0.62  < > 0.67 0.65  CALCIUM 8.8* 8.7*  < > 9.2 9.4  MG 1.9 1.9  --   --   --   PHOS 3.4 3.3  --   --   --   < > = values in this interval not displayed.  Lipid Panel:     Component Value Date/Time   CHOL 123 08/25/2015 0540   TRIG 103 08/25/2015 0540   HDL 21* 08/25/2015 0540   CHOLHDL 5.9 08/25/2015 0540   VLDL 21 08/25/2015 0540   LDLCALC 81 08/25/2015 0540   HgbA1c:  Lab Results  Component Value Date   HGBA1C 5.7* 08/25/2015   Urine Drug Screen:     Component Value Date/Time   LABOPIA NONE DETECTED 08/18/2015 1044   COCAINSCRNUR NONE DETECTED 08/18/2015 1044   LABBENZ NONE DETECTED 08/18/2015 1044   AMPHETMU NONE DETECTED 08/18/2015 1044   THCU NONE DETECTED 08/18/2015 1044   LABBARB NONE  DETECTED 08/18/2015 1044      IMAGING I have personally reviewed the radiological images below and agree with the radiology interpretations.  Ct Head Wo Contrast 08/24/2015 IMPRESSION: 1. Stable moderate subarachnoid hemorrhage on the left. 2. Left MCA territory infarct. Cytotoxic edema results in increased midline shift, now 7 mm. 3. EVD with no ventriculomegaly.  08/19/2015  Interval LEFT frontotemporal craniotomy for evacuation of temporal lobe hematoma and subdural hematoma. Moderate residual LEFT subarachnoid hemorrhage. Trace LEFT tentorial subdural hematoma. Improved, residual 3 mm LEFT to RIGHT midline shift.  08/18/2015   ~ 3cm left frontal parenchymal hematoma with subarachnoid and subdural extension. There may be an underlying mass and CT with and without contrast is recommended. 6 mm midline shift.  08/18/2015  Large area of hemorrhage in the left hemisphere has progressed since 1 hour ago. There is parenchymal hemorrhage with active extravasation in the left frontal operculum. There is also increase in left hemispheric subdural hemorrhage. There is a small  amount of subarachnoid hemorrhage in the left sylvian fissure. Increase in low-density edema on the left frontal lobe. Increase in midline shift which is now 11 mm to the right   Ct Angio Head & Neck W/cm &/or Wo/cm 08/18/2015   Negative for vascular malformation. No aneurysm or AVM is seen to account for the large left frontal temporal hematoma. Large hematoma left frontal temporal lobe is stable since the recent CT scan. Extravasated contrast shows improvement since the prior study. Mild amount of subarachnoid hemorrhage. 6 mm left subdural hematoma is stable. There is increased edema in the left hemisphere with increased mass-effect on the lateral ventricle. 11 mm midline shift is unchanged.   Ct Chest Abdomen Pelvis W Contrast 08/18/2015   1. No evidence of primary malignancy or metastatic disease in the chest or abdomen. 2. 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 3. Low volume chest with bilateral atelectasis. 4. Chronic findings are stable   Dg Chest [facility] 08/19/2015   Increased left basilar subsegmental atelectasis. Stable support apparatus.  08/18/2015  1. New central line in unremarkable position.  No pneumothorax. 2. Low volume chest with basilar atelectasis.  08/18/2015   Bibasilar atelectasis. Tube positions as above.   2D echo - - Left ventricle: The cavity size was mildly dilated. Wall  thickness was normal. Systolic function was moderately to  severely reduced. The estimated ejection fraction was in the  range of 30% to 35%. Diffuse hypokinesis. Doppler parameters are  consistent with restrictive physiology, indicative of decreased  left ventricular diastolic compliance and/or increased left  atrial pressure. - Aortic valve: There was trivial regurgitation. - Mitral valve: There was moderate regurgitation. - Pulmonary arteries: Systolic pressure was moderately increased.  PA peak pressure: 54 mm Hg (S). Impressions: - Moderate to severe global reduction in LV function; restrictive  LV filling; sclerotic aortic valve with trace AI; moderate MR;  mild TR with moderately elevated pulmonary pressure; possible  patent foramen ovale noted on subcostal images.  LE venous doppler - DVT noted in the left soleal vein. No DVT RLE.   PHYSICAL EXAM Middle-aged Caucasian lady again intubated on sedation. Head is nontraumatic. Neck is supple without bruit. Cardiac exam no murmur or gallop. Lungs are clear to auscultation. Distal pulses are well felt. Head dressing. Left eye periorbitall swelling post craniotomy.  Neurological Exam  Patient still intubated but eyes open and tracking but does not follow any commands.aphsic globally Pupils are both 2 mm reactive. Corneal reflexes are present. Fundi cannot be visualized. Facial difficult to assess due to ET tube.  Spontaneous movements on left upper and lower extremity. Does not move the right side to painful stimuli except slight movement in the right leg. Purposeful withdrawal on the left leg and arm pain. Right plantar upgoing left downgoing.  ASSESSMENT/PLAN Ms. DALLANARA MOORING is a 65 y.o. female with history of CHF, schwannoma of spinal cord, orthostatic HTN, GERD, L BBB, Hepatitis and Hodgkin's lymphoma presenting with altered mental status and dysarthria.   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 Suspect hemorrhage secondary to right MCA infarct vs. underlying tumor. Pathology pending.   Resultant Right hemiplegia and likely aphasia   Neurosurgery consulted Joya Salm) 4/19 crani with IVC placed, ? Tumor. Path pending   CT abdomen/chest/pelvis no evidence of metastatic disease. 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 L frontal lobe ICH w/ SAH, L SDH. Mass effect with 108mm midline shift  Repeat CT head - decreased midline shift now 60mm, but pattern more consistent with left MCA infarct with hemorrhagic transformation  TTE 30-35% down from EF 35-40% in 01/2015  EEG On hold due to scalp dressing   Need TCD bubble study to be done once more stable  Pacemaker interrogation showed no afib  LDL 81  A1C 5.7  subq heparin for VTE prophylaxis Diet NPO time specified  No antithrombotic prior to admission, no antithrombotic now due to [place]  Ongoing aggressive stroke risk factor management  Therapy recommendations:  pending   Disposition:  pending (has a 80 year-old daughter with mental retardation, unable to make decisions)  If source of hemorrhage is tumor, type of tumor will dictate prognosis.  Patient's neurologic condition post extubation will also help guide plans  Chronic CHF  Has been following  with Dr. Rayann Heman in clinic  Pacemaker in situ  EF 35-40% in 01/2015  Now EF down to 30-35%  CXR no pulmonary edema  Left LE DVT  Found on LE venous doppler  High risk for anticoagulation due to [place]  IVC filter today  Need TCD bubble study to rule out paradoxical emboli once pt more stable  Respiratory failure  Intubated in the ED following seizure from [facility]  Not tolerating extubate due to stridor  Re-intubated   CCM on board  Wean off as able  May consider trach if not able to wean off vent early next week  Seizure  Left greater than right body involvement  On Keppra 1.5g q 12h  If EEG neg for seizure, will decrease dose  EEG on hold now  Fever, resolved  WBC 20.8->17.8->19.1->18.9->13.2  Blood culture NGTD   UA TNTC   Urine Cx E.Coli  Sputum culture negative  Pt is on zosyn  Hypertension  Stable now  Hyperlipidemia  No statin at home  LDL 81, goal < 70  Add lipitor 10mg    Other Stroke Risk Factors  Former Cigarette smoker, quit smoking 47 years ago   ETOH use  Marijuana use  Obesity, Body mass index is 32.15 kg/(m^2).   Family hx stroke (father)  Other Active Problems  History of Hodgkin's lymphoma  History of hepatitis, type unknown  History of spinal cord schwannoma  Hypothyroidism  Hospital day # 41 I had a long discussion with the bedside with the patient's wife regarding her prognosis which appears guarded given significant aphasia and dense right hemiplegia. The pathology of brain lesion is not yet back from [facility] and is unclear as to when the report will be back. The patient's wife informs me that she is clear that patient would not want tracheostomy, prolonged ventilatory support and lives in a nursing home.She needs to make a definite decision about comfort care versus tracheostomy before  Patient is given another trial of extubation. This patient is critically ill due to left frontal ICH with mass effect s/p  evacuation and EVD, suspicious for brain mass, left DVT, fever and at significant risk of neurological worsening, death form CNS infection, recurrent bleeding, cerebral edema and brain herniation as well as PE. This patient's care requires constant monitoring of vital signs, hemodynamics, respiratory and cardiac monitoring, review of multiple databases, neurological assessment, discussion with family, other specialists and medical decision making of high complexity. I spent 32 minutes of neurocritical care time  in the care of this patient.   Antony Contras, MD Stroke Neurology 08/29/2015 11:25 AM     To contact Stroke Continuity provider, please refer to [email or web address] After hours, contact General Neurology

## 2015-08-29 NOTE — Progress Notes (Signed)
PULMONARY / CRITICAL CARE MEDICINE   Name: Sylvia Moore MRN: [id number] DOB: [birth year]    ADMISSION DATE:  08/18/2015  REFERRING MD:  ER  CHIEF COMPLAINT:  Headache  SUBJECTIVE:  Tolerating pressure support.  VITAL SIGNS: BP 141/70 mmHg  Pulse 84  Temp(Src) 98.1 F (36.7 C) (Axillary)  Resp 31  Ht 5\' 8"  (1.727 m)  Wt 95.9 kg (211 lb 6.7 oz)  BMI 32.15 kg/m2  SpO2 100%  VENTILATOR SETTINGS: Vent Mode:  [-] PSV;CPAP FiO2 (%):  [40 %] 40 % Set Rate:  [14 bmp] 14 bmp Vt Set:  [500 mL] 500 mL PEEP:  [5 cmH20] 5 cmH20 Pressure Support:  [5 cmH20-10 cmH20] 10 cmH20 Plateau Pressure:  [17 cmH20-21 cmH20] 18 cmH20  INTAKE / OUTPUT: I/O last 3 completed shifts: In: 2333 [I.V.:948; NG/GT:940; IV Piggyback:445] Out: 1200 [Stool:1200]  PHYSICAL EXAMINATION: General: awake, moves R arm spontaneously HENT: ETT in place PULM CTA B, vent supported breaths CV: RRR, no mgr GI: BS+, soft, nontender MSK: normal bulk and tone Neuro: Awake, doesn't follow commands for me  LABS:  BMET  Recent Labs Lab 08/27/15 0500 08/27/15 1136 08/28/15 0610 08/29/15 0545  NA 140 140 142 146*  K 3.2* 3.8 3.7 4.0  CL 106  --  109 111  CO2 25  --  25 26  BUN 25*  --  27* 33*  CREATININE 0.70  --  0.67 0.65  GLUCOSE 107*  --  144* 144*    Electrolytes  Recent Labs Lab 08/23/15 0200 08/24/15 0520 08/25/15 0540  08/27/15 0500 08/28/15 0610 08/29/15 0545  CALCIUM 8.8* 8.8* 8.7*  < > 8.8* 9.2 9.4  MG 1.9 1.9 1.9  --   --   --   --   PHOS 3.2 3.4 3.3  --   --   --   --   < > = values in this interval not displayed.  CBC  Recent Labs Lab 08/27/15 0500 08/27/15 1136 08/28/15 0610 08/29/15 0545  WBC 18.9*  --  22.8* 24.5*  HGB 7.6* 7.5* 8.1* 8.4*  HCT 23.4* 22.0* 24.7* 25.9*  PLT 625*  --  726* 843*    Coag's No results for input(s): APTT, INR in the last 168 hours.  Sepsis Markers  Recent Labs Lab 08/23/15 1005 08/24/15 0520 08/25/15 0540  LATICACIDVEN 1.2   --   --   PROCALCITON 0.17 0.21 0.23    ABG No results for input(s): PHART, PCO2ART, PO2ART in the last 168 hours.  Liver Enzymes No results for input(s): AST, ALT, ALKPHOS, BILITOT, ALBUMIN in the last 168 hours.  Cardiac Enzymes No results for input(s): TROPONINI, PROBNP in the last 168 hours.  Glucose  Recent Labs Lab 08/28/15 1647 08/28/15 1944 08/29/15 0001 08/29/15 0311 08/29/15 0742 08/29/15 1149  GLUCAP 127* 125* 141* 140* 137* 128*    Imaging Dg Chest Port 1 View  08/29/2015  CLINICAL DATA:  Patient with respiratory failure. EXAM: PORTABLE CHEST 1 VIEW COMPARISON:  Chest radiograph 08/28/2015. FINDINGS: ET tube terminates in the mid trachea. Enteric tube courses inferior to the diaphragm. Right IJ central venous catheter tip projects over the superior vena cava. Multi lead AICD device overlies the left hemi thorax, leads are stable in position. Stable cardiac and mediastinal contours. Low lung volumes. Interval increased right basilar airspace opacity. No pleural effusion or pneumothorax. IMPRESSION: Increasing right basilar opacity favored represent atelectasis. Stable support apparatus. Electronically Signed   By: Lovey Newcomer M.D.   On:  08/29/2015 07:59   Dg Abd Portable 1v  08/28/2015  CLINICAL DATA:  65 year old female status post feeding tube placement. EXAM: PORTABLE ABDOMEN - 1 VIEW COMPARISON:  08/27/2014. FINDINGS: Feeding tube is present with the tip in the antral pre-pyloric region of the stomach. Patient is intubated, with the tip of the endotracheal tube approximately 3.7 cm above the carina. Left-sided biventricular pacemaker/AICD with lead tips projecting over the expected location of the right atrium, right ventricle and overlying the lateral wall the left ventricle via the coronary sinus and coronary veins. Low lung volumes with bibasilar opacities favored to reflect subsegmental atelectasis. Visualized bowel gas pattern is nonobstructive. Orthopedic fixation  hardware in the lower thoracic and upper lumbar spine. Surgical clips in the upper abdomen. IMPRESSION: 1. Tip of feeding tube is in the antral pre-pyloric region of the stomach. Electronically Signed   By: Vinnie Langton M.D.   On: 08/28/2015 17:21   CXR images personally reviewed > agree with findings described by by radiology  STUDIES:  4/18 CT head >> 3 cm Lt frontal hematoma with SAH and SDH, 6 mm shift 4/24 CT head >> Lt MCA infarct, cytotoxic edema, 7 mm shift 4/24 Echo >> EF 30 to 35%, diffuse hypokinesis, mod MR, PAS 54 mmHg 4/25 Doppler legs b/l >> Lt soleal vein DVT  CULTURES: 4/22 Blood >> Coag neg Staph  4/22 Sputum >> Multiple organisms, none predominant 4/23 Urine >> E coli 4/23 Blood >> 4/27 C diff >> negative  ANTIBIOTICS: 4/23 Vancomycin >> 4/25 4/23 Zosyn >> 4/28  SIGNIFICANT EVENTS: 4/18 Admit, neurology/neurosurgery consulted; Lt frontotemporal craniotomy with evacuation of SDH and ICH with biopsy 4/23 Fever (101.5) >> start Abx 4/26 IVC filter placed 4/27 Extubated >> stridor >> re intubated and started solumedrol/BID protonix  LINES/TUBES: 4/18 ETT >> 4/27 4/18 Rt IJ CVL >> 4/27 ETT >>   DISCUSSION: 65 yo female former smoker with vomiting, seizure, slurred speech and altered mental status from Lt ICH.  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  Developed stridor on 4/27 post extubation.  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 Seizure Brain mass, s/p biopsy P:   Monitor mental status F/u brain bx  AEDs per neurology Hold outpt effexor  CARDIOVASCULAR A:  Hx of NICM after chemotherapy with chronic systolic CHF Hx of LBBB, HTN, HLD Lt leg DVT  >> no anticoagulation in setting of ICH/SAH/SDH >> s/p IVC filter 4/26 P:  Goal SBP < 160, prn labetalol Continue coreg, lipitor Continue lasix Hold outpt diovan, corlanor for now  RENAL A:   Hypokalemia P:   Monitor renal fx, urine outpt Replace electrolytes as needed  GASTROINTESTINAL A:   Nutrition Hx of GERD >> might be contributing to upper airway edema P:   Tube feeds while on vent Continue BID protonix  HEMATOLOGIC A:   Anemia of critical illness Leukocytosis P: F/u CBC SCDs for DVT prevention  INFECTIOUS A:   Fever with E coli UTI >> completed abx 4/28 Coag neg Staph in 1 blood culture >> likely contaminate P:   Monitor off Abx  ENDOCRINE A:   Hx of hypothyroidism, gout. P:   Continue synthroid Hold outpt allopurinol  Updated pts wife at bedside>  she states quite clearly that Roselin would not want a tracheostomy or to live in a SNF dependent on others.  Will need to further clarify goals of care moving forward. We will plan to get her extubated, but we may need palliative involved.  CC time 34 minutes.  Roselie Awkward, MD [place] [facility] Pager: [phone] Cell: [phone] After 3pm or if no response, call [phone]

## 2015-08-30 ENCOUNTER — Inpatient Hospital Stay (HOSPITAL_COMMUNITY): Payer: BC Managed Care – PPO

## 2015-08-30 LAB — GLUCOSE, CAPILLARY
GLUCOSE-CAPILLARY: 121 mg/dL — AB (ref 65–99)
GLUCOSE-CAPILLARY: 125 mg/dL — AB (ref 65–99)
GLUCOSE-CAPILLARY: 129 mg/dL — AB (ref 65–99)
Glucose-Capillary: 119 mg/dL — ABNORMAL HIGH (ref 65–99)
Glucose-Capillary: 128 mg/dL — ABNORMAL HIGH (ref 65–99)
Glucose-Capillary: 122 mg/dL — ABNORMAL HIGH (ref 65–99)
Glucose-Capillary: 136 mg/dL — ABNORMAL HIGH (ref 65–99)

## 2015-08-30 NOTE — Progress Notes (Signed)
PULMONARY / CRITICAL CARE MEDICINE   Name: Sylvia Moore MRN: [id number] DOB: 08-04-1950    ADMISSION DATE:  08/18/2015  REFERRING MD:  ER  CHIEF COMPLAINT:  Headache  SUBJECTIVE:  Cuff leak present Doing well with pressure support Not following  VITAL SIGNS: BP 151/87 mmHg  Pulse 101  Temp(Src) 98 F (36.7 C) (Axillary)  Resp 30  Ht 5\' 8"  (1.727 m)  Wt 95.2 kg (209 lb 14.1 oz)  BMI 31.92 kg/m2  SpO2 100%  VENTILATOR SETTINGS: Vent Mode:  [-] PSV;CPAP FiO2 (%):  [40 %] 40 % Set Rate:  [14 bmp] 14 bmp Vt Set:  [500 mL] 500 mL PEEP:  [5 cmH20] 5 cmH20 Pressure Support:  [5 cmH20-10 cmH20] 8 cmH20 Plateau Pressure:  [20 cmH20-25 cmH20] 20 cmH20  INTAKE / OUTPUT: I/O last 3 completed shifts: In: [id number] [I.V.:360; NG/GT:740; IV Piggyback:345] Out: 1100 [Stool:1100]  PHYSICAL EXAMINATION: General: awake, moves R arm spontaneously HENT: ETT in place PULM CTA B, vent supported breaths CV: RRR, no mgr GI: BS+, soft, nontender MSK: normal bulk and tone Neuro: Awake, doesn't follow commands for me  LABS:  BMET  Recent Labs Lab 08/27/15 0500 08/27/15 1136 08/28/15 0610 08/29/15 0545  NA 140 140 142 146*  K 3.2* 3.8 3.7 4.0  CL 106  --  109 111  CO2 25  --  25 26  BUN 25*  --  27* 33*  CREATININE 0.70  --  0.67 0.65  GLUCOSE 107*  --  144* 144*    Electrolytes  Recent Labs Lab 08/24/15 0520 08/25/15 0540  08/27/15 0500 08/28/15 0610 08/29/15 0545  CALCIUM 8.8* 8.7*  < > 8.8* 9.2 9.4  MG 1.9 1.9  --   --   --   --   PHOS 3.4 3.3  --   --   --   --   < > = values in this interval not displayed.  CBC  Recent Labs Lab 08/27/15 0500 08/27/15 1136 08/28/15 0610 08/29/15 0545  WBC 18.9*  --  22.8* 24.5*  HGB 7.6* 7.5* 8.1* 8.4*  HCT 23.4* 22.0* 24.7* 25.9*  PLT 625*  --  726* 843*    Coag's No results for input(s): APTT, INR in the last 168 hours.  Sepsis Markers  Recent Labs Lab 08/24/15 0520 08/25/15 0540  PROCALCITON 0.21 0.23     ABG No results for input(s): PHART, PCO2ART, PO2ART in the last 168 hours.  Liver Enzymes No results for input(s): AST, ALT, ALKPHOS, BILITOT, ALBUMIN in the last 168 hours.  Cardiac Enzymes No results for input(s): TROPONINI, PROBNP in the last 168 hours.  Glucose  Recent Labs Lab 08/29/15 1149 08/29/15 1615 08/29/15 1927 08/30/15 0011 08/30/15 0311 08/30/15 0757  GLUCAP 128* 124* 120* 129* 121* 119*    Imaging No results found. CXR images personally reviewed > agree with findings described by by radiology  STUDIES:  4/18 CT head >> 3 cm Lt frontal hematoma with SAH and SDH, 6 mm shift 4/24 CT head >> Lt MCA infarct, cytotoxic edema, 7 mm shift 4/24 Echo >> EF 30 to 35%, diffuse hypokinesis, mod MR, PAS 54 mmHg 4/25 Doppler legs b/l >> Lt soleal vein DVT  CULTURES: 4/22 Blood >> Coag neg Staph  4/22 Sputum >> Multiple organisms, none predominant 4/23 Urine >> E coli 4/23 Blood >> 4/27 C diff >> negative  ANTIBIOTICS: 4/23 Vancomycin >> 4/25 4/23 Zosyn >> 4/28  SIGNIFICANT EVENTS: 4/18 Admit, neurology/neurosurgery consulted; Lt frontotemporal craniotomy  with evacuation of SDH and ICH with biopsy 4/23 Fever (101.5) >> start Abx 4/26 IVC filter placed 4/27 Extubated >> stridor >> re intubated and started solumedrol/BID protonix 4/29 cuff leak present  LINES/TUBES: 4/18 ETT >> 4/27 4/18 Rt IJ CVL >> 4/27 ETT >>   DISCUSSION: 65 yo female former smoker with vomiting, seizure, slurred speech and altered mental status from Lt ICH.  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  Developed stridor on 4/27 post extubation. Cuff lead present on 4/29, family requests extubation on 5/1.   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 Seizure Brain mass, s/p biopsy P:   Monitor mental status F/u brain bx  AEDs per neurology Hold outpt effexor  CARDIOVASCULAR A:  Hx of NICM after chemotherapy with chronic systolic CHF Hx of LBBB, HTN, HLD Lt leg DVT >> no anticoagulation in setting of ICH/SAH/SDH >> s/p IVC filter 4/26 P:  Goal SBP < 160, prn labetalol Continue coreg, lipitor Continue lasix Hold outpt diovan, corlanor for now  RENAL A:   Hypokalemia P:   Monitor renal fx, urine outpt Replace electrolytes as needed  GASTROINTESTINAL A:   Nutrition Hx of GERD >> might be contributing to upper airway edema P:   Tube feeds while on vent Continue BID protonix  HEMATOLOGIC A:   Anemia of critical illness Leukocytosis P: F/u CBC SCDs for DVT prevention  INFECTIOUS A:   Fever with E coli UTI >> completed abx 4/28 Coag neg Staph in 1 blood culture >> likely contaminate P:   Monitor off Abx  ENDOCRINE A:   Hx of hypothyroidism, gout P:   Continue synthroid Hold outpt allopurinol  Updated pts wife at bedside on 4/29 and 4/30> she states quite clearly that Sylvia Moore would not want a tracheostomy or to live in a SNF dependent on others.  She tells me today that she does not want her to receive CPR and is interested in going home on hospice.  At this point I would plan on a one way extubation without full comfort measures, but if she develops respiratory failure she may need in hospital full comfort measures.  CC time 34 minutes.  Roselie Awkward, MD [place] [facility] Pager: [phone] Cell: [phone] After 3pm or if no response, call [phone]

## 2015-08-30 NOTE — Progress Notes (Signed)
Patient ID: Sylvia Moore, female   DOB: 05-23-1950, 65 y.o.   MRN: [id number] BP 130/75 mmHg  Pulse 89  Temp(Src) 98.7 F (37.1 C) (Axillary)  Resp 21  Ht 5\' 8"  (1.727 m)  Wt 95.2 kg (209 lb 14.1 oz)  BMI 31.92 kg/m2  SpO2 100% Alert, tracking consistently Did follow commands today Certainly responds to voice Right facial droop Right side plegia Wounds are clean, dry, no signs of infection.

## 2015-08-30 NOTE — Progress Notes (Signed)
STROKE TEAM PROGRESS NOTE   SUBJECTIVE (INTERVAL HISTORY) Her sister is at the bedside.  Patient still intubated but eyes open and interactive. No significant changes  OBJECTIVE Temp:  [98 F (36.7 C)-98.7 F (37.1 C)] 98.7 F (37.1 C) (04/30 1200) Pulse Rate:  [60-101] 101 (04/30 1400) Cardiac Rhythm:  [-] Normal sinus rhythm (04/30 0800) Resp:  [14-30] 27 (04/30 1400) BP: (123-151)/(67-96) 149/90 mmHg (04/30 1400) SpO2:  [81 %-100 %] 100 % (04/30 1400) FiO2 (%):  [40 %] 40 % (04/30 1128) Weight:  [209 lb 14.1 oz (95.2 kg)] 209 lb 14.1 oz (95.2 kg) (04/30 0500)  CBC:   Recent Labs Lab 08/24/15 0520  08/28/15 0610 08/29/15 0545  WBC 18.9*  < > 22.8* 24.5*  NEUTROABS 14.5*  --   --   --   HGB 9.0*  < > 8.1* 8.4*  HCT 27.5*  < > 24.7* 25.9*  MCV 100.4*  < > 102.1* 104.4*  PLT 393  < > 726* 843*  < > = values in this interval not displayed.  Basic Metabolic Panel:   Recent Labs Lab 08/24/15 0520 08/25/15 0540  08/28/15 0610 08/29/15 0545  NA 137 135  < > 142 146*  K 4.0 3.5  < > 3.7 4.0  CL 104 101  < > 109 111  CO2 24 25  < > 25 26  GLUCOSE 126* 103*  < > 144* 144*  BUN 21* 24*  < > 27* 33*  CREATININE 0.57 0.62  < > 0.67 0.65  CALCIUM 8.8* 8.7*  < > 9.2 9.4  MG 1.9 1.9  --   --   --   PHOS 3.4 3.3  --   --   --   < > = values in this interval not displayed.  Lipid Panel:     Component Value Date/Time   CHOL 123 08/25/2015 0540   TRIG 103 08/25/2015 0540   HDL 21* 08/25/2015 0540   CHOLHDL 5.9 08/25/2015 0540   VLDL 21 08/25/2015 0540   LDLCALC 81 08/25/2015 0540   HgbA1c:  Lab Results  Component Value Date   HGBA1C 5.7* 08/25/2015   Urine Drug Screen:     Component Value Date/Time   LABOPIA NONE DETECTED 08/18/2015 1044   COCAINSCRNUR NONE DETECTED 08/18/2015 1044   LABBENZ NONE DETECTED 08/18/2015 1044   AMPHETMU NONE DETECTED 08/18/2015 1044   THCU NONE DETECTED 08/18/2015 1044   LABBARB NONE DETECTED 08/18/2015 1044      IMAGING I  have personally reviewed the radiological images below and agree with the radiology interpretations.  Ct Head Wo Contrast 08/24/2015 IMPRESSION: 1. Stable moderate subarachnoid hemorrhage on the left. 2. Left MCA territory infarct. Cytotoxic edema results in increased midline shift, now 7 mm. 3. EVD with no ventriculomegaly.  08/19/2015  Interval LEFT frontotemporal craniotomy for evacuation of temporal lobe hematoma and subdural hematoma. Moderate residual LEFT subarachnoid hemorrhage. Trace LEFT tentorial subdural hematoma. Improved, residual 3 mm LEFT to RIGHT midline shift.  08/18/2015   ~ 3cm left frontal parenchymal hematoma with subarachnoid and subdural extension. There may be an underlying mass and CT with and without contrast is recommended. 6 mm midline shift.  08/18/2015  Large area of hemorrhage in the left hemisphere has progressed since 1 hour ago. There is parenchymal hemorrhage with active extravasation in the left frontal operculum. There is also increase in left hemispheric subdural hemorrhage. There is a small amount of subarachnoid hemorrhage in the left sylvian fissure. Increase in  low-density edema on the left frontal lobe. Increase in midline shift which is now 11 mm to the right   Ct Angio Head & Neck W/cm &/or Wo/cm 08/18/2015   Negative for vascular malformation. No aneurysm or AVM is seen to account for the large left frontal temporal hematoma. Large hematoma left frontal temporal lobe is stable since the recent CT scan. Extravasated contrast shows improvement since the prior study. Mild amount of subarachnoid hemorrhage. 6 mm left subdural hematoma is stable. There is increased edema in the left hemisphere with increased mass-effect on the lateral ventricle. 11 mm midline shift is unchanged.   Ct Chest Abdomen Pelvis W Contrast 08/18/2015   1. No evidence of primary malignancy or metastatic disease in the chest or abdomen. 2. 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 3. Low volume chest with bilateral atelectasis. 4. Chronic findings are stable   Dg Chest [facility] 08/19/2015   Increased left basilar subsegmental atelectasis. Stable support apparatus.  08/18/2015  1. New central line in unremarkable position.  No pneumothorax. 2. Low volume chest with basilar atelectasis.  08/18/2015   Bibasilar atelectasis. Tube positions as above.   2D echo - - Left ventricle: The cavity size was mildly dilated. Wall  thickness was normal. Systolic function was moderately to  severely reduced. The estimated ejection fraction was in the  range of 30% to 35%. Diffuse hypokinesis. Doppler parameters are  consistent with restrictive physiology, indicative of decreased  left ventricular diastolic compliance and/or increased left  atrial pressure. - Aortic valve: There was trivial regurgitation. - Mitral valve: There was moderate regurgitation. - Pulmonary arteries: Systolic pressure was moderately increased.  PA peak pressure: 54 mm Hg (S). Impressions: - Moderate to severe global reduction in LV function; restrictive  LV filling; sclerotic aortic valve with trace AI; moderate MR;  mild TR with moderately elevated pulmonary pressure; possible  patent foramen ovale noted on subcostal images.  LE venous doppler - DVT noted in the left soleal vein. No DVT RLE.   PHYSICAL EXAM Middle-aged Caucasian lady again intubated on sedation. Head is nontraumatic. Neck is supple without bruit. Cardiac exam no murmur or gallop. Lungs are clear to auscultation. Distal pulses are well felt. Head dressing. Left eye periorbitall swelling post craniotomy.  Neurological Exam  Patient still intubated but eyes open and tracking but does not follow any commands.aphsic globally Pupils are both 2 mm reactive. Corneal reflexes are present. Fundi cannot be visualized. Facial difficult to assess due to ET tube. Spontaneous movements on left upper and lower  extremity. Does not move the right side to painful stimuli except slight movement in the right leg. Purposeful withdrawal on the left leg and arm pain. Right plantar upgoing left downgoing.  ASSESSMENT/PLAN Ms. DRAVEN MERANTE is a 65 y.o. female with history of CHF, schwannoma of spinal cord, orthostatic HTN, GERD, L BBB, Hepatitis and Hodgkin's lymphoma presenting with altered mental status and dysarthria.   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 Suspect hemorrhage secondary to right MCA infarct vs. underlying tumor. Pathology pending.   Resultant Right hemiplegia and likely aphasia   Neurosurgery consulted Joya Salm) 4/19 crani with IVC placed, ? Tumor. Path pending   CT abdomen/chest/pelvis no evidence of metastatic disease. 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 L frontal lobe ICH w/ SAH, L SDH. Mass effect with 41mm midline shift  Repeat CT head - decreased midline shift now 23mm, but pattern more consistent with left MCA infarct with hemorrhagic transformation  TTE 30-35% down from EF 35-40% in 01/2015  Pacemaker interrogation showed no afib  LDL 81  A1C 5.7  subq heparin for VTE prophylaxis Diet NPO time specified  No antithrombotic prior to admission, no antithrombotic now due to [place]  Ongoing aggressive stroke risk factor management  Therapy recommendations:  pending   Disposition:  pending (has a 30 year-old daughter with mental retardation, unable to make decisions)  If source of hemorrhage is tumor, type of tumor will dictate prognosis.  Patient's neurologic condition post extubation will also help guide plans  Chronic CHF  Has been following with Dr. Rayann Heman in clinic  Pacemaker in situ  EF 35-40% in 01/2015  Now EF down to 30-35%  CXR no pulmonary edema  Left LE  DVT  Found on LE venous doppler  High risk for anticoagulation due to [place]  IVC filter placed  Respiratory failure  Intubated in the ED following seizure from [place]  Not tolerating extubate due to stridor  Re-intubated   CCM on board  Wean off as able  May consider trach if not able to wean off vent early next week  Seizure  Left greater than right body involvement  On Keppra 1.5g q 12h  If EEG neg for seizure, will decrease dose  EEG on hold now  Fever, resolved  WBC 20.8->17.8->19.1->18.9->13.2  Blood culture NGTD   UA TNTC   Urine Cx E.Coli  Sputum culture negative  Pt is on zosyn  Hypertension  Stable now  Hyperlipidemia  No statin at home  LDL 81, goal < 70  Add lipitor 10mg    Other Stroke Risk Factors  Former Cigarette smoker, quit smoking 47 years ago   ETOH use  Marijuana use  Obesity, Body mass index is 31.92 kg/(m^2).   Family hx stroke (father)  Other Active Problems  History of Hodgkin's lymphoma  History of hepatitis, type unknown  History of spinal cord schwannoma  Hypothyroidism  Hospital day # 12 I had a long discussion with the bedside with the patient's sister regarding her prognosis which appears guarded given significant aphasia and dense right hemiplegia. The pathology of brain lesion is not yet back from [facility] and is unclear as to when the report will be back. The patient's wife had informed me that she is clear that patient would not want tracheostomy, prolonged ventilatory support and life in a nursing home.She needs to make a definite decision about comfort care versus tracheostomy before the patient is given another trial of extubation. This patient is critically ill due to left frontal ICH with mass effect s/p evacuation and EVD, suspicious for brain mass, left DVT, fever and at significant risk of neurological worsening, death form CNS infection, recurrent bleeding, cerebral edema and brain herniation  as well as PE. This patient's care requires constant monitoring of vital signs, hemodynamics, respiratory and cardiac monitoring, review of multiple databases, neurological assessment, discussion with family, other specialists and medical decision making of high complexity. I spent 35 minutes of neurocritical care time in the care of this patient.   Antony Contras, MD Stroke Neurology 08/30/2015 2:36 PM     To contact Stroke Continuity provider, please refer to [email or web address] After hours, contact General Neurology

## 2015-08-31 DIAGNOSIS — Z66 Do not resuscitate: Secondary | ICD-10-CM

## 2015-08-31 DIAGNOSIS — I429 Cardiomyopathy, unspecified: Secondary | ICD-10-CM

## 2015-08-31 DIAGNOSIS — K117 Disturbances of salivary secretion: Secondary | ICD-10-CM

## 2015-08-31 DIAGNOSIS — R1310 Dysphagia, unspecified: Secondary | ICD-10-CM

## 2015-08-31 DIAGNOSIS — Z515 Encounter for palliative care: Secondary | ICD-10-CM

## 2015-08-31 DIAGNOSIS — I619 Nontraumatic intracerebral hemorrhage, unspecified: Secondary | ICD-10-CM | POA: Insufficient documentation

## 2015-08-31 LAB — CBC WITH DIFFERENTIAL/PLATELET
Basophils Absolute: 0 10*3/uL (ref 0.0–0.1)
Basophils Relative: 0 %
EOS ABS: 0 10*3/uL (ref 0.0–0.7)
Eosinophils Relative: 0 %
HEMATOCRIT: 26.5 % — AB (ref 36.0–46.0)
HEMOGLOBIN: 8.5 g/dL — AB (ref 12.0–15.0)
LYMPHS ABS: 1.1 10*3/uL (ref 0.7–4.0)
Lymphocytes Relative: 5 %
MCH: 33.9 pg (ref 26.0–34.0)
MCHC: 32.1 g/dL (ref 30.0–36.0)
MCV: 105.6 fL — ABNORMAL HIGH (ref 78.0–100.0)
MONO ABS: 0.7 10*3/uL (ref 0.1–1.0)
MONOS PCT: 3 %
NEUTROS ABS: 18.4 10*3/uL — AB (ref 1.7–7.7)
NEUTROS PCT: 91 %
Platelets: 921 10*3/uL (ref 150–400)
RBC: 2.51 MIL/uL — ABNORMAL LOW (ref 3.87–5.11)
RDW: 13.5 % (ref 11.5–15.5)
WBC: 20.2 10*3/uL — ABNORMAL HIGH (ref 4.0–10.5)

## 2015-08-31 LAB — BASIC METABOLIC PANEL
Anion gap: 9 (ref 5–15)
BUN: 43 mg/dL — ABNORMAL HIGH (ref 6–20)
CHLORIDE: 110 mmol/L (ref 101–111)
CO2: 29 mmol/L (ref 22–32)
CREATININE: 0.62 mg/dL (ref 0.44–1.00)
Calcium: 9.4 mg/dL (ref 8.9–10.3)
GFR calc non Af Amer: >60 mL/min (ref >60)
GLUCOSE: 127 mg/dL — AB (ref 65–99)
Potassium: 3.9 mmol/L (ref 3.5–5.1)
Sodium: 148 mmol/L — ABNORMAL HIGH (ref 135–145)

## 2015-08-31 LAB — GLUCOSE, CAPILLARY
GLUCOSE-CAPILLARY: 131 mg/dL — AB (ref 65–99)
GLUCOSE-CAPILLARY: 133 mg/dL — AB (ref 65–99)
Glucose-Capillary: 121 mg/dL — ABNORMAL HIGH (ref 65–99)

## 2015-08-31 LAB — PATHOLOGIST SMEAR REVIEW

## 2015-08-31 MED ORDER — LORAZEPAM 2 MG/ML IJ SOLN
2.0000 mg | INTRAMUSCULAR | Status: DC | PRN
  Administered 2015-08-31 – 2015-09-01 (×4): 2 mg via INTRAVENOUS
  Filled 2015-08-31 (×4): qty 1

## 2015-08-31 MED ORDER — SODIUM CHLORIDE 0.9 % IV SOLN
1.0000 mg/h | INTRAVENOUS | Status: DC
  Administered 2015-09-01: 1 mg/h via INTRAVENOUS
  Filled 2015-08-31: qty 10

## 2015-08-31 MED ORDER — GLYCOPYRROLATE 0.2 MG/ML IJ SOLN
0.4000 mg | Freq: Four times a day (QID) | INTRAMUSCULAR | Status: DC
  Administered 2015-08-31 – 2015-09-01 (×3): 0.4 mg via INTRAVENOUS
  Filled 2015-08-31 (×4): qty 2

## 2015-08-31 MED ORDER — ONDANSETRON HCL 4 MG/2ML IJ SOLN: 4.0000 mg | Freq: Four times a day (QID) | INTRAMUSCULAR | Status: DC | PRN

## 2015-08-31 MED ORDER — MORPHINE SULFATE (PF) 2 MG/ML IV SOLN
2.0000 mg | INTRAVENOUS | Status: DC | PRN
  Administered 2015-08-31 (×3): 2 mg via INTRAVENOUS
  Filled 2015-08-31 (×3): qty 1

## 2015-08-31 MED ORDER — MORPHINE BOLUS VIA INFUSION
2.0000 mg | INTRAVENOUS | Status: DC | PRN
  Filled 2015-08-31: qty 2

## 2015-08-31 NOTE — Progress Notes (Signed)
Pacer rep came into room and deactivated pacer

## 2015-08-31 NOTE — Progress Notes (Signed)
Received call from RN at 22:30 re: family request for rapid heart rate to be treated. Unclear motivations for this request. RN has administered maximal doses of palliative PRNs and expresses concern that secretions have been difficult to handle. I reviewed PRN dosing of comfort meds and recommended a continuous infusion of morphine based on goals outlined in consultation note from today. Family need support and education on EOL process. I further recommended that we treat the HR as a possible sign of uncontrolled symptoms in a non-verbal recently extubated patient with no prior known history of tachyarrhythmias- they also need education that this is part of the normal dying process in some cases reflecting bodies physiologic response to dehydration and hypotension.  Will have palliative provider follow-up in AM.   Orders placed for morphine infusion with bolus dosing and instructions given for gentle deep laryngeal suction.  Lane Hacker, DO Palliative Medicine [phone]

## 2015-08-31 NOTE — Consult Note (Signed)
[employer]  Received request from [place] for family interest in [facility]. Appreciate report from Palliative Medicine NP, Wadie Lessen. Chart reviewed and met with spouse, sister, daughter and friend at bedside to confirm interest and answer their questions. [employer] available tomorrow. Spouse relieved and agreeable to transfer. Plan is to complete paperwork Tuesday morning prior to transfer. Will update CSW in am.   Please fax discharge summary to [phone].  RN please call report to [phone],  Thank you.  Erling Conte, [place]

## 2015-08-31 NOTE — Progress Notes (Signed)
CRITICAL VALUE ALERT  Critical value received:  Plt 921,000  Date of notification:  08/31/2015  Time of notification:  0523  Critical value read back:Yes.    Nurse who received alert:  Velvet Bathe  MD notified (1st page): Dr. Ashok Cordia  Time of first page:  0525  MD notified (2nd page):  Time of second page:  Responding MD:  Dr. Ashok Cordia  Time MD responded:  [phone]

## 2015-08-31 NOTE — Procedures (Signed)
Extubation Procedure Note  Patient Details:   Name: Sylvia Moore DOB: [birth year] MRN: [id number]   Airway Documentation:     Evaluation  O2 sats: stable throughout Complications: No apparent complications Patient did tolerate procedure well. Bilateral Breath Sounds: Clear   Yes   Pt. Was extubated to room air with no dyspnea, stridor or distress noted. Family & RN were present at the bedside during extubation.   Claretta Fraise 08/31/2015, 12:33 PM

## 2015-08-31 NOTE — Consult Note (Signed)
Consultation Note Date: 08/31/2015   Patient Name: Sylvia Moore  DOB: 28-Apr-1951  MRN: [id number]  Age / Sex: 65 y.o., female  PCP: Seward Carol, MD Referring Physician: [name], MD  Reason for Consultation: Establishing goals of care, Psychosocial/spiritual support and Withdrawal of life-sustaining treatment     HPI/Patient Profile:  34 yof c/o headache the am of 4/18--followed by aphasia and then somnolence. Had witnessed seizure activity by staff while in CT scan. CT showed large left ICH w/ midline shift. Admitted to PCCM service, w/ plan for probable left Crani pending results of CT angio  PAST MEDICAL HISTORY :  She has a past medical history of Chronic systolic heart failure ([place]); Depression; Gout; HOH (hard of hearing); Schwannoma of spinal cord ([place]); Orthostatic hypotension; NICM (nonischemic cardiomyopathy) (HCC); GERD (gastroesophageal reflux disease); LBBB (left bundle branch block); Pneumonia (1960's X 1; 2014); Hypothyroidism; History of blood transfusion (~ 1976); Hepatitis (~ 1978); and Hodgkin's lymphoma ([place]).  PAST SURGICAL HISTORY: She  has past surgical history that includes Hysteroscopy w/D&C; Colonoscopy w/ polypectomy; Lumbar fusion (07/2011); Dilation and curettage of uterus; Abdominal exploration surgery (1975); Knee arthroscopy (Left); Colposcopy (N/A, 08/14/2012); Lesion removal (N/A, 08/14/2012); Rectal biopsy (N/A, 08/14/2012); Esophagogastroduodenoscopy (egd) with propofol (N/A, 01/20/2014); Colonoscopy with propofol (N/A, 01/20/2014); Knee arthroscopy (Right, 2015); left heart catheterization with coronary angiogram (N/A, 07/01/2014); Back surgery; Laparoscopic cholecystectomy (~ 2005); Neck lesion biopsy (Right, 1975); Splenectomy (1970's); and bi-ventricular implantable cardioverter defibrillator (N/A, 07/08/2014).   Clinical Assessment and Goals of Care:  Consult is for review  of medical treatment options, clarification of goals of care and end of life issues, disposition and options, and symptom recommendation as indicated  This NP Wadie Lessen reviewed medical records, received report from team, assessed the patient and then meet at the patient's bedside along with her wife, siblings, daughter and religious support  to discuss diagnosis prognosis, GOC, EOL wishes disposition and options.   A detailed discussion was had today regarding advanced directives.  Concepts specific to code status, artifical feeding and hydration, continued IV antibiotics and rehospitalization was had.  The difference between a aggressive medical intervention path  and a palliative comfort care path for this patient at this time was had.  Values and goals of care important to patient and family were attempted to be elicited.  Concept of Hospice and Palliative Care were discussed  Natural trajectory and expectations at EOL were discussed.  Questions and concerns addressed.   Family encouraged to call with questions or concerns.  PMT will continue to support holistically.  Family has made clear decision to shift to a full comfort path. Extubate today, no further reintubation.  Comfort feeds as tolerated, no artificial feeding or hydration now or in the future.  Minimize medication, no IV antibiotic use  Hopeful for [facility].  Prognosis is less than two week, patient is not taking in POs, unable to handle secretion, full comfort is focus of care    HCPOA/ wife- [place]  Code Status/Advance Care Planning:  DNR    Symptom Management:   Dyspnea:  Morphine 2 mg IV every one hour prn  Agitation: Ativan 2 mg IV every 4 hrs prn  Terminal secretions: Rubinol 0.4 mg  tid   Palliative Prophylaxis:   Aspiration, Bowel Regimen, Frequent Pain Assessment, Oral Care and Turn Reposition  Additional Recommendations (Limitations, Scope,  Preferences):  Full Comfort Care  Psycho-social/Spiritual:   Desire for further Chaplaincy support:no- strong community church support  Additional Recommendations: Compassionate Geneticist, molecular, Education on Hospice and Grief/Bereavement Support  Prognosis  < 2 weeks  Discharge Planning: Hospice facility      Primary Diagnoses: Present on Admission:  . ICH (intracerebral hemorrhage) ([place]) . Acute respiratory failure with hypoxia ([place])  I have reviewed the medical record, interviewed the patient and family, and examined the patient. The following aspects are pertinent.  Past Medical History  Diagnosis Date  . Chronic systolic heart failure ([place])   . Depression   . Gout        . HOH (hard of hearing)     has hearing aids  . 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 Orthostatic hypotension        . NICM (nonischemic cardiomyopathy) ([place])     a. 2/2 radiation and chemotherapy b. echo 2/16  EF 20-25% c. non ischemic myoview 5/14 d. s/p STJ CRTD  . GERD (gastroesophageal reflux disease)   . LBBB (left bundle branch block)   . Pneumonia 1960's X 1; 2014  . Hypothyroidism     a. s/p thyroid radiation  . History of blood transfusion ~ 1976    "related to cyst on my lung"  . Hepatitis ~ 1978    "got hepatitis w/mono; don't know which kind"  . Hodgkin's lymphoma ([place])     a. s/p rad' and chemo  in 1975-1977 b. s/p splenectomy 1970's   Social History   Social History  . Marital Status: Married    Spouse Name: N/A  . Number of Children: N/A  . Years of Education: N/A   Social History Main Topics  . Smoking status: Former Smoker -- 0.25 packs/day for 1 years    Types: Cigarettes    Quit date: 05/02/1968  . Smokeless tobacco: Never Used  . Alcohol Use: 1.8 oz/week    3 Shots of liquor per week     Comment: weekends; if that  . Drug Use: Yes    Special: Marijuana     Comment: for 8 yrs - quit in 1987  . Sexual Activity: Yes    Birth Control/ Protection:  Post-menopausal   Other Topics Concern  . None   Social History Narrative   Family History  Problem Relation Age of Onset  . Hypertension Father   . Stroke Father   . Heart failure Father   . Pneumonia Mother   . Healthy Sister   . Healthy Brother    Scheduled Meds: .  stroke: mapping our early stages of recovery book   Does not apply Once  . antiseptic oral rinse  7 mL Mouth Rinse 10 times per day  . chlorhexidine gluconate (SAGE KIT)  15 mL Mouth Rinse BID  . glycopyrrolate  0.4 mg Intravenous QID  . levETIRAcetam  1,500 mg Intravenous Q12H   Continuous Infusions: . sodium chloride 10 mL/hr (08/28/15 0942)   PRN Meds:.fentaNYL (SUBLIMAZE) injection, LORazepam, morphine injection, ondansetron (ZOFRAN) IV Medications Prior to Admission:  Prior to Admission medications  Medication Sig Start Date End Date Taking? Authorizing Provider  allopurinol (ZYLOPRIM) 100 MG tablet Take 100 mg by mouth 2 (two) times daily.   Yes Historical Provider, MD  carvedilol (COREG) 12.5 MG tablet Take 0.5 tablets (6.25 mg total) by mouth 2 (two) times daily with a meal. 06/21/14  Yes Eileen Stanford, PA-C  CORLANOR 5 MG TABS tablet TAKE 1 & 1/2 TABLETS TWICE DAILY WITH MEALS. 03/23/15  Yes Thompson Grayer, MD  furosemide (LASIX) 20 MG tablet TAKE 1 TABLET ONCE DAILY. 07/15/15  Yes [name], MD  levothyroxine (SYNTHROID, LEVOTHROID) 125 MCG tablet Take 125 mcg by mouth daily. Reported on 05/22/2015   Yes Historical Provider, MD  Multiple Vitamins-Minerals (MULTIVITAMIN PO) Take 1 tablet by mouth daily.   Yes Historical Provider, MD  omeprazole (PRILOSEC OTC) 20 MG tablet Take 20 mg by mouth at bedtime.    Yes Historical Provider, MD  valsartan (DIOVAN) 40 MG tablet TAKE 1 TABLET ONCE DAILY. 07/17/15  Yes [name], MD  venlafaxine XR (EFFEXOR-XR) 37.5 MG 24 hr capsule Take 37.5 mg by mouth daily with breakfast.    Yes Historical Provider, MD  nitroGLYCERIN (NITROSTAT) 0.4 MG SL  tablet Place 1 tablet (0.4 mg total) under the tongue every 5 (five) minutes as needed for chest pain. 06/28/14   Evelene Croon Barrett, PA-C   Allergies  Allergen Reactions  . Codeine Nausea And Vomiting   Review of Systems  Unable to perform ROS   Physical Exam  Vital Signs: BP 134/75 mmHg  Pulse 78  Temp(Src) 98.5 F (36.9 C) (Axillary)  Resp 24  Ht 5' 8"  (1.727 m)  Wt 92.8 kg (204 lb 9.4 oz)  BMI 31.11 kg/m2  SpO2 100% Pain Assessment: CPOT   Pain Score: Asleep   SpO2: SpO2: 100 % O2 Device:SpO2: 100 % O2 Flow Rate: .O2 Flow Rate (L/min): 40 L/min  IO: Intake/output summary:  Intake/Output Summary (Last 24 hours) at 08/31/15 1226 Last data filed at 08/31/15 0800  Gross per 24 hour  Intake    830 ml  Output   2390 ml  Net  -1560 ml    LBM: Last BM Date: 08/31/15 Baseline Weight: Weight: 91.7 kg (202 lb 2.6 oz) Most recent weight: Weight: 92.8 kg (204 lb 9.4 oz)     Palliative Assessment/Data:   Discussed with Dr Leonie Man  Time In: 1200 Time Out: 1315 Time Total: 75 min Greater than 50%  of this time was spent counseling and coordinating care related to the above assessment and plan.  Signed by: Wadie Lessen, NP   Please contact Palliative Medicine Team phone at [phone] for questions and concerns.  For individual provider: See Shea Evans

## 2015-08-31 NOTE — Progress Notes (Signed)
PULMONARY / CRITICAL CARE MEDICINE   Name: Sylvia Moore MRN: [id number] DOB: 29-Apr-1951    ADMISSION DATE:  08/18/2015  REFERRING MD:  ER  CHIEF COMPLAINT:  Headache  SUBJECTIVE:  Cuff leak present Purposeful movements but not following commands Awake, alert  VITAL SIGNS: BP 137/82 mmHg  Pulse 80  Temp(Src) 98 F (36.7 C) (Axillary)  Resp 16  Ht 5\' 8"  (1.727 m)  Wt 92.8 kg (204 lb 9.4 oz)  BMI 31.11 kg/m2  SpO2 100%  VENTILATOR SETTINGS: Vent Mode:  [-] PRVC FiO2 (%):  [40 %] 40 % Set Rate:  [14 bmp] 14 bmp Vt Set:  [500 mL] 500 mL PEEP:  [5 cmH20] 5 cmH20 Pressure Support:  [8 cmH20] 8 cmH20 Plateau Pressure:  [19 cmH20-20 cmH20] 20 cmH20  INTAKE / OUTPUT: I/O last 3 completed shifts: In: 1540 [I.V.:360; NG/GT:720; IV Piggyback:460] Out: 2715 [Urine:2090; Stool:625]  PHYSICAL EXAMINATION: General: awake, moves L arm spontaneously HENT: ETT in place PULM CTA B, vent supported breaths CV: RRR, no mgr GI: BS+, soft, nontender MSK: normal bulk and tone Neuro: Awake, doesn't follow commands for me, but moving around purposefully  LABS:  BMET  Recent Labs Lab 08/28/15 0610 08/29/15 0545 08/31/15 0410  NA 142 146* 148*  K 3.7 4.0 3.9  CL 109 111 110  CO2 25 26 29   BUN 27* 33* 43*  CREATININE 0.67 0.65 0.62  GLUCOSE 144* 144* 127*    Electrolytes  Recent Labs Lab 08/25/15 0540  08/28/15 0610 08/29/15 0545 08/31/15 0410  CALCIUM 8.7*  < > 9.2 9.4 9.4  MG 1.9  --   --   --   --   PHOS 3.3  --   --   --   --   < > = values in this interval not displayed.  CBC  Recent Labs Lab 08/28/15 0610 08/29/15 0545 08/31/15 0410  WBC 22.8* 24.5* 20.2*  HGB 8.1* 8.4* 8.5*  HCT 24.7* 25.9* 26.5*  PLT 726* 843* 921*    Coag's No results for input(s): APTT, INR in the last 168 hours.  Sepsis Markers  Recent Labs Lab 08/25/15 0540  PROCALCITON 0.23    ABG No results for input(s): PHART, PCO2ART, PO2ART in the last 168 hours.  Liver  Enzymes No results for input(s): AST, ALT, ALKPHOS, BILITOT, ALBUMIN in the last 168 hours.  Cardiac Enzymes No results for input(s): TROPONINI, PROBNP in the last 168 hours.  Glucose  Recent Labs Lab 08/30/15 1235 08/30/15 1532 08/30/15 2056 08/30/15 2311 08/31/15 0336 08/31/15 0741  GLUCAP 128* 125* 122* 136* 121* 131*    Imaging Dg Chest Port 1 View  08/30/2015  CLINICAL DATA:  Acute respiratory failure with hypoxia. History of pneumonia and Hodgkin's lymphoma. EXAM: PORTABLE CHEST 1 VIEW COMPARISON:  Chest x-rays dated 08/29/2015 and 08/28/2015. FINDINGS: Heart size is upper normal, stable. Overall cardiomediastinal silhouette is stable in size and configuration. Endotracheal tube remains well positioned with tip approximately 2 cm above the carina. Enteric tube passes below the diaphragm. Right IJ central line appears well positioned with tip at the level of the lower SVC. Study is hypoinspiratory with crowding of the perihilar and bibasilar bronchovascular markings. Dense opacity at the left lung base is likely a combination of atelectasis and small pleural effusion. IMPRESSION: 1. Dense opacity at the left lung base, slightly increased compared to recent exams, most likely a combination of atelectasis and small pleural effusion. Left lower lobe pneumonia cannot be excluded. Suspect additional mild  atelectasis at the right lung base. 2. Otherwise stable chest x-ray. Support apparatus remains appropriately positioned. Electronically Signed   By: Franki Cabot M.D.   On: 08/30/2015 15:13   CXR images personally reviewed > agree with findings described by by radiology  STUDIES:  4/18 CT head >> 3 cm Lt frontal hematoma with SAH and SDH, 6 mm shift 4/24 CT head >> Lt MCA infarct, cytotoxic edema, 7 mm shift 4/24 Echo >> EF 30 to 35%, diffuse hypokinesis, mod MR, PAS 54 mmHg 4/25 Doppler legs b/l >> Lt soleal vein DVT  CULTURES: 4/22 Blood >> Coag neg Staph  4/22 Sputum >> Multiple  organisms, none predominant 4/23 Urine >> E coli 4/23 Blood >> 4/27 C diff >> negative  ANTIBIOTICS: 4/23 Vancomycin >> 4/25 4/23 Zosyn >> 4/28  SIGNIFICANT EVENTS: 4/18 Admit, neurology/neurosurgery consulted; Lt frontotemporal craniotomy with evacuation of SDH and ICH with biopsy 4/23 Fever (101.5) >> start Abx 4/26 IVC filter placed 4/27 Extubated >> stridor >> re intubated and started solumedrol/BID protonix 4/29 cuff leak present 4/30  5/1 cuff leak present  LINES/TUBES: 4/18 ETT >> 4/27 4/18 Rt IJ CVL >> 4/27 ETT >>   DISCUSSION: 65 yo female former smoker with vomiting, seizure, slurred speech and altered mental status from Lt ICH.  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  Developed stridor on 4/27 post extubation. Cuff leak present on 4/29, family requests extubation on 5/1.   ASSESSMENT / PLAN:  PULMONARY A: Compromised airway in setting of ICH > improving Post-extubation stridor > seemingly better given lack of cuff leak P:   Plan extubation after family arrives today Stop solumedrol F/u CXR  NEUROLOGIC A:   Lt frontal ICH with SAH and SDH with cytotoxic edema Seizure Brain mass, s/p biopsy P:   Monitor mental status F/u brain bx  AEDs per neurology Hold outpt effexor  CARDIOVASCULAR A:  Hx of NICM after chemotherapy with chronic systolic CHF Hx of LBBB, HTN, HLD Lt leg DVT >> no anticoagulation in setting of ICH/SAH/SDH >> s/p IVC filter 4/26 P:  Goal SBP < 160, prn labetalol Continue coreg, lipitor Continue lasix Hold outpt diovan, corlanor for now Family wants ICD turned off > will call cardiology  RENAL A:   Hypokalemia P:   Monitor renal fx, urine outpt Replace electrolytes as needed  GASTROINTESTINAL A:   Nutrition Hx of GERD  P:   Tube feeds while on vent Continue BID protonix  HEMATOLOGIC A:   Anemia of critical  illness Leukocytosis due to steroids P: F/u CBC SCDs for DVT prevention  INFECTIOUS A:   Fever with E coli UTI >> completed abx 4/28 Coag neg Staph in 1 blood culture >> likely contaminate P:   Monitor off Abx  ENDOCRINE A:   Hx of hypothyroidism, gout P:   Continue synthroid Hold outpt allopurinol  Updated pts wife at bedside on 4/29 and 4/30 and 5/1, see those notes> Ms. Gains is now DNR.  We plan for extubation today without re-intubation.    CC time 33 minutes.  Roselie Awkward, MD [place] [facility] Pager: [phone] Cell: [phone] After 3pm or if no response, call [phone]

## 2015-08-31 NOTE — Progress Notes (Signed)
Patient ID: Sylvia Moore, female   DOB: 1950/09/29, 65 y.o.   MRN: [id number] Laverta [place]. Family to stop aggressive treatment. No path report

## 2015-08-31 NOTE — Progress Notes (Signed)
   08/31/15 1331  Clinical Encounter Type  Visited With Family;Health care provider  Visit Type Follow-up;Critical Care  Referral From Nurse  Spiritual Encounters  Spiritual Needs Emotional  Stress Factors  Family Stress Factors Health changes   Chaplain briefly checked in with patient's wife, Lelon Frohlich. Chaplain offered support, and reminded her of our availability as needed.  Patient and wife have a large support system. Chaplain is available as needed.   Jeri Lager, Chaplain 08/31/2015 1:32 PM

## 2015-08-31 NOTE — Progress Notes (Signed)
STROKE TEAM PROGRESS NOTE   SUBJECTIVE (INTERVAL HISTORY) Her wifer is at the bedside.  Patient still intubated but eyes open and interactive. No significant changes.Plan to extubate today but no reintubation and DNR  OBJECTIVE Temp:  [97.9 F (36.6 C)-99.7 F (37.6 C)] 98.5 F (36.9 C) (05/01 1200) Pulse Rate:  [62-108] 87 (05/01 1233) Cardiac Rhythm:  [-] Normal sinus rhythm (05/01 1200) Resp:  [14-31] 16 (05/01 1233) BP: (120-148)/(63-91) 134/75 mmHg (05/01 1233) SpO2:  [90 %-100 %] 100 % (05/01 1233) FiO2 (%):  [40 %] 40 % (05/01 1200) Weight:  [204 lb 9.4 oz (92.8 kg)] 204 lb 9.4 oz (92.8 kg) (05/01 0500)  CBC:   Recent Labs Lab 08/29/15 0545 08/31/15 0410  WBC 24.5* 20.2*  NEUTROABS  --  18.4*  HGB 8.4* 8.5*  HCT 25.9* 26.5*  MCV 104.4* 105.6*  PLT 843* 921*    Basic Metabolic Panel:   Recent Labs Lab 08/25/15 0540  08/29/15 0545 08/31/15 0410  NA 135  < > 146* 148*  K 3.5  < > 4.0 3.9  CL 101  < > 111 110  CO2 25  < > 26 29  GLUCOSE 103*  < > 144* 127*  BUN 24*  < > 33* 43*  CREATININE 0.62  < > 0.65 0.62  CALCIUM 8.7*  < > 9.4 9.4  MG 1.9  --   --   --   PHOS 3.3  --   --   --   < > = values in this interval not displayed.  Lipid Panel:     Component Value Date/Time   CHOL 123 08/25/2015 0540   TRIG 103 08/25/2015 0540   HDL 21* 08/25/2015 0540   CHOLHDL 5.9 08/25/2015 0540   VLDL 21 08/25/2015 0540   LDLCALC 81 08/25/2015 0540   HgbA1c:  Lab Results  Component Value Date   HGBA1C 5.7* 08/25/2015   Urine Drug Screen:     Component Value Date/Time   LABOPIA NONE DETECTED 08/18/2015 1044   COCAINSCRNUR NONE DETECTED 08/18/2015 1044   LABBENZ NONE DETECTED 08/18/2015 1044   AMPHETMU NONE DETECTED 08/18/2015 1044   THCU NONE DETECTED 08/18/2015 1044   LABBARB NONE DETECTED 08/18/2015 1044      IMAGING I have personally reviewed the radiological images below and agree with the radiology interpretations.  Ct Head Wo  Contrast 08/24/2015 IMPRESSION: 1. Stable moderate subarachnoid hemorrhage on the left. 2. Left MCA territory infarct. Cytotoxic edema results in increased midline shift, now 7 mm. 3. EVD with no ventriculomegaly.  08/19/2015  Interval LEFT frontotemporal craniotomy for evacuation of temporal lobe hematoma and subdural hematoma. Moderate residual LEFT subarachnoid hemorrhage. Trace LEFT tentorial subdural hematoma. Improved, residual 3 mm LEFT to RIGHT midline shift.  08/18/2015   ~ 3cm left frontal parenchymal hematoma with subarachnoid and subdural extension. There may be an underlying mass and CT with and without contrast is recommended. 6 mm midline shift.  08/18/2015  Large area of hemorrhage in the left hemisphere has progressed since 1 hour ago. There is parenchymal hemorrhage with active extravasation in the left frontal operculum. There is also increase in left hemispheric subdural hemorrhage. There is a small amount of subarachnoid hemorrhage in the left sylvian fissure. Increase in low-density edema on the left frontal lobe. Increase in midline shift which is now 11 mm to the right   Ct Angio Head & Neck W/cm &/or Wo/cm 08/18/2015   Negative for vascular malformation. No aneurysm or AVM is  seen to account for the large left frontal temporal hematoma. Large hematoma left frontal temporal lobe is stable since the recent CT scan. Extravasated contrast shows improvement since the prior study. Mild amount of subarachnoid hemorrhage. 6 mm left subdural hematoma is stable. There is increased edema in the left hemisphere with increased mass-effect on the lateral ventricle. 11 mm midline shift is unchanged.   Ct Chest Abdomen Pelvis W Contrast 08/18/2015   1. No evidence of primary malignancy or metastatic disease in the chest or abdomen. 2. 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 3. Low volume chest with bilateral atelectasis. 4. Chronic findings are stable    Dg Chest [facility] 08/19/2015   Increased left basilar subsegmental atelectasis. Stable support apparatus.  08/18/2015  1. New central line in unremarkable position.  No pneumothorax. 2. Low volume chest with basilar atelectasis.  08/18/2015   Bibasilar atelectasis. Tube positions as above.   2D echo - - Left ventricle: The cavity size was mildly dilated. Wall  thickness was normal. Systolic function was moderately to  severely reduced. The estimated ejection fraction was in the  range of 30% to 35%. Diffuse hypokinesis. Doppler parameters are  consistent with restrictive physiology, indicative of decreased  left ventricular diastolic compliance and/or increased left  atrial pressure. - Aortic valve: There was trivial regurgitation. - Mitral valve: There was moderate regurgitation. - Pulmonary arteries: Systolic pressure was moderately increased.  PA peak pressure: 54 mm Hg (S). Impressions: - Moderate to severe global reduction in LV function; restrictive  LV filling; sclerotic aortic valve with trace AI; moderate MR;  mild TR with moderately elevated pulmonary pressure; possible  patent foramen ovale noted on subcostal images.  LE venous doppler - DVT noted in the left soleal vein. No DVT RLE.   PHYSICAL EXAM Middle-aged Caucasian lady again intubated on sedation. Head is nontraumatic. Neck is supple without bruit. Cardiac exam no murmur or gallop. Lungs are clear to auscultation. Distal pulses are well felt. Head dressing. Left eye periorbitall swelling post craniotomy.  Neurological Exam  Patient still intubated but eyes open and tracking but does not follow any commands.aphsic globally Pupils are both 2 mm reactive. Corneal reflexes are present. Fundi cannot be visualized. Facial difficult to assess due to ET tube. Spontaneous movements on left upper and lower extremity. Does not move the right side to painful stimuli except slight movement in the right leg. Purposeful  withdrawal on the left leg and arm pain. Right plantar upgoing left downgoing.  ASSESSMENT/PLAN Ms. CARLINE KINZER is a 65 y.o. female with history of CHF, schwannoma of spinal cord, orthostatic HTN, GERD, L BBB, Hepatitis and Hodgkin's lymphoma presenting with altered mental status and dysarthria.   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 Suspect hemorrhage secondary to right MCA infarct vs. underlying tumor. Pathology pending.   Resultant Right hemiplegia and likely aphasia   Neurosurgery consulted Joya Salm) 4/19 crani with IVC placed, ? Tumor. Path pending   CT abdomen/chest/pelvis no evidence of metastatic disease. 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 L frontal lobe ICH w/ SAH, L SDH. Mass effect with 40mm midline shift  Repeat CT head - decreased midline shift now 64mm, but pattern more consistent with left MCA infarct with hemorrhagic transformation  TTE 30-35% down  from EF 35-40% in 01/2015  Pacemaker interrogation showed no afib  LDL 81  A1C 5.7  subq heparin for VTE prophylaxis    No antithrombotic prior to admission, no antithrombotic now due to [place]  Ongoing aggressive stroke risk factor management  Therapy recommendations:  pending   Disposition:  pending (has a 61 year-old daughter with mental retardation, unable to make decisions)  If source of hemorrhage is tumor, type of tumor will dictate prognosis.  Patient's neurologic condition post extubation will also help guide plans  Chronic CHF  Has been following with Dr. Rayann Heman in clinic  Pacemaker in situ  EF 35-40% in 01/2015  Now EF down to 30-35%  CXR no pulmonary edema  Left LE DVT  Found on LE venous doppler  High risk for anticoagulation due to [place]  IVC filter placed  Respiratory failure  Intubated in  the ED following seizure from [place]  Not tolerating extubate due to stridor  Re-intubated   CCM on board  Wean off as able  May consider trach if not able to wean off vent early next week  Seizure  Left greater than right body involvement  On Keppra 1.5g q 12h  If EEG neg for seizure, will decrease dose  EEG on hold now  Fever, resolved  WBC 20.8->17.8->19.1->18.9->13.2  Blood culture NGTD   UA TNTC   Urine Cx E.Coli  Sputum culture negative  Pt is on zosyn  Hypertension  Stable now  Hyperlipidemia  No statin at home  LDL 81, goal < 70  Add lipitor 10mg    Other Stroke Risk Factors  Former Cigarette smoker, quit smoking 47 years ago   ETOH use  Marijuana use  Obesity, Body mass index is 31.11 kg/(m^2).   Family hx stroke (father)  Other Active Problems  History of Hodgkin's lymphoma  History of hepatitis, type unknown  History of spinal cord schwannoma  Hypothyroidism  Hospital day # 24 I had a long discussion with the bedside with the patient's wife regarding her prognosis which appears guarded given significant aphasia and dense right hemiplegia. The pathology of brain lesion is not yet back from [facility] and is unclear as to when the report will be back. The patient's wife   is clear that patient would not want tracheostomy, prolonged ventilatory support and life in a nursing home.She has made a decision now that patient will not be reintubated and trached and DNr after extubation today This patient is critically ill due to left frontal ICH with mass effect s/p evacuation and EVD, suspicious for brain mass, left DVT, fever and at significant risk of neurological worsening, death form CNS infection, recurrent bleeding, cerebral edema and brain herniation as well as PE. This patient's care requires constant monitoring of vital signs, hemodynamics, respiratory and cardiac monitoring, review of multiple databases, neurological assessment,  discussion with family, other specialists and medical decision making of high complexity. I spent 30 minutes of neurocritical care time in the care of this patient.   Antony Contras, MD Stroke Neurology 08/31/2015 3:43 PM     To contact Stroke Continuity provider, please refer to [email or web address] After hours, contact General Neurology

## 2015-09-01 DIAGNOSIS — I619 Nontraumatic intracerebral hemorrhage, unspecified: Principal | ICD-10-CM

## 2015-09-01 MED ORDER — LORAZEPAM 2 MG/ML IJ SOLN: 2.0000 mg | INTRAMUSCULAR | Status: AC | PRN

## 2015-09-01 MED ORDER — MORPHINE BOLUS VIA INFUSION: 2.0000 mg | INTRAVENOUS | Status: AC | PRN

## 2015-09-01 MED ORDER — SODIUM CHLORIDE 0.9 % IV SOLN: 1500.0000 mg | Freq: Two times a day (BID) | INTRAVENOUS | Status: AC

## 2015-09-01 MED ORDER — MORPHINE SULFATE 25 MG/ML IV SOLN: INTRAVENOUS | Status: AC

## 2015-09-01 MED ORDER — GLYCOPYRROLATE 0.2 MG/ML IJ SOLN: 0.4000 mg | Freq: Four times a day (QID) | INTRAMUSCULAR | Status: AC

## 2015-09-01 NOTE — Progress Notes (Signed)
Nutrition Brief Note  Chart reviewed. Pt now transitioning to comfort care.  No further nutrition interventions warranted at this time.  Please re-consult as needed.   Hakeen Shipes A. Shaneece Stockburger, RD, LDN, CDE Pager: [phone] After hours Pager: [phone]  

## 2015-09-01 NOTE — Consult Note (Signed)
[place] room available this morning. Met with spouse Lelon Frohlich, sister Marcie Bal and Ann's son Rush Landmark to complete paper work. Dr. Orpah Melter to assume care per family request. CSW Randall Hiss has been notified.   Please fax discharge summary to [phone].  RN please leave CVC accessible for use at [facility] and call report to [phone]  Thank You. Erling Conte, [place]

## 2015-09-01 NOTE — Progress Notes (Addendum)
Wasted 264ml of morphine gtt in sink. Witnessed by Acquanetta Chain, RN.    Acquanetta Chain, RN

## 2015-09-01 NOTE — Progress Notes (Signed)
Patient ID: Sylvia Moore, female   DOB: [birth year], 65 y.o.   MRN: [id number] To [facility] today

## 2015-09-01 NOTE — Clinical Social Work Note (Signed)
Clinical Social Work Assessment  Patient Details  Name: Sylvia Moore MRN: [id number] Date of Birth: 08-23-1950  Date of referral:  09/01/15               Reason for consult:  End of Life/Hospice                Permission sought to share information with:  Family Supports Permission granted to share information::  Yes, Verbal Permission Granted  Name::     Martyn Malay [phone]  Agency::  [facility]  Relationship::     Contact Information:     Housing/Transportation Living arrangements for the past 2 months:  Single Family Home Source of Information:  Other (Comment Required) Patient Interpreter Needed:  None Criminal Activity/Legal Involvement Pertinent to Current Situation/Hospitalization:  No - Comment as needed Significant Relationships:  Siblings, Spouse Lives with:  Spouse Do you feel safe going back to the place where you live?  No (Patient is end of life care and plans to go to [facility].) Need for family participation in patient care:  Yes (Comment)  Care giving concerns:  Patient is end of life care   Social Worker assessment / plan:  Patient is a 65 year old female who lives with her spouse.  Patient's brother was at bedside, patient was sleeping and resting.  Patient's spouse and family are in agreement to having patient go to [facility] inpatient hospice facility.  Patient's family have met with [facility] and has completed paperwork.  Patient will be transitioning to [facility] inpatient hospice facility.  Patient's family did not express any other questions or concerns.  Employment status:  Disabled (Comment on whether or not currently receiving Disability) Insurance information:  Managed Care PT Recommendations:    Information / Referral to community resources:     Patient/Family's Response to care:  Patient's family in agreement to going to [facility] facility.  Patient/Family's Understanding of and Emotional Response  to Diagnosis, Current Treatment, and Prognosis:  Patient's family aware of current treatment and prognosis.  Emotional Assessment Appearance:    Attitude/Demeanor/Rapport:  Unable to Assess Affect (typically observed):  Unable to Assess Orientation:   (Unable to assess) Alcohol / Substance use:  Not Applicable Psych involvement (Current and /or in the community):  No (Comment)  Discharge Needs  Concerns to be addressed:  No discharge needs identified Readmission within the last 30 days:  No Current discharge risk:  None Barriers to Discharge:  No Barriers Identified   Ross Ludwig, LCSWA 09/01/2015, 12:12 PM

## 2015-09-01 NOTE — Discharge Summary (Signed)
Physician Discharge Summary       Patient ID: Sylvia Moore MRN: [id number] DOB/AGE: 1951/02/22 65 y.o.  Admit date: 08/18/2015 Discharge date: 09/01/2015  Discharge Diagnoses:  Active Problems:   ICH (intracerebral hemorrhage) (HCC)   Acute respiratory failure with hypoxia (HCC)   Acute respiratory failure (HCC)   Cytotoxic brain edema (HCC)   Brain herniation (HCC)   Postextubation stridor   DNR (do not resuscitate)   Palliative care encounter   Dysphagia   Increased oropharyngeal secretions   Cerebral hemorrhage ([place])   History of Present Illness/ Hospital course:  65 year old female with c/o headache the am of 4/18 followed by aphasia and then somnolence. She presented to the ED as a possible stroke and was taken to the CT scanner under code stroke protocol. While in CT she vomited and then suffered a seizure, which was abated with IV ativan. She was found to have 3cm left frontal parenchymal hematoma with subarachnoid and subdural extension, which worsened on repeat CT. Had witnessed seizure activity by staff while in CT scan. She was intubated for airway protection in the ED.  Admitted to PCCM service, and taken to the OR under Dr. Joya Salm for Left frontotemporal craniotomy, evacuation of the subdural hematoma, evacuation of intercerebral hematoma plus resection of abnormal tissue most likely tumor. Insertion of a intraventricular catheter right frontal horn. She remained on ventilator for some time as mental status would not allow extubation. She was also found to have LLE DVT, but was unable to be anticoagulated in setting ICH. IVC filter placed 4/26 and she was extubated 4/27, but had significant stridor prompting re-intubation. Her mental status was slow to improve and at that time she was not following commands. Family discussion elicited that Sylvia Moore would never want trach or to live in SNF. One way extubation done 5/1 at family request with plans for comfort care. 5/2 deemed a  candidate for placement at inpatient hospice facility [facility].    Discharge Plan by active problems   Lt frontal ICH with SAH and SDH s/p Crani and biopsy Siezures - Transition to full comfort per family wishes.  - Morphine infusion + boluses for comfort - Robinul and zofran for symptom managment - PRN ativan - Continue [facility] tests/ studies  Consults  Neurosurgery Neurology (stroke team) Palliative Care Interventional radiology  Discharge Exam: BP 144/91 mmHg  Pulse 84  Temp(Src) 98 F (36.7 C) (Oral)  Resp 22  Ht 5\' 8"  (1.727 m)  Wt 92.8 kg (204 lb 9.4 oz)  BMI 31.11 kg/m2  SpO2 100%  General: resting comfortably in bed HENT: eyes closed, Surgical areas to L frontal head PULM CTA B CV: RRR, no mgr GI: BS+, hypoactive MSK: normal bulk and tone  Labs at discharge Lab Results  Component Value Date   CREATININE 0.62 08/31/2015   BUN 43* 08/31/2015   NA 148* 08/31/2015   K 3.9 08/31/2015   CL 110 08/31/2015   CO2 29 08/31/2015   Lab Results  Component Value Date   WBC 20.2* 08/31/2015   HGB 8.5* 08/31/2015   HCT 26.5* 08/31/2015   MCV 105.6* 08/31/2015   PLT 921* 08/31/2015   Lab Results  Component Value Date   ALT 26 08/18/2015   AST 36 08/18/2015   ALKPHOS 85 08/18/2015   BILITOT 0.7 08/18/2015   Lab Results  Component Value Date   INR 1.12 08/18/2015   INR 1.15 06/18/2014    Current  radiology studies Dg Chest Port 1 View  08/30/2015  CLINICAL DATA:  Acute respiratory failure with hypoxia. History of pneumonia and Hodgkin's lymphoma. EXAM: PORTABLE CHEST 1 VIEW COMPARISON:  Chest x-rays dated 08/29/2015 and 08/28/2015. FINDINGS: Heart size is upper normal, stable. Overall cardiomediastinal silhouette is stable in size and configuration. Endotracheal tube remains well positioned with tip approximately 2 cm above the carina. Enteric tube passes below the diaphragm. Right IJ central line appears well positioned with  tip at the level of the lower SVC. Study is hypoinspiratory with crowding of the perihilar and bibasilar bronchovascular markings. Dense opacity at the left lung base is likely a combination of atelectasis and small pleural effusion. IMPRESSION: 1. Dense opacity at the left lung base, slightly increased compared to recent exams, most likely a combination of atelectasis and small pleural effusion. Left lower lobe pneumonia cannot be excluded. Suspect additional mild atelectasis at the right lung base. 2. Otherwise stable chest x-ray. Support apparatus remains appropriately positioned. Electronically Signed   By: Franki Cabot M.D.   On: 08/30/2015 15:13    Disposition: Hospice facility    Medication List    STOP taking these medications        allopurinol 100 MG tablet  Commonly known as:  ZYLOPRIM     carvedilol 12.5 MG tablet  Commonly known as:  COREG     CORLANOR 5 MG Tabs tablet  Generic drug:  ivabradine     furosemide 20 MG tablet  Commonly known as:  LASIX     levothyroxine 125 MCG tablet  Commonly known as:  SYNTHROID, LEVOTHROID     MULTIVITAMIN PO     nitroGLYCERIN 0.4 MG SL tablet  Commonly known as:  NITROSTAT     omeprazole 20 MG tablet  Commonly known as:  PRILOSEC OTC     valsartan 40 MG tablet  Commonly known as:  DIOVAN     venlafaxine XR 37.5 MG 24 hr capsule  Commonly known as:  EFFEXOR-XR      TAKE these medications        glycopyrrolate 0.2 MG/ML injection  Commonly known as:  ROBINUL  Inject 2 mLs (0.4 mg total) into the vein 4 (four) times daily.     levETIRAcetam 1,500 mg in sodium chloride 0.9 % 100 mL  Inject 1,500 mg into the vein every 12 (twelve) hours.     LORazepam 2 MG/ML injection  Commonly known as:  ATIVAN  Inject 1 mL (2 mg total) into the vein every 4 (four) hours as needed for anxiety.     morphine 250 mg in sodium chloride 0.9 % 240 mL  Titrate for symptomatic relief and comfort, per [facility] MD     morphine 5 mg/mL  Soln  Inject 2 mg into the vein every 30 (thirty) minutes as needed (Dyspnea, Distress, Pain).         Discharged Condition: poor  Greater than 35 minutes of time have been dedicated to discharge assessment, planning and discharge instructions.   Signed: Georgann Housekeeper, AGACNP-BC  Pulmonology/Critical Care Pager [phone] or [phone]  09/01/2015 12:09 PM  Patient seen and examined, agree with above note.  I dictated the care and orders written for this patient under my direction.  Rush Farmer, MD [phone]

## 2015-09-01 NOTE — Progress Notes (Signed)
Report called to [facility] and given to nurse. All patient belongings sent with family. Patient ready for discharge. [facility] in route to transport patient to facility.

## 2015-09-01 NOTE — Progress Notes (Addendum)
Started morphine drip at 0017 on patient at 1mg /hr (1 mL/hr) per new order from Palliative MD due to family's concern of elevated HR 140-150s despite all PRNs medications (morphine 2mg  Inj.Q1H & Ativan 2mg  Inj Q4H).  Patient able to sleep around 2305 with very minimal noisy labored breathing and family refused patient to be turned Q2H while asleep.[name]

## 2015-09-01 NOTE — Progress Notes (Signed)
STROKE TEAM PROGRESS NOTE   SUBJECTIVE (INTERVAL HISTORY) Her brother is at the bedside.  Patient  Made comfort acre by family and is on morphine drip OBJECTIVE Temp:  [97.9 F (36.6 C)-98.6 F (37 C)] 98 F (36.7 C) (05/02 0557) Pulse Rate:  [84-118] 102 (05/02 1436) Cardiac Rhythm:  [-] Normal sinus rhythm (05/01 2015) Resp:  [22-26] 22 (05/02 0557) BP: (144-167)/(79-95) 146/88 mmHg (05/02 1436) SpO2:  [93 %-100 %] 100 % (05/02 0557)  CBC:   Recent Labs Lab 08/29/15 0545 08/31/15 0410  WBC 24.5* 20.2*  NEUTROABS  --  18.4*  HGB 8.4* 8.5*  HCT 25.9* 26.5*  MCV 104.4* 105.6*  PLT 843* 921*    Basic Metabolic Panel:   Recent Labs Lab 08/29/15 0545 08/31/15 0410  NA 146* 148*  K 4.0 3.9  CL 111 110  CO2 26 29  GLUCOSE 144* 127*  BUN 33* 43*  CREATININE 0.65 0.62  CALCIUM 9.4 9.4    Lipid Panel:     Component Value Date/Time   CHOL 123 08/25/2015 0540   TRIG 103 08/25/2015 0540   HDL 21* 08/25/2015 0540   CHOLHDL 5.9 08/25/2015 0540   VLDL 21 08/25/2015 0540   LDLCALC 81 08/25/2015 0540   HgbA1c:  Lab Results  Component Value Date   HGBA1C 5.7* 08/25/2015   Urine Drug Screen:     Component Value Date/Time   LABOPIA NONE DETECTED 08/18/2015 1044   COCAINSCRNUR NONE DETECTED 08/18/2015 1044   LABBENZ NONE DETECTED 08/18/2015 1044   AMPHETMU NONE DETECTED 08/18/2015 1044   THCU NONE DETECTED 08/18/2015 1044   LABBARB NONE DETECTED 08/18/2015 1044      IMAGING I have personally reviewed the radiological images below and agree with the radiology interpretations.  Ct Head Wo Contrast 08/24/2015 IMPRESSION: 1. Stable moderate subarachnoid hemorrhage on the left. 2. Left MCA territory infarct. Cytotoxic edema results in increased midline shift, now 7 mm. 3. EVD with no ventriculomegaly.  08/19/2015  Interval LEFT frontotemporal craniotomy for evacuation of temporal lobe hematoma and subdural hematoma. Moderate residual LEFT subarachnoid hemorrhage.  Trace LEFT tentorial subdural hematoma. Improved, residual 3 mm LEFT to RIGHT midline shift.  08/18/2015   ~ 3cm left frontal parenchymal hematoma with subarachnoid and subdural extension. There may be an underlying mass and CT with and without contrast is recommended. 6 mm midline shift.  08/18/2015  Large area of hemorrhage in the left hemisphere has progressed since 1 hour ago. There is parenchymal hemorrhage with active extravasation in the left frontal operculum. There is also increase in left hemispheric subdural hemorrhage. There is a small amount of subarachnoid hemorrhage in the left sylvian fissure. Increase in low-density edema on the left frontal lobe. Increase in midline shift which is now 11 mm to the right   Ct Angio Head & Neck W/cm &/or Wo/cm 08/18/2015   Negative for vascular malformation. No aneurysm or AVM is seen to account for the large left frontal temporal hematoma. Large hematoma left frontal temporal lobe is stable since the recent CT scan. Extravasated contrast shows improvement since the prior study. Mild amount of subarachnoid hemorrhage. 6 mm left subdural hematoma is stable. There is increased edema in the left hemisphere with increased mass-effect on the lateral ventricle. 11 mm midline shift is unchanged.   Ct Chest Abdomen Pelvis W Contrast 08/18/2015   1. No evidence of primary malignancy or metastatic disease in the chest or abdomen. 2. 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 3. Low volume chest with bilateral atelectasis. 4. Chronic findings are stable   Dg Chest [facility] 08/19/2015   Increased left basilar subsegmental atelectasis. Stable support apparatus.  08/18/2015  1. New central line in unremarkable position.  No pneumothorax. 2. Low volume chest with basilar atelectasis.  08/18/2015   Bibasilar atelectasis. Tube positions as above.   2D echo - - Left ventricle: The cavity size was mildly dilated. Wall   thickness was normal. Systolic function was moderately to  severely reduced. The estimated ejection fraction was in the  range of 30% to 35%. Diffuse hypokinesis. Doppler parameters are  consistent with restrictive physiology, indicative of decreased  left ventricular diastolic compliance and/or increased left  atrial pressure. - Aortic valve: There was trivial regurgitation. - Mitral valve: There was moderate regurgitation. - Pulmonary arteries: Systolic pressure was moderately increased.  PA peak pressure: 54 mm Hg (S). Impressions: - Moderate to severe global reduction in LV function; restrictive  LV filling; sclerotic aortic valve with trace AI; moderate MR;  mild TR with moderately elevated pulmonary pressure; possible  patent foramen ovale noted on subcostal images.  LE venous doppler - DVT noted in the left soleal vein. No DVT RLE.   PHYSICAL EXAM Middle-aged Caucasian lady   Neck is supple without bruit. Cardiac exam no murmur or gallop. Lungs are clear to auscultation. Distal pulses are well felt. Head dressing. Left eye periorbitall swelling post craniotomy.  Neurological Exam  Patient on morphine drip and looks comfortable. No spontaneous movements.   ASSESSMENT/PLAN Ms. KASHAWNA VILLAVERDE is a 65 y.o. female with history of CHF, schwannoma of spinal cord, orthostatic HTN, GERD, L BBB, Hepatitis and Hodgkin's lymphoma presenting with altered mental status and dysarthria.   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 Suspect hemorrhage secondary to right MCA infarct vs. underlying tumor. Pathology pending.   Resultant Right hemiplegia and likely aphasia   Neurosurgery consulted Joya Salm) 4/19 crani with IVC placed, ? Tumor. Path pending   CT abdomen/chest/pelvis no evidence of metastatic disease. 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 L frontal lobe ICH w/ SAH, L SDH. Mass effect with 61mm midline shift  Repeat CT head - decreased midline shift now 46mm, but pattern more consistent with left MCA infarct with hemorrhagic transformation  TTE 30-35% down from EF 35-40% in 01/2015  Pacemaker interrogation showed no afib  LDL 81  A1C 5.7  subq heparin for VTE prophylaxis    No antithrombotic prior to admission, no antithrombotic now due to [place]  Ongoing aggressive stroke risk factor management  Therapy recommendations:  pending   Disposition:  pending (has a 55 year-old daughter with mental retardation, unable to make decisions)  If source of hemorrhage is tumor, type of tumor will dictate prognosis.  Patient's neurologic condition post extubation will also help guide plans  Chronic CHF  Has been following with Dr. Rayann Heman in clinic  Pacemaker in situ  EF 35-40% in 01/2015  Now EF down to 30-35%  CXR no pulmonary edema  Left LE DVT  Found on LE venous doppler  High risk for anticoagulation due to [place]  IVC filter placed  Respiratory failure  Intubated in the ED following seizure from [place]  Not tolerating extubate due to stridor  Re-intubated   CCM on  board  Wean off as able  May consider trach if not able to wean off vent early next week  Seizure  Left greater than right body involvement  On Keppra 1.5g q 12h  If EEG neg for seizure, will decrease dose  EEG on hold now  Fever, resolved  WBC 20.8->17.8->19.1->18.9->13.2  Blood culture NGTD   UA TNTC   Urine Cx E.Coli  Sputum culture negative  Pt is on zosyn  Hypertension  Stable now  Hyperlipidemia  No statin at home  LDL 81, goal < 70  Add lipitor 10mg    Other Stroke Risk Factors  Former Cigarette smoker, quit smoking 47 years ago   ETOH use  Marijuana use  Obesity, Body mass index is 31.11 kg/(m^2).   Family hx stroke (father)  Other  Active Problems  History of Hodgkin's lymphoma  History of hepatitis, type unknown  History of spinal cord schwannoma  Hypothyroidism  Hospital day # 55 I had a long discussion with the bedside with the patient's brother and answered questions. Family comfortable with decision on comfort care and she will be transferred to [facility] place later today    Antony Contras, MD Stroke Neurology 09/01/2015 1:53 PM     To contact Stroke Continuity provider, please refer to [email or web address] After hours, contact General Neurology

## 2015-09-01 NOTE — Clinical Social Work Note (Signed)
Patient to be d/c'ed today to [facility].  Patient and family agreeable to plans will transport via ems RN to call report to [phone].  Patient's brother was at bedside and notified.  Evette Cristal, MSW, [place]

## 2015-09-15 ENCOUNTER — Encounter (HOSPITAL_COMMUNITY): Payer: Self-pay

## 2015-10-01 DEATH — deceased

## 2016-11-30 IMAGING — US IR US GUIDE VASC ACCESS RIGHT
1 series · 4 of 4 positions shown · IV contrast (agent unspecified)
Comparison: none

CLINICAL DATA: 63-year-old female in need of venous access for CT
scan with contrast. Despite for attempts, peripheral IV access could
not be obtained. Therefore, ultrasound-guided venous access is
required.

EXAM:
IR ULTRASOUND GUIDANCE VASC ACCESS RIGHT
Date: 06/17/2014
PROCEDURE:
1. Ultrasound-guided puncture of right basilic vein with IV
placement
ANESTHESIA/SEDATION:
None required
MEDICATIONS:
TECHNIQUE: Informed consent was obtained from the patient following explanation
of the procedure, risks, benefits and alternatives. The patient
understands, agrees and consents for the procedure. All questions
were addressed. A time out was performed.

[Series 1: ir us guide vasc access right · 4 acquisitions, 4 frames shown]
[im 1/4]
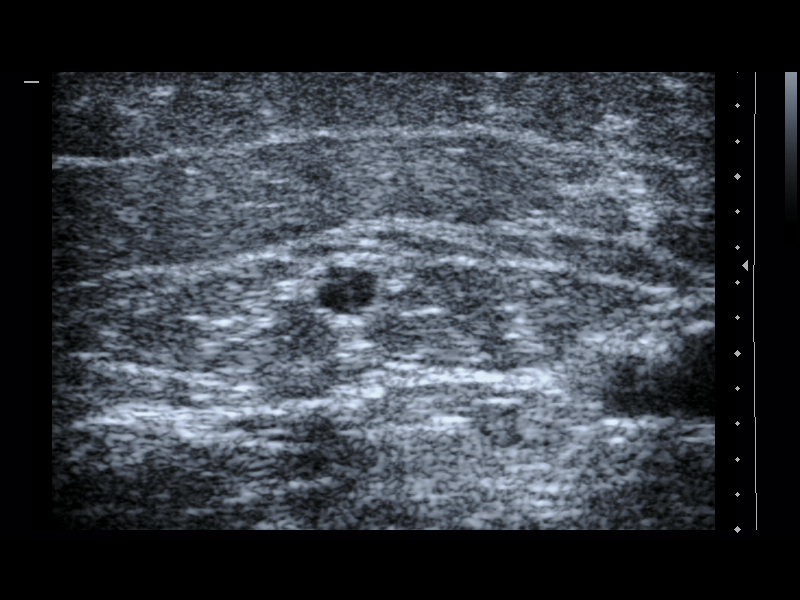
[im 2/4]
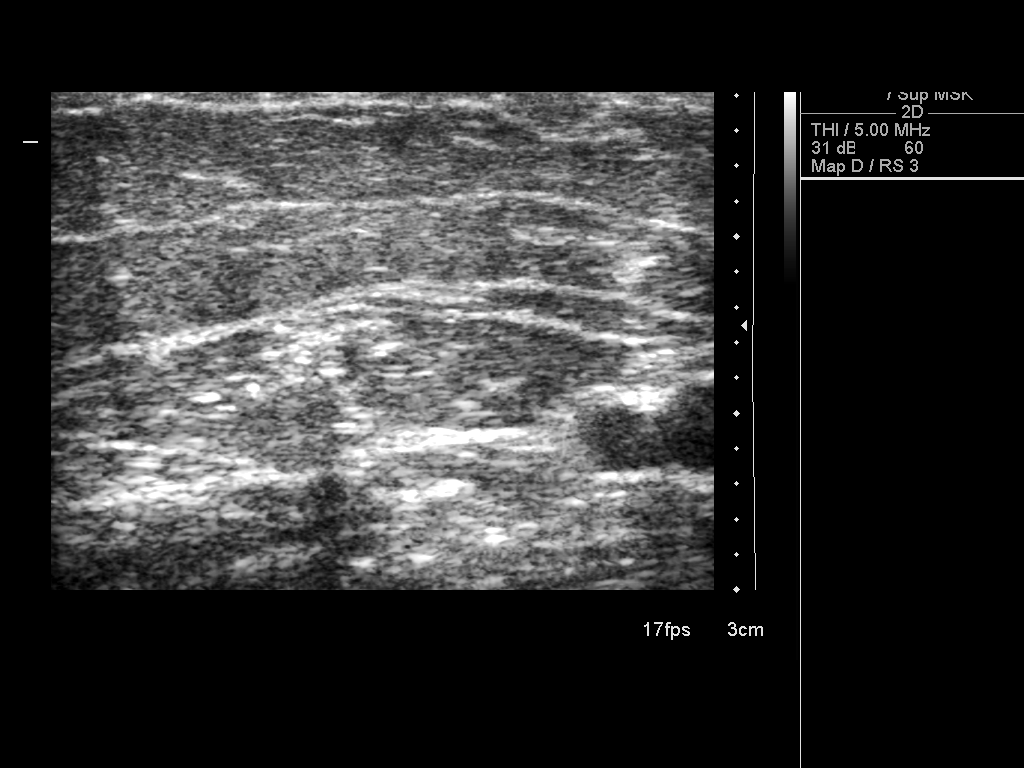
[im 3/4]
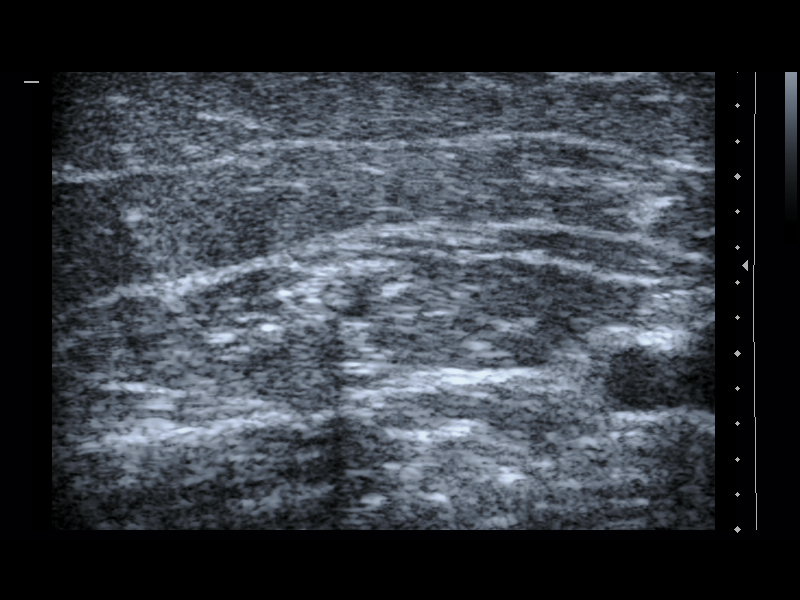
[im 4/4]
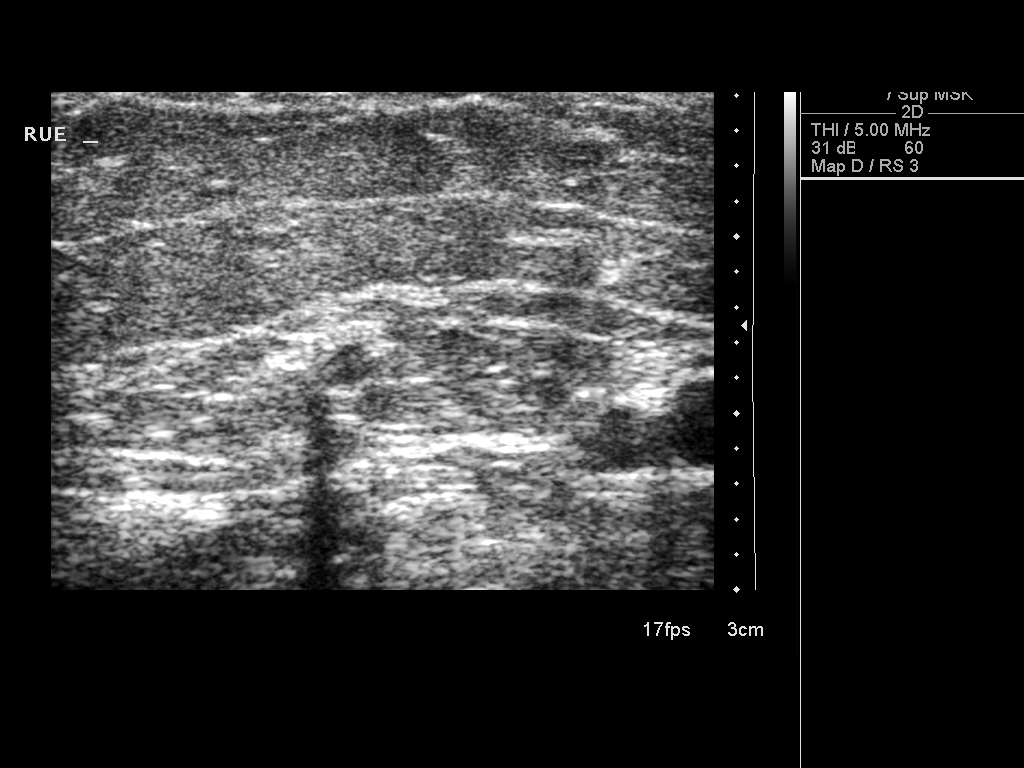

[4 of 4 positions shown; findings below may reference images not displayed]

I tourniquet was applied to the right upper arm. The right upper arm
was interrogated with ultrasound. The basilic vein is widely patent
and compressible. An image was obtained and stored for the medical
record. The skin was then cleansed with alcohol swabs. Under
real-time sonographic guidance, the vein was punctured with a 21
gauge micropuncture needle. An image was obtained and stored for the
medical record. Using standard Seldinger technique, a 5 French micro
sheath was advanced over a 0.018 inch wire and into the right
basilic vein. The small bore sheath flushed and aspirated with ease.
This was secured to the skin and a sterile bandage placed.

COMPLICATIONS:
None
IMPRESSION: Successful ultrasound-guided IV start in the right basilic vein.

## 2016-11-30 IMAGING — CT CT ANGIO CHEST
3 of 6 series · 11 of 30 positions shown · IV contrast ([ID] OMNI [id number])
Comparison: CT chest 07/11/2013

CLINICAL DATA: Short of breath. History Hodgkin's lymphoma 0850.
Chemo and radiation.

EXAM:
CT ANGIOGRAPHY CHEST WITH CONTRAST
TECHNIQUE: Multidetector CT imaging of the chest was performed using the
standard protocol during bolus administration of intravenous
contrast. Multiplanar CT image reconstructions and MIPs were
obtained to evaluate the vascular anatomy.
CONTRAST:  100mL OMNIPAQUE IOHEXOL 350 MG/ML SOLN

[Series 4: pe 1.25 · axial · 0.70mm/px · z∈[-210,-22]mm · 6 of 212 slices shown]
[im 31/212  lung]
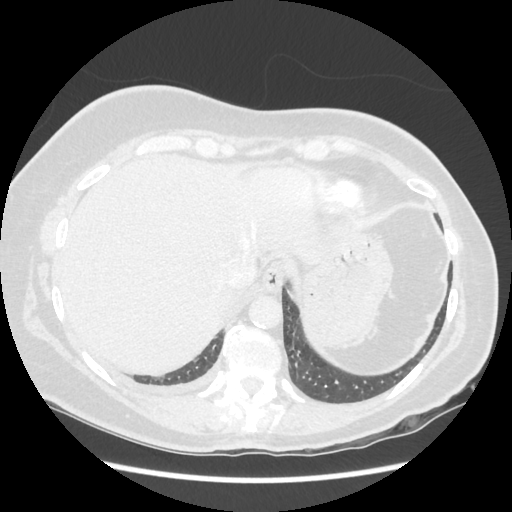
[im 61/212  mediastinal]
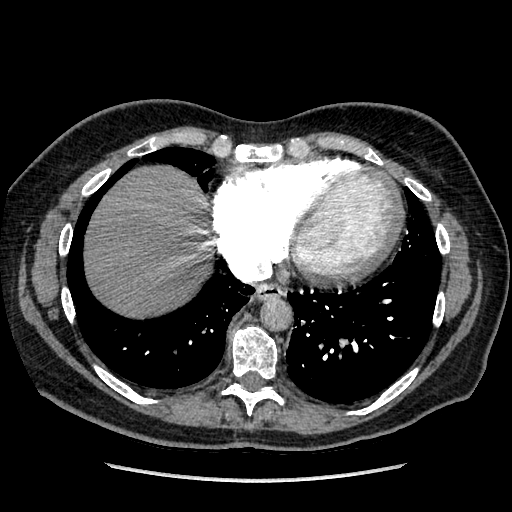
[im 91/212  lung]
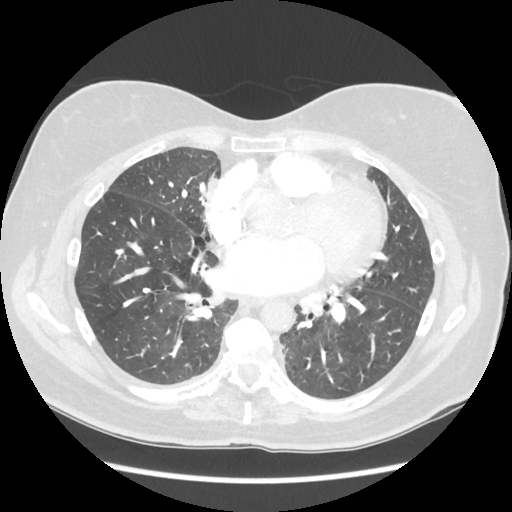
[im 121/212  mediastinal]
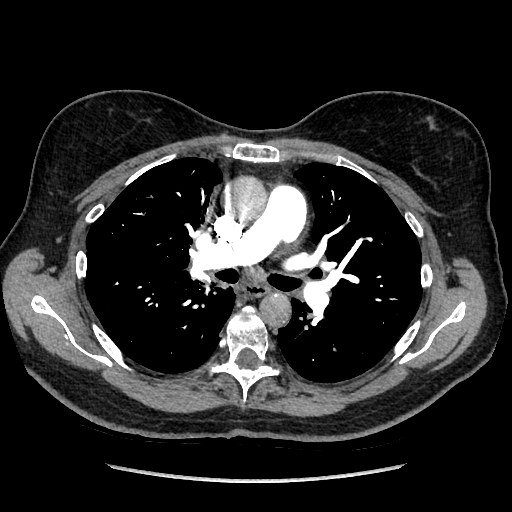
[im 151/212  lung]
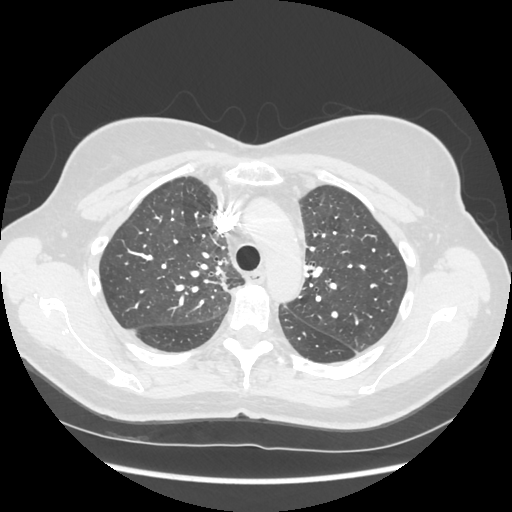
[im 181/212  mediastinal]
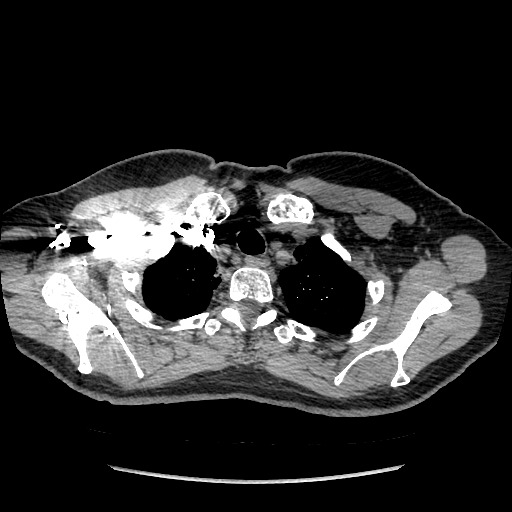

[Series 5: pe 2.5 · axial · 0.70mm/px · z∈[-158,-71]mm · 2 of 106 slices shown]
[im 36/106  lung]
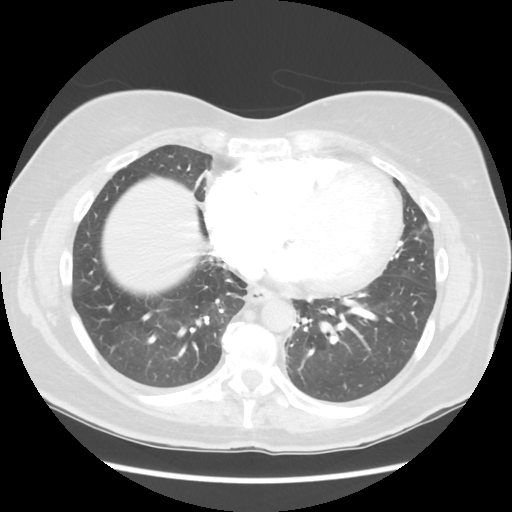
[im 71/106  lung]
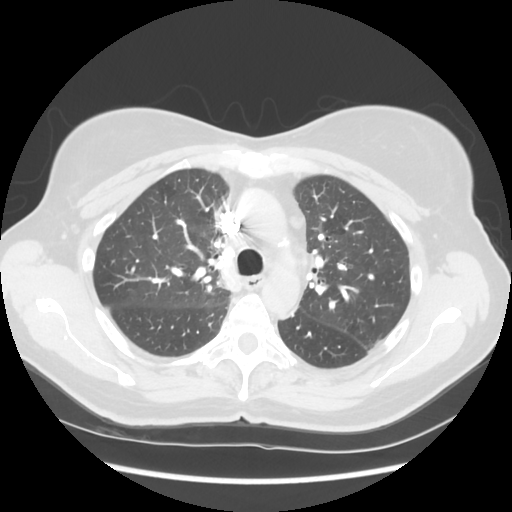

[Series 602: sagittal body · sagittal · 0.70mm/px · 3 of 145 slices shown]
[im 37/145  lung]
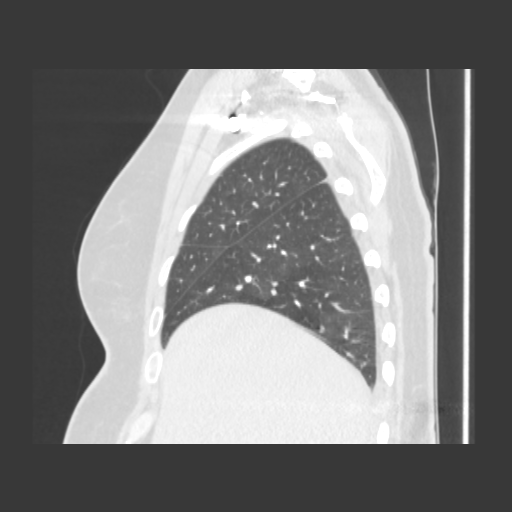
[im 73/145  lung]
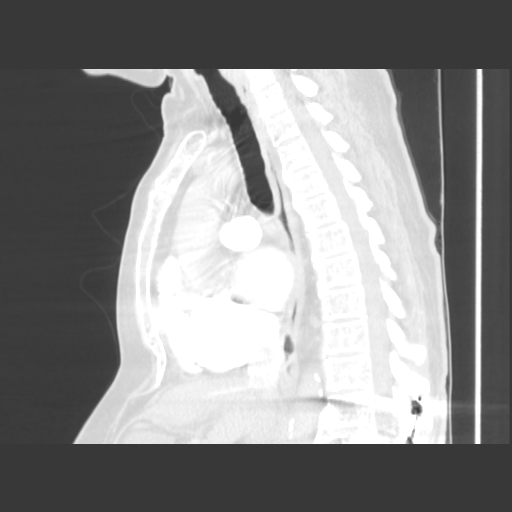
[im 109/145  lung]
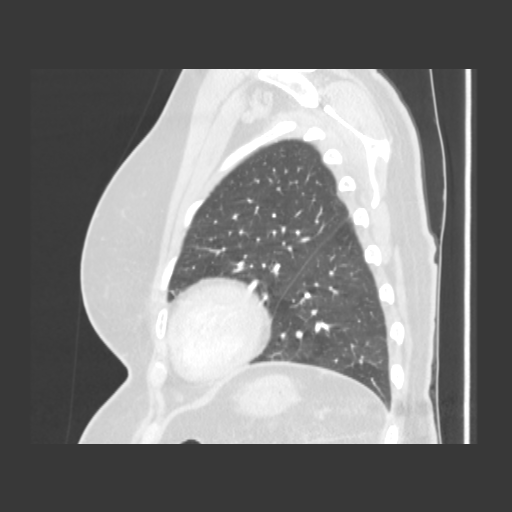

[11 of 30 positions shown; findings below may reference images not displayed]

FINDINGS: Negative for pulmonary embolism. Mild cardiac enlargement. Negative
for pericardial effusion. Mild atherosclerotic aortic arch without
aneurysm. Lack of adequate opacification precludes the ability to
evaluate for aortic dissection.

Subpleural mass in the left posterior apex is unchanged from the
prior study. This measures approximately 16 x 34 mm and has
homogeneous density and does not appear to enhance. This extends
toward the T3-4 foramen which is not enlarged. No bony destruction
or remodeling is present. This is unchanged from a CT of 08/06/2012
and appears stable. This may represent a nerve sheath tumor of an
intercostal nerve or possibly a cyst such is a lateral meningocele.
Density of the mass is similar to that of water.

Scarring in the medial apex bilaterally is stable and consistent
with prior radiation treatment. Negative for pneumonia or effusion.
No adenopathy in the mediastinum.

Review of the MIP images confirms the above findings.
IMPRESSION: Negative for pulmonary embolism.

Subpleural mass in the left apex is unchanged from prior studies and
may represent a thoracic meningocele or a nerve sheath tumor. No new
findings.
